# Patient Record
Sex: Male | Born: 1965
Health system: Southern US, Community
[De-identification: ages and names within clinical notes are randomized; demographics above are authoritative.]

## PROBLEM LIST (undated history)

## (undated) ENCOUNTER — Ambulatory Visit: Admission: EM | Payer: Self-pay | Source: Home / Self Care

## (undated) DIAGNOSIS — E7800 Pure hypercholesterolemia, unspecified: Secondary | ICD-10-CM

## (undated) DIAGNOSIS — I10 Essential (primary) hypertension: Secondary | ICD-10-CM

## (undated) DIAGNOSIS — D509 Iron deficiency anemia, unspecified: Secondary | ICD-10-CM

## (undated) DIAGNOSIS — R739 Hyperglycemia, unspecified: Secondary | ICD-10-CM

## (undated) DIAGNOSIS — K219 Gastro-esophageal reflux disease without esophagitis: Secondary | ICD-10-CM

## (undated) HISTORY — DX: Iron deficiency anemia, unspecified: D50.9

## (undated) HISTORY — PX: INGUINAL HERNIA REPAIR: SUR1180

## (undated) HISTORY — DX: Gastro-esophageal reflux disease without esophagitis: K21.9

## (undated) HISTORY — DX: Hyperglycemia, unspecified: R73.9

## (undated) HISTORY — DX: Essential (primary) hypertension: I10

## (undated) HISTORY — DX: Pure hypercholesterolemia, unspecified: E78.00

---

## 2004-06-30 ENCOUNTER — Emergency Department: Payer: Self-pay | Admitting: Emergency Medicine

## 2004-06-30 ENCOUNTER — Other Ambulatory Visit: Payer: Self-pay

## 2004-09-11 ENCOUNTER — Ambulatory Visit: Payer: Self-pay | Admitting: Internal Medicine

## 2004-09-19 ENCOUNTER — Ambulatory Visit: Payer: Self-pay | Admitting: Internal Medicine

## 2006-01-30 ENCOUNTER — Ambulatory Visit: Payer: Self-pay | Admitting: Rheumatology

## 2006-06-29 ENCOUNTER — Ambulatory Visit: Payer: Self-pay | Admitting: Gastroenterology

## 2011-01-24 ENCOUNTER — Ambulatory Visit: Payer: Self-pay | Admitting: Anesthesiology

## 2011-01-28 ENCOUNTER — Ambulatory Visit: Payer: Self-pay | Admitting: Surgery

## 2011-10-13 ENCOUNTER — Ambulatory Visit: Payer: Self-pay | Admitting: Anesthesiology

## 2011-10-13 LAB — POTASSIUM: Potassium: 3.7 mmol/L (ref 3.5–5.1)

## 2011-10-16 ENCOUNTER — Ambulatory Visit: Payer: Self-pay | Admitting: Orthopedic Surgery

## 2011-10-17 LAB — PATHOLOGY REPORT

## 2012-06-30 ENCOUNTER — Telehealth: Payer: Self-pay | Admitting: Internal Medicine

## 2012-06-30 DIAGNOSIS — R739 Hyperglycemia, unspecified: Secondary | ICD-10-CM

## 2012-06-30 DIAGNOSIS — D649 Anemia, unspecified: Secondary | ICD-10-CM

## 2012-06-30 DIAGNOSIS — I10 Essential (primary) hypertension: Secondary | ICD-10-CM

## 2012-06-30 DIAGNOSIS — E7800 Pure hypercholesterolemia, unspecified: Secondary | ICD-10-CM

## 2012-06-30 NOTE — Telephone Encounter (Signed)
Pt is scheduled for January to come see you but he is needing refills on Oneprazole 20 mg, Metoprolol 25 mg, Benacar 40/12.5, Atrobastatin 40 mg. Pt will need to come pick up Rx's because he uses mail order. Pt also says the he was supposed to come in November for labs. He was wondering should he come here to have those done.

## 2012-06-30 NOTE — Telephone Encounter (Signed)
Ok to refill all of these medications  (#90 with one refill).  Also i have put lab orders in and since his appt is in January - he can come in one week before his appt for labs.  Thanks.

## 2012-07-05 NOTE — Telephone Encounter (Signed)
Left message on machine at home for patient to return call, need clarification on his medications.

## 2012-07-06 MED ORDER — ATORVASTATIN CALCIUM 40 MG PO TABS: 40.0000 mg | ORAL_TABLET | Freq: Every day | ORAL | Status: DC

## 2012-07-06 MED ORDER — OLMESARTAN MEDOXOMIL-HCTZ 40-12.5 MG PO TABS: 1.0000 | ORAL_TABLET | Freq: Every day | ORAL | Status: DC

## 2012-07-06 MED ORDER — METOPROLOL TARTRATE 25 MG PO TABS: 25.0000 mg | ORAL_TABLET | Freq: Every day | ORAL | Status: DC

## 2012-07-06 NOTE — Telephone Encounter (Signed)
Spoke with patients spouse and Rx's were verified.  She will come by to pick up Rx's.

## 2012-07-06 NOTE — Telephone Encounter (Signed)
Pt's wife called back concerning medications I let her know that you called and left message about meds. She was wondering if she could get a call back.  Cell Phone [phone]

## 2012-08-19 ENCOUNTER — Other Ambulatory Visit: Payer: Self-pay | Admitting: General Practice

## 2012-08-19 NOTE — Telephone Encounter (Signed)
Pt called stating he needs all meds refilled with [facility].

## 2012-08-24 MED ORDER — METOPROLOL TARTRATE 25 MG PO TABS: 25.0000 mg | ORAL_TABLET | Freq: Every day | ORAL | Status: DC

## 2012-08-24 MED ORDER — ATORVASTATIN CALCIUM 40 MG PO TABS: 40.0000 mg | ORAL_TABLET | Freq: Every day | ORAL | Status: DC

## 2012-08-24 MED ORDER — OLMESARTAN MEDOXOMIL-HCTZ 40-12.5 MG PO TABS: 1.0000 | ORAL_TABLET | Freq: Every day | ORAL | Status: DC

## 2012-08-24 MED ORDER — OMEPRAZOLE 20 MG PO CPDR: 20.0000 mg | DELAYED_RELEASE_CAPSULE | Freq: Every day | ORAL | Status: DC

## 2012-08-24 NOTE — Addendum Note (Signed)
Addended by: Marlene Lard on: 08/24/2012 12:24 PM   Modules accepted: Orders

## 2012-08-24 NOTE — Telephone Encounter (Signed)
Sent in to pharmacy.  

## 2012-08-31 ENCOUNTER — Telehealth: Payer: Self-pay | Admitting: Internal Medicine

## 2012-08-31 NOTE — Telephone Encounter (Signed)
Patient received his metoprolol in the mail from [facility] and it is not the same medication. They called the pharmacy and the pharmacist told them that one was a time release and the other he use to take was not. The patient wife is wanting to know was something called in different. Call Quintella Baton the patients wife at [phone]

## 2012-09-01 NOTE — Telephone Encounter (Signed)
Spoke with patient regarding meds and reverified name and dosage. Different  manufacture

## 2012-10-01 ENCOUNTER — Other Ambulatory Visit (INDEPENDENT_AMBULATORY_CARE_PROVIDER_SITE_OTHER): Payer: BC Managed Care – PPO

## 2012-10-01 DIAGNOSIS — D649 Anemia, unspecified: Secondary | ICD-10-CM

## 2012-10-01 DIAGNOSIS — R7309 Other abnormal glucose: Secondary | ICD-10-CM

## 2012-10-01 DIAGNOSIS — I10 Essential (primary) hypertension: Secondary | ICD-10-CM

## 2012-10-01 DIAGNOSIS — E7800 Pure hypercholesterolemia, unspecified: Secondary | ICD-10-CM

## 2012-10-01 DIAGNOSIS — R739 Hyperglycemia, unspecified: Secondary | ICD-10-CM

## 2012-10-01 LAB — LIPID PANEL
HDL: 40.8 mg/dL (ref >39.00)
LDL Cholesterol: 103 mg/dL — ABNORMAL HIGH (ref 0–99)
Total CHOL/HDL Ratio: 4
Triglycerides: 87 mg/dL (ref 0.0–149.0)
VLDL: 17.4 mg/dL (ref 0.0–40.0)

## 2012-10-01 LAB — CBC WITH DIFFERENTIAL/PLATELET
Basophils Absolute: 0 10*3/uL (ref 0.0–0.1)
Eosinophils Relative: 2.8 % (ref 0.0–5.0)
Lymphs Abs: 2.4 10*3/uL (ref 0.7–4.0)
MCV: 89.3 fl (ref 78.0–100.0)
Monocytes Absolute: 0.6 10*3/uL (ref 0.1–1.0)
Neutrophils Relative %: 56.5 % (ref 43.0–77.0)
Platelets: 263 10*3/uL (ref 150.0–400.0)
RDW: 13.6 % (ref 11.5–14.6)
WBC: 7.5 10*3/uL (ref 4.5–10.5)

## 2012-10-01 LAB — HEPATIC FUNCTION PANEL
Alkaline Phosphatase: 70 U/L (ref 39–117)
Bilirubin, Direct: 0.1 mg/dL (ref 0.0–0.3)
Total Bilirubin: 1.1 mg/dL (ref 0.3–1.2)
Total Protein: 7.4 g/dL (ref 6.0–8.3)

## 2012-10-01 LAB — HEMOGLOBIN A1C: Hgb A1c MFr Bld: 6.8 % — ABNORMAL HIGH (ref 4.6–6.5)

## 2012-10-02 LAB — BASIC METABOLIC PANEL WITH GFR
Calcium: 9.2 mg/dL (ref 8.4–10.5)
GFR, Est African American: >89 mL/min
GFR, Est Non African American: >89 mL/min
Glucose, Bld: 104 mg/dL — ABNORMAL HIGH (ref 70–99)
Potassium: 3.8 mEq/L (ref 3.5–5.3)
Sodium: 136 mEq/L (ref 135–145)

## 2012-10-08 ENCOUNTER — Encounter: Payer: Self-pay | Admitting: Internal Medicine

## 2012-10-08 ENCOUNTER — Ambulatory Visit (INDEPENDENT_AMBULATORY_CARE_PROVIDER_SITE_OTHER): Payer: BC Managed Care – PPO | Admitting: Internal Medicine

## 2012-10-08 VITALS — BP 110/70 | HR 68 | Temp 98.7°F | Ht 69.5 in | Wt 240.8 lb

## 2012-10-08 DIAGNOSIS — E119 Type 2 diabetes mellitus without complications: Secondary | ICD-10-CM | POA: Insufficient documentation

## 2012-10-08 DIAGNOSIS — E7800 Pure hypercholesterolemia, unspecified: Secondary | ICD-10-CM

## 2012-10-08 DIAGNOSIS — R739 Hyperglycemia, unspecified: Secondary | ICD-10-CM

## 2012-10-08 DIAGNOSIS — D649 Anemia, unspecified: Secondary | ICD-10-CM

## 2012-10-08 DIAGNOSIS — R7309 Other abnormal glucose: Secondary | ICD-10-CM

## 2012-10-08 DIAGNOSIS — I10 Essential (primary) hypertension: Secondary | ICD-10-CM

## 2012-10-10 ENCOUNTER — Encounter: Payer: Self-pay | Admitting: Internal Medicine

## 2012-10-10 NOTE — Assessment & Plan Note (Signed)
Low cholesterol diet and exercise.  Continue lipitor.  Follow lipid profile and liver function.  Cholesterol just checked and revealed total cholesterol 161, triglycerides 87, HDL 41 and LDL 103.

## 2012-10-10 NOTE — Assessment & Plan Note (Signed)
Hgb just checked and wnl.  EGD 10/30/05 normal.  Colonoscopy 10/30/05 normal.  Follow.    

## 2012-10-10 NOTE — Assessment & Plan Note (Signed)
A1c just checked - 6.8.  Discussed at length with him today.  Discussed diet, exercise and weight loss.  Hold on medication.  Follow.  Check metabolic panel and a1c as well as urine microalbumin/cr ratio next labs.

## 2012-10-10 NOTE — Assessment & Plan Note (Signed)
Blood pressure has been under good control.  Same meds.  Follow.  Recent metabolic panel wnl.   

## 2012-10-10 NOTE — Progress Notes (Signed)
  Subjective:    Patient ID: Aaron Allison, male    DOB: [birth year], 47 y.o.   MRN: [id number]  HPI 47 year old male with past history of hypertension, diabetes, hypercholesterolemia and iron deficient anemia who comes in today to follow up on these issues as well as for a complete physical exam.  He states he has been doing well.  Had some issues with the ganglion cyst - right wrist.  No problem now.  Following.  Did see Dr Lonna Cobb.  Had prostate biopsy.  Negative.  He is following his prostate and PSA.  Has follow up planned in 4/14.  No chest pain or tightness.  Reflux controlled if he takes his meds and watches what he eats.  Overall he feels he is doing well.    Past Medical History  Diagnosis Date  . Hypertension   . Hypercholesterolemia   . Iron deficiency anemia   . Hyperglycemia   . GERD (gastroesophageal reflux disease)     Current Outpatient Prescriptions on File Prior to Visit  Medication Sig Dispense Refill  . atorvastatin (LIPITOR) 40 MG tablet Take 1 tablet (40 mg total) by mouth daily.  90 tablet  1  . ferrous sulfate 325 (65 FE) MG tablet Take 325 mg by mouth daily.      . metoprolol succinate (TOPROL-XL) 25 MG 24 hr tablet Take 25 mg by mouth daily.      Marland Kitchen olmesartan-hydrochlorothiazide (BENICAR HCT) 40-12.5 MG per tablet Take 1 tablet by mouth daily.  90 tablet  1  . omeprazole (PRILOSEC) 20 MG capsule Take 1 capsule (20 mg total) by mouth daily.  90 capsule  1    Review of Systems Patient denies any headache, lightheadedness or dizziness.  No sinus or allergy symptoms.   No chest pain, tightness or palpitations.  No increased shortness of breath, cough or congestion.  No nausea or vomiting. Acid reflux controlled as outlined.  No abdominal pain or cramping.  No bowel change, such as diarrhea, constipation, BRBPR or melana.  No urine change.        Objective:   Physical Exam Filed Vitals:   10/08/12 1327  BP: 110/70  Pulse: 68  Temp: 98.7 F (69.71 C)   47 year old  male in no acute distress.  HEENT:  Nares - clear.  Oropharynx - without lesions. NECK:  Supple.  Nontender.  No audible carotid bruit.  HEART:  Appears to be regular.   LUNGS:  No crackles or wheezing audible.  Respirations even and unlabored.   RADIAL PULSE:  Equal bilaterally.  ABDOMEN:  Soft.  Nontender.  Bowel sounds present and normal.  No audible abdominal bruit.  GU:  Performed through urology.    EXTREMITIES:  No increased edema present.  DP pulses palpable and equal bilaterally.         Assessment & Plan:  GANGLION CYST.  Stable currently.  Follow.    ELEVATED PSA.  Biopsy negative.  Seeing Dr Lonna Cobb.  Has follow up in 4/14.   CARDIOVASCULAR.  Asymptomatic.    HEALTH MAINTENANCE.  Physical today.  Prostate and PSAs through Dr Lonna Cobb.  Colonoscopy 10/30/05 normal.

## 2013-03-01 ENCOUNTER — Other Ambulatory Visit: Payer: Self-pay | Admitting: *Deleted

## 2013-03-01 MED ORDER — METOPROLOL SUCCINATE ER 25 MG PO TB24: 25.0000 mg | ORAL_TABLET | Freq: Every day | ORAL | Status: DC

## 2013-03-01 MED ORDER — ATORVASTATIN CALCIUM 40 MG PO TABS: 40.0000 mg | ORAL_TABLET | Freq: Every day | ORAL | Status: DC

## 2013-03-01 MED ORDER — OMEPRAZOLE 20 MG PO CPDR: 20.0000 mg | DELAYED_RELEASE_CAPSULE | Freq: Every day | ORAL | Status: DC

## 2013-03-02 ENCOUNTER — Other Ambulatory Visit: Payer: Self-pay | Admitting: *Deleted

## 2013-03-02 MED ORDER — OLMESARTAN MEDOXOMIL-HCTZ 40-12.5 MG PO TABS: 1.0000 | ORAL_TABLET | Freq: Every day | ORAL | Status: DC

## 2013-03-30 ENCOUNTER — Other Ambulatory Visit (INDEPENDENT_AMBULATORY_CARE_PROVIDER_SITE_OTHER): Payer: BC Managed Care – PPO

## 2013-03-30 DIAGNOSIS — E7800 Pure hypercholesterolemia, unspecified: Secondary | ICD-10-CM

## 2013-03-30 DIAGNOSIS — R739 Hyperglycemia, unspecified: Secondary | ICD-10-CM

## 2013-03-30 DIAGNOSIS — R7309 Other abnormal glucose: Secondary | ICD-10-CM

## 2013-03-30 DIAGNOSIS — I10 Essential (primary) hypertension: Secondary | ICD-10-CM

## 2013-03-30 LAB — HEPATIC FUNCTION PANEL
ALT: 26 U/L (ref 0–53)
AST: 23 U/L (ref 0–37)
Total Bilirubin: 0.9 mg/dL (ref 0.3–1.2)
Total Protein: 7.7 g/dL (ref 6.0–8.3)

## 2013-03-30 LAB — LIPID PANEL
Cholesterol: 146 mg/dL (ref 0–200)
HDL: 39 mg/dL — ABNORMAL LOW (ref >39.00)
Triglycerides: 136 mg/dL (ref 0.0–149.0)
VLDL: 27.2 mg/dL (ref 0.0–40.0)

## 2013-03-30 LAB — BASIC METABOLIC PANEL
Chloride: 104 mEq/L (ref 96–112)
GFR: 77.59 mL/min (ref >60.00)
Potassium: 4.2 mEq/L (ref 3.5–5.1)
Sodium: 141 mEq/L (ref 135–145)

## 2013-03-30 LAB — HEMOGLOBIN A1C: Hgb A1c MFr Bld: 6.8 % — ABNORMAL HIGH (ref 4.6–6.5)

## 2013-04-11 ENCOUNTER — Encounter: Payer: Self-pay | Admitting: Internal Medicine

## 2013-04-11 ENCOUNTER — Ambulatory Visit (INDEPENDENT_AMBULATORY_CARE_PROVIDER_SITE_OTHER): Payer: BC Managed Care – PPO | Admitting: Internal Medicine

## 2013-04-11 VITALS — BP 120/80 | HR 82 | Temp 98.7°F | Ht 69.5 in | Wt 244.0 lb

## 2013-04-11 DIAGNOSIS — I10 Essential (primary) hypertension: Secondary | ICD-10-CM

## 2013-04-11 DIAGNOSIS — D649 Anemia, unspecified: Secondary | ICD-10-CM

## 2013-04-11 DIAGNOSIS — E119 Type 2 diabetes mellitus without complications: Secondary | ICD-10-CM

## 2013-04-11 DIAGNOSIS — E7800 Pure hypercholesterolemia, unspecified: Secondary | ICD-10-CM

## 2013-04-11 MED ORDER — METOPROLOL SUCCINATE ER 25 MG PO TB24: 25.0000 mg | ORAL_TABLET | Freq: Every day | ORAL | Status: DC

## 2013-04-11 MED ORDER — OLMESARTAN MEDOXOMIL-HCTZ 40-12.5 MG PO TABS: 1.0000 | ORAL_TABLET | Freq: Every day | ORAL | Status: DC

## 2013-04-11 MED ORDER — OMEPRAZOLE 20 MG PO CPDR: 20.0000 mg | DELAYED_RELEASE_CAPSULE | Freq: Every day | ORAL | Status: DC

## 2013-04-11 MED ORDER — ATORVASTATIN CALCIUM 40 MG PO TABS: 40.0000 mg | ORAL_TABLET | Freq: Every day | ORAL | Status: DC

## 2013-04-13 ENCOUNTER — Encounter: Payer: Self-pay | Admitting: Internal Medicine

## 2013-04-13 NOTE — Assessment & Plan Note (Addendum)
A1c just checked - 6.8.  Discussed at length with him today.  Discussed diet, exercise and weight loss.  Hold on medication.  Follow.

## 2013-04-13 NOTE — Progress Notes (Signed)
Subjective:    Patient ID: Aaron Allison, male    DOB: 31-Dec-1965, 47 y.o.   MRN: [id number]  HPI 47 year old male with past history of hypertension, diabetes, hypercholesterolemia and iron deficient anemia who comes in today for a scheduled follow up.  He states he has been doing well.  Had some issues with the ganglion cyst - right wrist.  No problem now.  Following.  Did see Dr Lonna Cobb.  Had prostate biopsy.  Negative.  He is following his prostate and PSA.   No chest pain or tightness.  Reflux controlled if he takes his meds and watches what he eats.  Overall he feels he is doing well.     Past Medical History  Diagnosis Date  . Hypertension   . Hypercholesterolemia   . Iron deficiency anemia   . Hyperglycemia   . GERD (gastroesophageal reflux disease)     Outpatient Encounter Prescriptions as of 04/11/2013  Medication Sig Dispense Refill  . aspirin 81 MG tablet Take 81 mg by mouth daily.      Marland Kitchen atorvastatin (LIPITOR) 40 MG tablet Take 1 tablet (40 mg total) by mouth daily.  90 tablet  3  . ferrous sulfate 325 (65 FE) MG tablet Take 325 mg by mouth daily.      . metoprolol succinate (TOPROL-XL) 25 MG 24 hr tablet Take 1 tablet (25 mg total) by mouth daily.  90 tablet  3  . olmesartan-hydrochlorothiazide (BENICAR HCT) 40-12.5 MG per tablet Take 1 tablet by mouth daily.  90 tablet  3  . omeprazole (PRILOSEC) 20 MG capsule Take 1 capsule (20 mg total) by mouth daily.  90 capsule  3  . [DISCONTINUED] atorvastatin (LIPITOR) 40 MG tablet Take 1 tablet (40 mg total) by mouth daily.  90 tablet  0  . [DISCONTINUED] metoprolol succinate (TOPROL-XL) 25 MG 24 hr tablet Take 1 tablet (25 mg total) by mouth daily.  90 tablet  0  . [DISCONTINUED] olmesartan-hydrochlorothiazide (BENICAR HCT) 40-12.5 MG per tablet Take 1 tablet by mouth daily.  90 tablet  1  . [DISCONTINUED] omeprazole (PRILOSEC) 20 MG capsule Take 1 capsule (20 mg total) by mouth daily.  90 capsule  0  . [DISCONTINUED] fish oil-omega-3  fatty acids 1000 MG capsule Take 2 g by mouth daily.       No facility-administered encounter medications on file as of 04/11/2013.     Review of Systems Patient denies any headache, lightheadedness or dizziness.  No sinus or allergy symptoms.   No chest pain, tightness or palpitations.  No increased shortness of breath, cough or congestion.  No nausea or vomiting. Acid reflux controlled as outlined.  No abdominal pain or cramping.  No bowel change, such as diarrhea, constipation, BRBPR or melana.  No urine change.        Objective:   Physical Exam  Filed Vitals:   04/11/13 1618  BP: 120/80  Pulse: 82  Temp: 98.7 F (54.28 C)   47 year old male in no acute distress.  HEENT:  Nares - clear.  Oropharynx - without lesions. NECK:  Supple.  Nontender.  No audible carotid bruit.  HEART:  Appears to be regular.   LUNGS:  No crackles or wheezing audible.  Respirations even and unlabored.   RADIAL PULSE:  Equal bilaterally.  ABDOMEN:  Soft.  Nontender.  Bowel sounds present and normal.  No audible abdominal bruit.     EXTREMITIES:  No increased edema present.  DP pulses palpable and  equal bilaterally.         Assessment & Plan:  GANGLION CYST.  Stable currently.  Follow.    ELEVATED PSA.  Biopsy negative.  Seeing Dr Lonna Cobb.    CARDIOVASCULAR.  Asymptomatic.    HEALTH MAINTENANCE.  Physical 10/08/12.   Prostate and PSAs through Dr Lonna Cobb.  Colonoscopy 10/30/05 normal.

## 2013-04-13 NOTE — Assessment & Plan Note (Signed)
Blood pressure has been under good control.  Same meds.  Follow.  Recent metabolic panel wnl.   

## 2013-04-13 NOTE — Assessment & Plan Note (Signed)
Low cholesterol diet and exercise.  Continue lipitor.  Follow lipid profile and liver function.  Cholesterol just checked and revealed total cholesterol 146, triglycerides1367, HDL 39 and LDL 80.

## 2013-04-13 NOTE — Assessment & Plan Note (Signed)
Hgb just checked and wnl.  EGD 10/30/05 normal.  Colonoscopy 10/30/05 normal.  Follow.    

## 2013-04-21 ENCOUNTER — Encounter: Payer: Self-pay | Admitting: Internal Medicine

## 2013-05-06 ENCOUNTER — Encounter: Payer: Self-pay | Admitting: *Deleted

## 2013-09-28 ENCOUNTER — Other Ambulatory Visit (INDEPENDENT_AMBULATORY_CARE_PROVIDER_SITE_OTHER): Payer: BC Managed Care – PPO

## 2013-09-28 DIAGNOSIS — E7800 Pure hypercholesterolemia, unspecified: Secondary | ICD-10-CM

## 2013-09-28 DIAGNOSIS — D649 Anemia, unspecified: Secondary | ICD-10-CM

## 2013-09-28 DIAGNOSIS — E119 Type 2 diabetes mellitus without complications: Secondary | ICD-10-CM

## 2013-09-28 LAB — CBC WITH DIFFERENTIAL/PLATELET
Basophils Relative: 0.5 % (ref 0.0–3.0)
Eosinophils Absolute: 0.2 10*3/uL (ref 0.0–0.7)
Eosinophils Relative: 3.2 % (ref 0.0–5.0)
Hemoglobin: 14.4 g/dL (ref 13.0–17.0)
Lymphocytes Relative: 30.5 % (ref 12.0–46.0)
Lymphs Abs: 2.3 10*3/uL (ref 0.7–4.0)
MCHC: 33.8 g/dL (ref 30.0–36.0)
Monocytes Relative: 6.8 % (ref 3.0–12.0)
Neutro Abs: 4.5 10*3/uL (ref 1.4–7.7)
Neutrophils Relative %: 59 % (ref 43.0–77.0)
RBC: 4.79 Mil/uL (ref 4.22–5.81)
WBC: 7.7 10*3/uL (ref 4.5–10.5)

## 2013-09-28 LAB — LIPID PANEL
Cholesterol: 157 mg/dL (ref 0–200)
HDL: 37.1 mg/dL — ABNORMAL LOW (ref >39.00)
Triglycerides: 145 mg/dL (ref 0.0–149.0)

## 2013-09-28 LAB — HEMOGLOBIN A1C: Hgb A1c MFr Bld: 6.9 % — ABNORMAL HIGH (ref 4.6–6.5)

## 2013-09-28 LAB — BASIC METABOLIC PANEL
CO2: 29 mEq/L (ref 19–32)
Calcium: 9.1 mg/dL (ref 8.4–10.5)
Creatinine, Ser: 1 mg/dL (ref 0.4–1.5)
GFR: 83.65 mL/min (ref >60.00)
Sodium: 140 mEq/L (ref 135–145)

## 2013-09-28 LAB — HEPATIC FUNCTION PANEL
ALT: 32 U/L (ref 0–53)
AST: 25 U/L (ref 0–37)
Albumin: 3.9 g/dL (ref 3.5–5.2)
Alkaline Phosphatase: 60 U/L (ref 39–117)

## 2013-09-30 ENCOUNTER — Encounter: Payer: Self-pay | Admitting: *Deleted

## 2013-10-07 ENCOUNTER — Other Ambulatory Visit: Payer: BC Managed Care – PPO

## 2013-10-14 ENCOUNTER — Encounter (INDEPENDENT_AMBULATORY_CARE_PROVIDER_SITE_OTHER): Payer: Self-pay

## 2013-10-14 ENCOUNTER — Encounter: Payer: Self-pay | Admitting: Internal Medicine

## 2013-10-14 ENCOUNTER — Ambulatory Visit (INDEPENDENT_AMBULATORY_CARE_PROVIDER_SITE_OTHER): Payer: BC Managed Care – PPO | Admitting: Internal Medicine

## 2013-10-14 VITALS — BP 122/80 | HR 98 | Temp 98.5°F | Ht 69.5 in | Wt 242.8 lb

## 2013-10-14 DIAGNOSIS — Z125 Encounter for screening for malignant neoplasm of prostate: Secondary | ICD-10-CM

## 2013-10-14 DIAGNOSIS — D649 Anemia, unspecified: Secondary | ICD-10-CM

## 2013-10-14 DIAGNOSIS — E7800 Pure hypercholesterolemia, unspecified: Secondary | ICD-10-CM

## 2013-10-14 DIAGNOSIS — I10 Essential (primary) hypertension: Secondary | ICD-10-CM

## 2013-10-14 DIAGNOSIS — E119 Type 2 diabetes mellitus without complications: Secondary | ICD-10-CM

## 2013-10-14 MED ORDER — OMEPRAZOLE 20 MG PO CPDR: 20.0000 mg | DELAYED_RELEASE_CAPSULE | Freq: Every day | ORAL | Status: DC

## 2013-10-14 MED ORDER — OLMESARTAN MEDOXOMIL-HCTZ 40-12.5 MG PO TABS: 1.0000 | ORAL_TABLET | Freq: Every day | ORAL | Status: DC

## 2013-10-14 MED ORDER — METOPROLOL SUCCINATE ER 25 MG PO TB24: 25.0000 mg | ORAL_TABLET | Freq: Every day | ORAL | Status: DC

## 2013-10-14 MED ORDER — ATORVASTATIN CALCIUM 40 MG PO TABS: 40.0000 mg | ORAL_TABLET | Freq: Every day | ORAL | Status: DC

## 2013-10-14 NOTE — Progress Notes (Signed)
Pre-visit discussion using our clinic review tool. No additional management support is needed unless otherwise documented below in the visit note.  

## 2013-10-15 LAB — PSA: PSA: 1.56 ng/mL (ref <=4.00)

## 2013-10-16 ENCOUNTER — Encounter: Payer: Self-pay | Admitting: Internal Medicine

## 2013-10-16 NOTE — Assessment & Plan Note (Signed)
Hgb just checked and wnl.  EGD 10/30/05 normal.  Colonoscopy 10/30/05 normal.  Follow.

## 2013-10-16 NOTE — Assessment & Plan Note (Signed)
Low cholesterol diet and exercise.  Continue lipitor.  Follow lipid profile and liver function.    

## 2013-10-16 NOTE — Assessment & Plan Note (Signed)
Blood pressure has been under good control.  Same meds.  Follow.  Recent metabolic panel wnl.

## 2013-10-16 NOTE — Progress Notes (Signed)
Subjective:    Patient ID: Aaron Allison, male    DOB: [birth year], 48 y.o.   MRN: [id number]  HPI 48 year old male with past history of hypertension, diabetes, hypercholesterolemia and iron deficient anemia who comes in today to follow up on these issues as well as for a complete physical exam.   He states he has been doing well.  Had some issues with the ganglion cyst - right wrist.  No problem now.  Following.  Did see Dr Lonna Cobb.  Had prostate biopsy.  Negative.  He was following his prostate and PSA.   He has not returned for prostate check.  No chest pain or tightness.  Reflux controlled if he takes his meds and watches what he eats.  Overall he feels he is doing well.  Recent a1c increasing gradually.  Last check 6.9.  Since finding out about his labs, he has been doing better watching what he eats.  Started exercising again.      Past Medical History  Diagnosis Date  . Hypertension   . Hypercholesterolemia   . Iron deficiency anemia   . Hyperglycemia   . GERD (gastroesophageal reflux disease)     Outpatient Encounter Prescriptions as of 10/14/2013  Medication Sig  . aspirin 81 MG tablet Take 81 mg by mouth daily.  Marland Kitchen atorvastatin (LIPITOR) 40 MG tablet Take 1 tablet (40 mg total) by mouth daily.  . ferrous sulfate 325 (65 FE) MG tablet Take 325 mg by mouth daily.  . metoprolol succinate (TOPROL-XL) 25 MG 24 hr tablet Take 1 tablet (25 mg total) by mouth daily.  Marland Kitchen olmesartan-hydrochlorothiazide (BENICAR HCT) 40-12.5 MG per tablet Take 1 tablet by mouth daily.  Marland Kitchen omeprazole (PRILOSEC) 20 MG capsule Take 1 capsule (20 mg total) by mouth daily.  . [DISCONTINUED] atorvastatin (LIPITOR) 40 MG tablet Take 1 tablet (40 mg total) by mouth daily.  . [DISCONTINUED] atorvastatin (LIPITOR) 40 MG tablet Take 1 tablet (40 mg total) by mouth daily.  . [DISCONTINUED] metoprolol succinate (TOPROL-XL) 25 MG 24 hr tablet Take 1 tablet (25 mg total) by mouth daily.  . [DISCONTINUED] metoprolol succinate  (TOPROL-XL) 25 MG 24 hr tablet Take 1 tablet (25 mg total) by mouth daily.  . [DISCONTINUED] olmesartan-hydrochlorothiazide (BENICAR HCT) 40-12.5 MG per tablet Take 1 tablet by mouth daily.  . [DISCONTINUED] olmesartan-hydrochlorothiazide (BENICAR HCT) 40-12.5 MG per tablet Take 1 tablet by mouth daily.  . [DISCONTINUED] omeprazole (PRILOSEC) 20 MG capsule Take 1 capsule (20 mg total) by mouth daily.  . [DISCONTINUED] omeprazole (PRILOSEC) 20 MG capsule Take 1 capsule (20 mg total) by mouth daily.     Review of Systems Patient denies any headache, lightheadedness or dizziness.  No sinus or allergy symptoms.   No chest pain, tightness or palpitations.  No increased shortness of breath, cough or congestion.  No nausea or vomiting. Acid reflux controlled as outlined.  No abdominal pain or cramping.  No bowel change, such as diarrhea, constipation, BRBPR or melana.  No urine change.  Am sugars averaging 130-140 and pm sugars averaging 140-150s.        Objective:   Physical Exam  Filed Vitals:   10/14/13 1522  BP: 122/80  Pulse: 98  Temp: 98.5 F (36.9 C)   Blood pressure recheck:  47-57/22  48 year old male in no acute distress.  HEENT:  Nares - clear.  Oropharynx - without lesions. NECK:  Supple.  Nontender.  No audible carotid bruit.  HEART:  Appears to be regular.  LUNGS:  No crackles or wheezing audible.  Respirations even and unlabored.   RADIAL PULSE:  Equal bilaterally.  ABDOMEN:  Soft.  Nontender.  Bowel sounds present and normal.  No audible abdominal bruit.  GU:  Normal descended testicles.  No palpable testicular nodules.   RECTAL:  Could not appreciate any palpable prostate nodules.  Heme negative.   EXTREMITIES:  No increased edema present.  DP pulses palpable and equal bilaterally.         Assessment & Plan:  GANGLION CYST.  Stable currently.  Follow.    ELEVATED PSA.  Biopsy negative.  Was seeing Dr Lonna CobbStoioff.  Recheck psa today.  Exam as outlined.     CARDIOVASCULAR.  Asymptomatic.    HEALTH MAINTENANCE.  Physical today.   Prostate and PSAs through Dr Lonna CobbStoioff previously.  Recheck here today.  Colonoscopy 10/30/05 normal.

## 2013-10-16 NOTE — Assessment & Plan Note (Signed)
A1c just checked - 6.9.  Discussed at length with him today.  Discussed diet, exercise and weight loss.  Hold on medication.  Follow.  Had has already adjusted his diet and is back exercising.  Sugars attached.  Follow.

## 2013-10-17 ENCOUNTER — Encounter: Payer: Self-pay | Admitting: *Deleted

## 2013-10-20 ENCOUNTER — Telehealth: Payer: Self-pay | Admitting: Internal Medicine

## 2013-10-20 NOTE — Telephone Encounter (Signed)
Mailed information handout to pt address.

## 2013-10-20 NOTE — Telephone Encounter (Signed)
Pt was seen last week and discussed blood sugars.  States Dr. Lorin PicketScott was going to give him paperwork on what to avoid but forgot.  Please mail to pt.

## 2013-12-28 ENCOUNTER — Telehealth: Payer: Self-pay | Admitting: Internal Medicine

## 2013-12-28 ENCOUNTER — Other Ambulatory Visit: Payer: Self-pay | Admitting: *Deleted

## 2013-12-28 NOTE — Telephone Encounter (Signed)
Need more information on patient. This is Mr. Aaron Allison chart

## 2013-12-28 NOTE — Telephone Encounter (Signed)
Spouse stated ms Clipper needs rx for lancets [facility] [address]

## 2013-12-29 MED ORDER — ACCU-CHEK SOFT TOUCH LANCETS MISC: Status: DC

## 2013-12-29 NOTE — Telephone Encounter (Signed)
Ms Aaron Allison came in stating her husband Mr Aaron Allison Needed rx for lancets

## 2013-12-29 NOTE — Telephone Encounter (Signed)
Rx sent to pharmacy by [facility]  

## 2014-02-09 ENCOUNTER — Other Ambulatory Visit: Payer: BC Managed Care – PPO

## 2014-02-13 ENCOUNTER — Other Ambulatory Visit: Payer: BC Managed Care – PPO

## 2014-02-13 DIAGNOSIS — E119 Type 2 diabetes mellitus without complications: Secondary | ICD-10-CM

## 2014-02-13 LAB — HEPATIC FUNCTION PANEL
ALT: 21 U/L (ref 0–53)
AST: 21 U/L (ref 0–37)
Albumin: 3.7 g/dL (ref 3.5–5.2)
Alkaline Phosphatase: 61 U/L (ref 39–117)
Bilirubin, Direct: 0.2 mg/dL (ref 0.0–0.3)
TOTAL PROTEIN: 6.7 g/dL (ref 6.0–8.3)
Total Bilirubin: 0.9 mg/dL (ref 0.2–1.2)

## 2014-02-13 LAB — TSH: TSH: 2.96 u[IU]/mL (ref 0.35–4.50)

## 2014-02-13 LAB — LIPID PANEL
Cholesterol: 124 mg/dL (ref 0–200)
HDL: 39 mg/dL — AB (ref >39.00)
LDL Cholesterol: 69 mg/dL (ref 0–99)
TRIGLYCERIDES: 78 mg/dL (ref 0.0–149.0)
Total CHOL/HDL Ratio: 3
VLDL: 15.6 mg/dL (ref 0.0–40.0)

## 2014-02-13 LAB — BASIC METABOLIC PANEL
BUN: 19 mg/dL (ref 6–23)
CHLORIDE: 103 meq/L (ref 96–112)
CO2: 28 mEq/L (ref 19–32)
Calcium: 8.8 mg/dL (ref 8.4–10.5)
Creatinine, Ser: 1 mg/dL (ref 0.4–1.5)
GFR: 86.48 mL/min (ref >60.00)
Glucose, Bld: 111 mg/dL — ABNORMAL HIGH (ref 70–99)
POTASSIUM: 4.1 meq/L (ref 3.5–5.1)
SODIUM: 138 meq/L (ref 135–145)

## 2014-02-13 LAB — MICROALBUMIN / CREATININE URINE RATIO
CREATININE, U: 272.3 mg/dL
MICROALB/CREAT RATIO: 1.2 mg/g (ref 0.0–30.0)
Microalb, Ur: 3.2 mg/dL — ABNORMAL HIGH (ref 0.0–1.9)

## 2014-02-13 LAB — HEMOGLOBIN A1C: HEMOGLOBIN A1C: 6.5 % (ref 4.6–6.5)

## 2014-02-14 ENCOUNTER — Ambulatory Visit: Payer: BC Managed Care – PPO | Admitting: Internal Medicine

## 2014-02-17 ENCOUNTER — Other Ambulatory Visit: Payer: BC Managed Care – PPO

## 2014-02-21 ENCOUNTER — Encounter (INDEPENDENT_AMBULATORY_CARE_PROVIDER_SITE_OTHER): Payer: Self-pay

## 2014-02-21 ENCOUNTER — Ambulatory Visit (INDEPENDENT_AMBULATORY_CARE_PROVIDER_SITE_OTHER): Payer: BC Managed Care – PPO | Admitting: Internal Medicine

## 2014-02-21 ENCOUNTER — Encounter: Payer: Self-pay | Admitting: Internal Medicine

## 2014-02-21 VITALS — BP 110/70 | HR 81 | Temp 98.4°F | Ht 69.5 in | Wt 237.2 lb

## 2014-02-21 DIAGNOSIS — E119 Type 2 diabetes mellitus without complications: Secondary | ICD-10-CM

## 2014-02-21 DIAGNOSIS — I10 Essential (primary) hypertension: Secondary | ICD-10-CM

## 2014-02-21 DIAGNOSIS — D649 Anemia, unspecified: Secondary | ICD-10-CM

## 2014-02-21 DIAGNOSIS — N400 Enlarged prostate without lower urinary tract symptoms: Secondary | ICD-10-CM

## 2014-02-21 DIAGNOSIS — E7800 Pure hypercholesterolemia, unspecified: Secondary | ICD-10-CM

## 2014-02-21 NOTE — Progress Notes (Signed)
Pre visit review using our clinic review tool, if applicable. No additional management support is needed unless otherwise documented below in the visit note. 

## 2014-02-26 ENCOUNTER — Encounter: Payer: Self-pay | Admitting: Internal Medicine

## 2014-02-26 DIAGNOSIS — N400 Enlarged prostate without lower urinary tract symptoms: Secondary | ICD-10-CM | POA: Insufficient documentation

## 2014-02-26 MED ORDER — OLMESARTAN MEDOXOMIL-HCTZ 40-12.5 MG PO TABS: 1.0000 | ORAL_TABLET | Freq: Every day | ORAL | Status: DC

## 2014-02-26 MED ORDER — METOPROLOL SUCCINATE ER 25 MG PO TB24
25.0000 mg | ORAL_TABLET | Freq: Every day | ORAL | Status: DC
Start: 2014-02-26 — End: 2014-12-29

## 2014-02-26 MED ORDER — OMEPRAZOLE 20 MG PO CPDR: 20.0000 mg | DELAYED_RELEASE_CAPSULE | Freq: Every day | ORAL | Status: DC

## 2014-02-26 MED ORDER — ATORVASTATIN CALCIUM 40 MG PO TABS
40.0000 mg | ORAL_TABLET | Freq: Every day | ORAL | Status: DC
Start: 2014-02-26 — End: 2014-12-25

## 2014-02-26 NOTE — Assessment & Plan Note (Signed)
Low cholesterol diet and exercise.  Continue lipitor.  Follow lipid profile and liver function.

## 2014-02-26 NOTE — Assessment & Plan Note (Signed)
Hgb on last check wnl.  EGD 10/30/05 normal.  Colonoscopy 10/30/05 normal.  Follow.  Check cbc and ferritin with next labs.

## 2014-02-26 NOTE — Assessment & Plan Note (Signed)
Blood pressure has been under good control.  Same meds.  Follow.  Follow metabolic panel.

## 2014-02-26 NOTE — Assessment & Plan Note (Signed)
Was evaluated by Dr Lonna Cobb.  Is s/p biopsy.  States everything checked out fine.  Follow up PSA 10/14/13 - 1.56.  Follow.

## 2014-02-26 NOTE — Progress Notes (Signed)
  Subjective:    Patient ID: Aaron Allison, male    DOB: 1965-12-22, 48 y.o.   MRN: [id number]  HPI 48 year old male with past history of hypertension, diabetes, hypercholesterolemia and iron deficient anemia who comes in today for a scheduled follow up.   He states he has been doing well.  Had some issues with the ganglion cyst - right wrist.  No problem now.  Following.  Did see Dr Lonna Cobb.  Had prostate biopsy.  Negative.  He was following his prostate and PSA.   No longer being followed there.  No chest pain or tightness.  Reflux controlled.  Has adjusted his diet.  Watching what he eats.  Has lost weight.  Feels better.  Overall he feels he is doing well.       Past Medical History  Diagnosis Date  . Hypertension   . Hypercholesterolemia   . Iron deficiency anemia   . Hyperglycemia   . GERD (gastroesophageal reflux disease)     Outpatient Encounter Prescriptions as of 02/21/2014  Medication Sig  . aspirin 81 MG tablet Take 81 mg by mouth daily.  Marland Kitchen atorvastatin (LIPITOR) 40 MG tablet Take 1 tablet (40 mg total) by mouth daily.  . ferrous sulfate 325 (65 FE) MG tablet Take 325 mg by mouth daily.  . Lancets (ACCU-CHEK SOFT TOUCH) lancets Use as instructed  . metoprolol succinate (TOPROL-XL) 25 MG 24 hr tablet Take 1 tablet (25 mg total) by mouth daily.  Marland Kitchen olmesartan-hydrochlorothiazide (BENICAR HCT) 40-12.5 MG per tablet Take 1 tablet by mouth daily.  Marland Kitchen omeprazole (PRILOSEC) 20 MG capsule Take 1 capsule (20 mg total) by mouth daily.     Review of Systems Patient denies any headache, lightheadedness or dizziness.  No sinus or allergy symptoms.   No chest pain, tightness or palpitations.  No increased shortness of breath, cough or congestion.  No nausea or vomiting.  Acid reflux controlled as outlined.  No abdominal pain or cramping.  No bowel change, such as diarrhea, constipation, BRBPR or melana.  No urine change.  Am sugars averaging 117-130s and pm sugars averaging 120-150.  Has adjusted  his diet and lost weight.  Feels better.        Objective:   Physical Exam  Filed Vitals:   02/21/14 1606  BP: 110/70  Pulse: 81  Temp: 98.4 F (36.9 C)   Blood pressure recheck:  76/90  48 year old male in no acute distress.  HEENT:  Nares - clear.  Oropharynx - without lesions. NECK:  Supple.  Nontender.  No audible carotid bruit.  HEART:  Appears to be regular.   LUNGS:  No crackles or wheezing audible.  Respirations even and unlabored.   RADIAL PULSE:  Equal bilaterally.  ABDOMEN:  Soft.  Nontender.  Bowel sounds present and normal.  No audible abdominal bruit.   EXTREMITIES:  No increased edema present.  DP pulses palpable and equal bilaterally.         Assessment & Plan:  GANGLION CYST.  Stable currently.  Follow.    ELEVATED PSA.  Biopsy negative.  Was seeing Dr Lonna Cobb.  Follow PSA.    CARDIOVASCULAR.  Asymptomatic.    HEALTH MAINTENANCE.  Physical 10/14/13.   Prostate and PSAs through Dr Lonna Cobb previously.  Follow PSA.  Colonoscopy 10/30/05 normal.

## 2014-02-26 NOTE — Assessment & Plan Note (Signed)
Have discussed diet, exercise and weight loss.  Not on medication.  He has adjusted his diet.  Losing weight.  Feels better.  Sugars attached.  Follow.

## 2014-06-19 ENCOUNTER — Other Ambulatory Visit (INDEPENDENT_AMBULATORY_CARE_PROVIDER_SITE_OTHER): Payer: BC Managed Care – PPO

## 2014-06-19 DIAGNOSIS — N400 Enlarged prostate without lower urinary tract symptoms: Secondary | ICD-10-CM

## 2014-06-19 DIAGNOSIS — D649 Anemia, unspecified: Secondary | ICD-10-CM

## 2014-06-19 DIAGNOSIS — E119 Type 2 diabetes mellitus without complications: Secondary | ICD-10-CM

## 2014-06-19 DIAGNOSIS — E7800 Pure hypercholesterolemia, unspecified: Secondary | ICD-10-CM

## 2014-06-19 LAB — CBC WITH DIFFERENTIAL/PLATELET
BASOS PCT: 0.3 % (ref 0.0–3.0)
Basophils Absolute: 0 10*3/uL (ref 0.0–0.1)
EOS PCT: 5.2 % — AB (ref 0.0–5.0)
Eosinophils Absolute: 0.4 10*3/uL (ref 0.0–0.7)
HCT: 43 % (ref 39.0–52.0)
HEMOGLOBIN: 14.4 g/dL (ref 13.0–17.0)
LYMPHS PCT: 28.7 % (ref 12.0–46.0)
Lymphs Abs: 2.3 10*3/uL (ref 0.7–4.0)
MCHC: 33.4 g/dL (ref 30.0–36.0)
MCV: 90 fl (ref 78.0–100.0)
Monocytes Absolute: 0.5 10*3/uL (ref 0.1–1.0)
Monocytes Relative: 6 % (ref 3.0–12.0)
NEUTROS ABS: 4.7 10*3/uL (ref 1.4–7.7)
Neutrophils Relative %: 59.8 % (ref 43.0–77.0)
Platelets: 287 10*3/uL (ref 150.0–400.0)
RBC: 4.78 Mil/uL (ref 4.22–5.81)
RDW: 13.8 % (ref 11.5–15.5)
WBC: 7.9 10*3/uL (ref 4.0–10.5)

## 2014-06-19 LAB — LIPID PANEL
CHOLESTEROL: 149 mg/dL (ref 0–200)
HDL: 34.1 mg/dL — ABNORMAL LOW (ref >39.00)
LDL Cholesterol: 80 mg/dL (ref 0–99)
NonHDL: 114.9
Total CHOL/HDL Ratio: 4
Triglycerides: 177 mg/dL — ABNORMAL HIGH (ref 0.0–149.0)
VLDL: 35.4 mg/dL (ref 0.0–40.0)

## 2014-06-19 LAB — BASIC METABOLIC PANEL
BUN: 21 mg/dL (ref 6–23)
CO2: 29 mEq/L (ref 19–32)
Calcium: 9.6 mg/dL (ref 8.4–10.5)
Chloride: 105 mEq/L (ref 96–112)
Creatinine, Ser: 1 mg/dL (ref 0.4–1.5)
GFR: 80.63 mL/min (ref >60.00)
Glucose, Bld: 116 mg/dL — ABNORMAL HIGH (ref 70–99)
Potassium: 4.4 mEq/L (ref 3.5–5.1)
SODIUM: 139 meq/L (ref 135–145)

## 2014-06-19 LAB — HEPATIC FUNCTION PANEL
ALT: 25 U/L (ref 0–53)
AST: 24 U/L (ref 0–37)
Albumin: 3.9 g/dL (ref 3.5–5.2)
Alkaline Phosphatase: 81 U/L (ref 39–117)
BILIRUBIN TOTAL: 1 mg/dL (ref 0.2–1.2)
Bilirubin, Direct: 0.1 mg/dL (ref 0.0–0.3)
Total Protein: 7.4 g/dL (ref 6.0–8.3)

## 2014-06-19 LAB — HEMOGLOBIN A1C: Hgb A1c MFr Bld: 6.6 % — ABNORMAL HIGH (ref 4.6–6.5)

## 2014-06-20 LAB — PSA: PSA: 1.34 ng/mL (ref 0.10–4.00)

## 2014-06-20 LAB — FERRITIN: Ferritin: 153.4 ng/mL (ref 22.0–322.0)

## 2014-06-22 ENCOUNTER — Other Ambulatory Visit: Payer: BC Managed Care – PPO

## 2014-06-26 ENCOUNTER — Ambulatory Visit: Payer: BC Managed Care – PPO | Admitting: Internal Medicine

## 2014-06-29 ENCOUNTER — Encounter: Payer: Self-pay | Admitting: Internal Medicine

## 2014-06-29 ENCOUNTER — Ambulatory Visit (INDEPENDENT_AMBULATORY_CARE_PROVIDER_SITE_OTHER): Payer: BC Managed Care – PPO | Admitting: Internal Medicine

## 2014-06-29 VITALS — BP 120/70 | HR 84 | Temp 98.5°F | Ht 69.5 in | Wt 233.2 lb

## 2014-06-29 DIAGNOSIS — E7800 Pure hypercholesterolemia, unspecified: Secondary | ICD-10-CM

## 2014-06-29 DIAGNOSIS — I10 Essential (primary) hypertension: Secondary | ICD-10-CM

## 2014-06-29 DIAGNOSIS — E119 Type 2 diabetes mellitus without complications: Secondary | ICD-10-CM

## 2014-06-29 DIAGNOSIS — D649 Anemia, unspecified: Secondary | ICD-10-CM

## 2014-06-29 DIAGNOSIS — N400 Enlarged prostate without lower urinary tract symptoms: Secondary | ICD-10-CM

## 2014-06-29 NOTE — Progress Notes (Signed)
Pre visit review using our clinic review tool, if applicable. No additional management support is needed unless otherwise documented below in the visit note. 

## 2014-07-02 ENCOUNTER — Encounter: Payer: Self-pay | Admitting: Internal Medicine

## 2014-07-02 NOTE — Assessment & Plan Note (Signed)
Blood pressure has been under good control.  Same meds.  Follow.  Follow metabolic panel.   

## 2014-07-02 NOTE — Assessment & Plan Note (Signed)
Was evaluated by Dr Lonna CobbStoioff.  Is s/p biopsy.  States everything checked out fine.  Follow up PSA 10/14/13 - 1.56.  Recheck 06/19/14 - 1.36.  Follow

## 2014-07-02 NOTE — Assessment & Plan Note (Signed)
Low cholesterol diet and exercise.  Continue lipitor.  Follow lipid profile and liver function.  LDL (06/19/14) 80.

## 2014-07-02 NOTE — Progress Notes (Signed)
  Subjective:    Patient ID: Aaron Allison, male    DOB: 04/05/1966, 48 y.o.   MRN: [id number]  HPI 48 year old male with past history of hypertension, diabetes, hypercholesterolemia and iron deficient anemia who comes in today for a scheduled follow up.   He states he has been doing well.   Did see Dr Lonna CobbStoioff.  Had prostate biopsy.  Negative.  He was following his prostate and PSA.   No longer being followed there.  PSA wnl on recent check.  No chest pain or tightness.  Reflux controlled.  Has adjusted his diet.  Watching what he eats.  Has lost weight.  Overall he feels he is doing well.       Past Medical History  Diagnosis Date  . Hypertension   . Hypercholesterolemia   . Iron deficiency anemia   . Hyperglycemia   . GERD (gastroesophageal reflux disease)     Outpatient Encounter Prescriptions as of 06/29/2014  Medication Sig  . aspirin 81 MG tablet Take 81 mg by mouth daily.  Marland Kitchen. atorvastatin (LIPITOR) 40 MG tablet Take 1 tablet (40 mg total) by mouth daily.  . ferrous sulfate 325 (65 FE) MG tablet Take 325 mg by mouth daily.  . Lancets (ACCU-CHEK SOFT TOUCH) lancets Use as instructed  . metoprolol succinate (TOPROL-XL) 25 MG 24 hr tablet Take 1 tablet (25 mg total) by mouth daily.  Marland Kitchen. olmesartan-hydrochlorothiazide (BENICAR HCT) 40-12.5 MG per tablet Take 1 tablet by mouth daily.  Marland Kitchen. omeprazole (PRILOSEC) 20 MG capsule Take 1 capsule (20 mg total) by mouth daily.     Review of Systems Patient denies any headache, lightheadedness or dizziness.  No sinus or allergy symptoms.   No chest pain, tightness or palpitations.  No increased shortness of breath, cough or congestion.  No nausea or vomiting.  Acid reflux controlled.  No abdominal pain or cramping.  No bowel change, such as diarrhea, constipation, BRBPR or melana.  No urine change.  Am sugars averaging 110-120.  Has adjusted his diet.  Feels better.        Objective:   Physical Exam  Filed Vitals:   06/29/14 1443  BP: 120/70   Pulse: 84  Temp: 98.5 F (36.9 C)   Blood pressure recheck:  58120/6478  48 year old male in no acute distress.  HEENT:  Nares - clear.  Oropharynx - without lesions. NECK:  Supple.  Nontender.  No audible carotid bruit.  HEART:  Appears to be regular.   LUNGS:  No crackles or wheezing audible.  Respirations even and unlabored.   RADIAL PULSE:  Equal bilaterally.  ABDOMEN:  Soft.  Nontender.  Bowel sounds present and normal.  No audible abdominal bruit.   EXTREMITIES:  No increased edema present.  DP pulses palpable and equal bilaterally.         Assessment & Plan:  ELEVATED PSA.  Biopsy negative.  Was seeing Dr Lonna CobbStoioff.  Follow PSA.    CARDIOVASCULAR.  Asymptomatic.    HEALTH MAINTENANCE.  Physical 10/14/13.   Prostate and PSAs through Dr Lonna CobbStoioff previously.  Follow PSA.  Colonoscopy 10/30/05 normal.

## 2014-07-02 NOTE — Assessment & Plan Note (Signed)
Hgb on last check wnl.  EGD 10/30/05 normal.  Colonoscopy 10/30/05 normal.  Follow.  Hgb and ferritin just checked 06/19/14 - wnl.  Decrease iron to q mon, wed and Friday.  Follow.

## 2014-07-02 NOTE — Assessment & Plan Note (Signed)
Have discussed diet, exercise and weight loss.  Not on medication.  He has adjusted his diet.  Has lost weight.  Sugars as outlined.  A1c just checked 6.6.   Follow.

## 2014-12-25 ENCOUNTER — Other Ambulatory Visit: Payer: Self-pay | Admitting: *Deleted

## 2014-12-25 ENCOUNTER — Telehealth: Payer: Self-pay | Admitting: Internal Medicine

## 2014-12-25 MED ORDER — ATORVASTATIN CALCIUM 40 MG PO TABS: 40.0000 mg | ORAL_TABLET | Freq: Every day | ORAL | Status: DC

## 2014-12-25 NOTE — Telephone Encounter (Signed)
atorvastatin (LIPITOR) 40 MG tablet  ?

## 2014-12-25 NOTE — Telephone Encounter (Signed)
Rx sent to [facility]. Pt has appt on 12/29/14

## 2014-12-27 ENCOUNTER — Other Ambulatory Visit (INDEPENDENT_AMBULATORY_CARE_PROVIDER_SITE_OTHER): Payer: BLUE CROSS/BLUE SHIELD

## 2014-12-27 DIAGNOSIS — E119 Type 2 diabetes mellitus without complications: Secondary | ICD-10-CM

## 2014-12-27 DIAGNOSIS — D649 Anemia, unspecified: Secondary | ICD-10-CM | POA: Diagnosis not present

## 2014-12-27 DIAGNOSIS — E7800 Pure hypercholesterolemia, unspecified: Secondary | ICD-10-CM

## 2014-12-27 LAB — CBC WITH DIFFERENTIAL/PLATELET
BASOS PCT: 0.3 % (ref 0.0–3.0)
Basophils Absolute: 0 10*3/uL (ref 0.0–0.1)
EOS PCT: 2.2 % (ref 0.0–5.0)
Eosinophils Absolute: 0.2 10*3/uL (ref 0.0–0.7)
HEMATOCRIT: 42.7 % (ref 39.0–52.0)
Hemoglobin: 14.8 g/dL (ref 13.0–17.0)
LYMPHS PCT: 29.6 % (ref 12.0–46.0)
Lymphs Abs: 2.6 10*3/uL (ref 0.7–4.0)
MCHC: 34.5 g/dL (ref 30.0–36.0)
MCV: 87.2 fl (ref 78.0–100.0)
Monocytes Absolute: 0.7 10*3/uL (ref 0.1–1.0)
Monocytes Relative: 8.1 % (ref 3.0–12.0)
NEUTROS ABS: 5.2 10*3/uL (ref 1.4–7.7)
Neutrophils Relative %: 59.8 % (ref 43.0–77.0)
Platelets: 329 10*3/uL (ref 150.0–400.0)
RBC: 4.9 Mil/uL (ref 4.22–5.81)
RDW: 13.4 % (ref 11.5–15.5)
WBC: 8.8 10*3/uL (ref 4.0–10.5)

## 2014-12-27 LAB — LIPID PANEL
Cholesterol: 151 mg/dL (ref 0–200)
HDL: 38.7 mg/dL — AB (ref >39.00)
LDL Cholesterol: 88 mg/dL (ref 0–99)
NONHDL: 112.3
Total CHOL/HDL Ratio: 4
Triglycerides: 120 mg/dL (ref 0.0–149.0)
VLDL: 24 mg/dL (ref 0.0–40.0)

## 2014-12-27 LAB — HEMOGLOBIN A1C: HEMOGLOBIN A1C: 6.9 % — AB (ref 4.6–6.5)

## 2014-12-27 LAB — HEPATIC FUNCTION PANEL
ALBUMIN: 3.9 g/dL (ref 3.5–5.2)
ALK PHOS: 73 U/L (ref 39–117)
ALT: 28 U/L (ref 0–53)
AST: 23 U/L (ref 0–37)
BILIRUBIN DIRECT: 0.1 mg/dL (ref 0.0–0.3)
Total Bilirubin: 0.6 mg/dL (ref 0.2–1.2)
Total Protein: 7.3 g/dL (ref 6.0–8.3)

## 2014-12-27 LAB — BASIC METABOLIC PANEL
BUN: 20 mg/dL (ref 6–23)
CHLORIDE: 101 meq/L (ref 96–112)
CO2: 31 mEq/L (ref 19–32)
Calcium: 9.2 mg/dL (ref 8.4–10.5)
Creatinine, Ser: 1.06 mg/dL (ref 0.40–1.50)
GFR: 78.71 mL/min (ref >60.00)
GLUCOSE: 128 mg/dL — AB (ref 70–99)
POTASSIUM: 4.1 meq/L (ref 3.5–5.1)
Sodium: 136 mEq/L (ref 135–145)

## 2014-12-27 LAB — FERRITIN: Ferritin: 153.5 ng/mL (ref 22.0–322.0)

## 2014-12-29 ENCOUNTER — Encounter: Payer: Self-pay | Admitting: Internal Medicine

## 2014-12-29 ENCOUNTER — Ambulatory Visit (INDEPENDENT_AMBULATORY_CARE_PROVIDER_SITE_OTHER): Payer: BLUE CROSS/BLUE SHIELD | Admitting: Internal Medicine

## 2014-12-29 VITALS — BP 126/77 | HR 91 | Temp 98.3°F | Ht 69.5 in | Wt 245.1 lb

## 2014-12-29 DIAGNOSIS — D649 Anemia, unspecified: Secondary | ICD-10-CM | POA: Diagnosis not present

## 2014-12-29 DIAGNOSIS — Z0000 Encounter for general adult medical examination without abnormal findings: Secondary | ICD-10-CM

## 2014-12-29 DIAGNOSIS — E119 Type 2 diabetes mellitus without complications: Secondary | ICD-10-CM

## 2014-12-29 DIAGNOSIS — E7800 Pure hypercholesterolemia, unspecified: Secondary | ICD-10-CM

## 2014-12-29 DIAGNOSIS — I10 Essential (primary) hypertension: Secondary | ICD-10-CM

## 2014-12-29 DIAGNOSIS — K219 Gastro-esophageal reflux disease without esophagitis: Secondary | ICD-10-CM

## 2014-12-29 MED ORDER — OLMESARTAN MEDOXOMIL-HCTZ 40-12.5 MG PO TABS: 1.0000 | ORAL_TABLET | Freq: Every day | ORAL | Status: DC

## 2014-12-29 MED ORDER — OMEPRAZOLE 20 MG PO CPDR: 20.0000 mg | DELAYED_RELEASE_CAPSULE | Freq: Every day | ORAL | Status: DC

## 2014-12-29 MED ORDER — METOPROLOL SUCCINATE ER 25 MG PO TB24: 25.0000 mg | ORAL_TABLET | Freq: Every day | ORAL | Status: DC

## 2014-12-29 NOTE — Patient Instructions (Signed)
Increase prilosec to twice a day.  Take one 30 minutes before breakfast and one 30 minutes before supper.

## 2014-12-29 NOTE — Progress Notes (Signed)
Patient ID: Aaron Deer., male   DOB: 1966/02/11, 49 y.o.   MRN: [id number]   Subjective:    Patient ID: Aaron Deer., male    DOB: 1965/10/09, 49 y.o.   MRN: [id number]  HPI  Patient here for a physical exam.  States he is doing well.  Working a lot of hours.  Does report increased indigestion/acid reflux.  Taking prilosec.  Had been well controlled.  Just recently flared.  Has gained weight.  Eating more snacks.  Plans to adjust his diet.  Stays active.   Denies any cardiac symptoms with increased activity or exertion.  Breathing stable.  Bowels stable.     Past Medical History  Diagnosis Date  . Hypertension   . Hypercholesterolemia   . Iron deficiency anemia   . Hyperglycemia   . GERD (gastroesophageal reflux disease)     Current Outpatient Prescriptions on File Prior to Visit  Medication Sig Dispense Refill  . aspirin 81 MG tablet Take 81 mg by mouth daily.    Marland Kitchen atorvastatin (LIPITOR) 40 MG tablet Take 1 tablet (40 mg total) by mouth daily. 90 tablet 1  . ferrous sulfate 325 (65 FE) MG tablet Take 325 mg by mouth daily.    . Lancets (ACCU-CHEK SOFT TOUCH) lancets Use as instructed 100 each 5   No current facility-administered medications on file prior to visit.    Review of Systems  Constitutional: Negative for appetite change and unexpected weight change (has been eating snacks, etc.  ).  HENT: Negative for congestion and sinus pressure.   Eyes: Negative for pain and visual disturbance.  Respiratory: Negative for cough, chest tightness and shortness of breath.   Cardiovascular: Negative for chest pain, palpitations and leg swelling.  Gastrointestinal: Negative for nausea, vomiting, abdominal pain and diarrhea.       Does report increased indigestion.   Genitourinary: Negative for dysuria and difficulty urinating.  Musculoskeletal: Negative for back pain and joint swelling.  Skin: Negative for color change and rash.  Neurological: Negative for dizziness,  light-headedness and headaches.  Hematological: Negative for adenopathy. Does not bruise/bleed easily.  Psychiatric/Behavioral: Negative for dysphoric mood and agitation.       Objective:     Blood pressure recheck:  128/82  Physical Exam  Constitutional: He is oriented to person, place, and time. He appears well-developed and well-nourished. No distress.  HENT:  Head: Normocephalic and atraumatic.  Nose: Nose normal.  Mouth/Throat: Oropharynx is clear and moist. No oropharyngeal exudate.  Eyes: Conjunctivae are normal. Right eye exhibits no discharge. Left eye exhibits no discharge.  Neck: Neck supple. No thyromegaly present.  Cardiovascular: Normal rate and regular rhythm.   Pulmonary/Chest: Breath sounds normal. No respiratory distress. He has no wheezes.  Abdominal: Soft. Bowel sounds are normal. There is no tenderness.  Genitourinary: Rectum normal.  Normal descending testicles.  No nodule appreciated.  Rectal exam - heme negative.    Musculoskeletal: He exhibits no edema or tenderness.  Lymphadenopathy:    He has no cervical adenopathy.  Neurological: He is alert and oriented to person, place, and time.  Skin: Skin is warm and dry. No rash noted.  Psychiatric: He has a normal mood and affect. His behavior is normal.    BP 126/77 mmHg  Pulse 91  Temp(Src) 98.3 F (36.8 C) (Oral)  Ht 5' 9.5" (1.765 m)  Wt 245 lb 2 oz (111.188 kg)  BMI 35.69 kg/m2  SpO2 97% Wt Readings from Last 3 Encounters:  12/29/14 245 lb 2 oz (111.188 kg)  06/29/14 233 lb 4 oz (105.802 kg)  02/21/14 237 lb 4 oz (107.616 kg)     Lab Results  Component Value Date   WBC 8.8 12/27/2014   HGB 14.8 12/27/2014   HCT 42.7 12/27/2014   PLT 329.0 12/27/2014   GLUCOSE 128* 12/27/2014   CHOL 151 12/27/2014   TRIG 120.0 12/27/2014   HDL 38.70* 12/27/2014   LDLCALC 88 12/27/2014   ALT 28 12/27/2014   AST 23 12/27/2014   NA 136 12/27/2014   K 4.1 12/27/2014   CL 101 12/27/2014   CREATININE 1.06  12/27/2014   BUN 20 12/27/2014   CO2 31 12/27/2014   TSH 2.96 02/13/2014   PSA 1.34 06/19/2014   HGBA1C 6.9* 12/27/2014   MICROALBUR 3.2* 02/13/2014       Assessment & Plan:   Problem List Items Addressed This Visit    Anemia    hgb just checked - 14.8.        Diabetes mellitus    Low carb diet and exercise.  Discussed diet adjustment.  A1c just checked 6.9.  Follow metabolic panel and a1c.       Relevant Medications   olmesartan-hydrochlorothiazide (BENICAR HCT) 40-12.5 MG per tablet   GERD (gastroesophageal reflux disease)    Aced reflux as outlined.  On pirlosec.  Increase to bid.  Get him back in soon to reassess.  Follow.  May need GI evaluation if persistent.        Relevant Medications   omeprazole (PRILOSEC) capsule   Health care maintenance    Physical today.  Colonoscopy 2007.  06/19/14 PSA 1.34.        Hypercholesterolemia    Low cholesterol diet and exercise.  On atorvastatin.  Follow lipid panel and liver function tests.        Relevant Medications   metoprolol succinate (TOPROL-XL) 24 hr tablet   olmesartan-hydrochlorothiazide (BENICAR HCT) 40-12.5 MG per tablet   Hypertension - Primary    Blood pressure under good control.  Same medication regimen.  Follow pressures.  Follow metabolic panel.       Relevant Medications   metoprolol succinate (TOPROL-XL) 24 hr tablet   olmesartan-hydrochlorothiazide (BENICAR HCT) 40-12.5 MG per tablet     I spent 25 minutes with the patient and more than 50% of the time was spent in consultation regarding the above.     Dale DurhamSCOTT, Maisen Schmit, MD

## 2014-12-29 NOTE — Progress Notes (Signed)
Pre visit review using our clinic review tool, if applicable. No additional management support is needed unless otherwise documented below in the visit note. 

## 2015-01-01 ENCOUNTER — Encounter: Payer: Self-pay | Admitting: Internal Medicine

## 2015-01-01 DIAGNOSIS — K219 Gastro-esophageal reflux disease without esophagitis: Secondary | ICD-10-CM | POA: Insufficient documentation

## 2015-01-01 DIAGNOSIS — Z0000 Encounter for general adult medical examination without abnormal findings: Secondary | ICD-10-CM | POA: Insufficient documentation

## 2015-01-01 MED ORDER — OMEPRAZOLE 20 MG PO CPDR: 20.0000 mg | DELAYED_RELEASE_CAPSULE | Freq: Two times a day (BID) | ORAL | Status: DC

## 2015-01-01 NOTE — Assessment & Plan Note (Signed)
Aced reflux as outlined.  On pirlosec.  Increase to bid.  Get him back in soon to reassess.  Follow.  May need GI evaluation if persistent.

## 2015-01-01 NOTE — Assessment & Plan Note (Signed)
Physical today.  Colonoscopy 2007.  06/19/14 PSA 1.34.

## 2015-01-01 NOTE — Assessment & Plan Note (Signed)
Low carb diet and exercise.  Discussed diet adjustment.  A1c just checked 6.9.  Follow metabolic panel and a1c.

## 2015-01-01 NOTE — Assessment & Plan Note (Signed)
Blood pressure under good control.  Same medication regimen.  Follow pressures.  Follow metabolic panel.  

## 2015-01-01 NOTE — Assessment & Plan Note (Signed)
hgb just checked - 14.8.

## 2015-01-01 NOTE — Assessment & Plan Note (Signed)
Low cholesterol diet and exercise.  On atorvastatin.  Follow lipid panel and liver function tests.   

## 2015-01-21 NOTE — Op Note (Signed)
PATIENT NAME:  Aaron Allison, Aaron Allison MR#:  [id number] DATE OF BIRTH:  [birth year]  DATE OF PROCEDURE:  10/16/2011  PREOPERATIVE DIAGNOSES: Right wrist volar ganglion cyst.   POSTOPERATIVE DIAGNOSIS: Right wrist volar ganglion cyst.   PROCEDURE: Ganglion cyst excision.   SURGEON: Leitha SchullerMichael J. Jovi Zavadil, MD    ANESTHESIA: General.   DESCRIPTION OF PROCEDURE: The patient was brought to the operating room and after adequate anesthesia was obtained the right arm was prepped and draped in the usual sterile fashion with a tourniquet applied to the upper arm. After patient identification and time-out procedures were completed, the tourniquet was raised to 250 mmHg. Curved incision was made over the mass. It measured approximately 2.5 cm in diameter. After incising the skin and hemostasis being achieved with electrocautery, the cyst was exposed circumferentially and fluid was evacuated from the cyst with the root of the cyst being identified. Most of the cyst was excised at this point and sent as a specimen. At the root of the cyst, it appeared to be arising from the radial scaphoid joint. A freer elevator was used to abrade the capsule and cautery was used to amputate the stump to try to prevent recurrence. After this was completed, the wound was irrigated and the tourniquet let down to make sure the radial artery had not been injured. There was minimal bleeding. Radial artery was intact. The wound was then closed with simple interrupted 4-0 nylon skin suture. Xeroform, 4 x 4's, Webril, and volar splint were applied along with an Ace wrap.   TOURNIQUET TIME: 8 minutes.   COMPLICATIONS: None.   ESTIMATED BLOOD LOSS: Minimal.      SPECIMEN: Excised cyst, right wrist.   ____________________________ Leitha SchullerMichael J. Brandalyn Harting, MD mjm:drc D: 10/16/2011 16:18:19 ET T: 10/16/2011 16:33:42 ET JOB#: [id number]  cc: Leitha SchullerMichael J. Moxie Kalil, MD, <Dictator> Leitha SchullerMICHAEL J Ambar Raphael MD ELECTRONICALLY SIGNED 10/17/2011 10:36

## 2015-03-12 ENCOUNTER — Ambulatory Visit
Admission: RE | Admit: 2015-03-12 | Discharge: 2015-03-12 | Disposition: A | Discharge status: Home / Self Care | Payer: BLUE CROSS/BLUE SHIELD | Source: Ambulatory Visit | Attending: Internal Medicine | Admitting: Internal Medicine

## 2015-03-12 ENCOUNTER — Ambulatory Visit (INDEPENDENT_AMBULATORY_CARE_PROVIDER_SITE_OTHER): Payer: BLUE CROSS/BLUE SHIELD | Admitting: Internal Medicine

## 2015-03-12 ENCOUNTER — Encounter: Payer: Self-pay | Admitting: Internal Medicine

## 2015-03-12 VITALS — BP 128/72 | HR 82 | Temp 98.7°F | Resp 18 | Ht 69.5 in | Wt 244.4 lb

## 2015-03-12 DIAGNOSIS — K219 Gastro-esophageal reflux disease without esophagitis: Secondary | ICD-10-CM

## 2015-03-12 DIAGNOSIS — M25572 Pain in left ankle and joints of left foot: Secondary | ICD-10-CM

## 2015-03-12 DIAGNOSIS — I10 Essential (primary) hypertension: Secondary | ICD-10-CM

## 2015-03-12 DIAGNOSIS — N400 Enlarged prostate without lower urinary tract symptoms: Secondary | ICD-10-CM

## 2015-03-12 DIAGNOSIS — E7800 Pure hypercholesterolemia, unspecified: Secondary | ICD-10-CM

## 2015-03-12 DIAGNOSIS — E119 Type 2 diabetes mellitus without complications: Secondary | ICD-10-CM | POA: Diagnosis not present

## 2015-03-12 DIAGNOSIS — D649 Anemia, unspecified: Secondary | ICD-10-CM

## 2015-03-12 DIAGNOSIS — Z0000 Encounter for general adult medical examination without abnormal findings: Secondary | ICD-10-CM

## 2015-03-12 MED ORDER — OMEPRAZOLE 40 MG PO CPDR: 40.0000 mg | DELAYED_RELEASE_CAPSULE | Freq: Every day | ORAL | Status: DC

## 2015-03-12 NOTE — Progress Notes (Signed)
Pre visit review using our clinic review tool, if applicable. No additional management support is needed unless otherwise documented below in the visit note. 

## 2015-03-12 NOTE — Progress Notes (Signed)
Patient ID: [name]., male   DOB: 1966/01/23, 49 y.o.   MRN: [id number]   Subjective:    Patient ID: [name]., male    DOB: May 23, 1966, 49 y.o.   MRN: [id number]  HPI  Patient here for a scheduled follow up.  States he was playing golf last week.  Stepped in a hoe.  Did not hurt initially.  States noticed over the last few days, increased pain and swelling.  He has been icing his foot and elevating.  Took tylenol.  Helped.  Still with discomfort and swelling. Trying to stay active.  We discussed diet and exercise.  No cardiac symptoms with increased activity or exertion.  Some occasional acid reflux.  Discussed diet modification related to this.  Bowels stable.    Past Medical History  Diagnosis Date  . Hypertension   . Hypercholesterolemia   . Iron deficiency anemia   . Hyperglycemia   . GERD (gastroesophageal reflux disease)     Current Outpatient Prescriptions on File Prior to Visit  Medication Sig Dispense Refill  . aspirin 81 MG tablet Take 81 mg by mouth daily.    Marland Kitchen atorvastatin (LIPITOR) 40 MG tablet Take 1 tablet (40 mg total) by mouth daily. 90 tablet 1  . ferrous sulfate 325 (65 FE) MG tablet Take 325 mg by mouth daily.    . Lancets (ACCU-CHEK SOFT TOUCH) lancets Use as instructed 100 each 5  . metoprolol succinate (TOPROL-XL) 25 MG 24 hr tablet Take 1 tablet (25 mg total) by mouth daily. 90 tablet 3  . olmesartan-hydrochlorothiazide (BENICAR HCT) 40-12.5 MG per tablet Take 1 tablet by mouth daily. 90 tablet 3   No current facility-administered medications on file prior to visit.    Review of Systems  Constitutional: Negative for appetite change and unexpected weight change.  HENT: Negative for congestion and sinus pressure.   Respiratory: Negative for cough, chest tightness and shortness of breath.   Cardiovascular: Negative for chest pain, palpitations and leg swelling.  Gastrointestinal: Negative for nausea, vomiting, abdominal pain and diarrhea.    Musculoskeletal:       Left ankle pain and swelling.   Skin: Negative for color change and rash.  Neurological: Negative for dizziness, light-headedness and headaches.  Psychiatric/Behavioral: Negative for dysphoric mood and agitation.       Objective:    Physical Exam  Constitutional: He appears well-developed and well-nourished. No distress.  HENT:  Nose: Nose normal.  Mouth/Throat: Oropharynx is clear and moist.  Neck: Neck supple. No thyromegaly present.  Cardiovascular: Normal rate and regular rhythm.   Pulmonary/Chest: Effort normal and breath sounds normal. No respiratory distress.  Abdominal: Soft. Bowel sounds are normal. There is no tenderness.  Musculoskeletal: He exhibits no edema.  Increased soft tissue swelling - left foot and ankle.  Increased tenderness to palpation over the ankle and with flexion of his foot.    Lymphadenopathy:    He has no cervical adenopathy.  Skin: No rash noted. No erythema.  Psychiatric: He has a normal mood and affect. His behavior is normal.    BP 128/72 mmHg  Pulse 82  Temp(Src) 98.7 F (37.1 C) (Oral)  Resp 18  Ht 5' 9.5" (1.765 m)  Wt 244 lb 6 oz (110.848 kg)  BMI 35.58 kg/m2  SpO2 97% Wt Readings from Last 3 Encounters:  03/12/15 244 lb 6 oz (110.848 kg)  12/29/14 245 lb 2 oz (111.188 kg)  06/29/14 233 lb 4 oz (105.802 kg)  Lab Results  Component Value Date   WBC 8.8 12/27/2014   HGB 14.8 12/27/2014   HCT 42.7 12/27/2014   PLT 329.0 12/27/2014   GLUCOSE 128* 12/27/2014   CHOL 151 12/27/2014   TRIG 120.0 12/27/2014   HDL 38.70* 12/27/2014   LDLCALC 88 12/27/2014   ALT 28 12/27/2014   AST 23 12/27/2014   NA 136 12/27/2014   K 4.1 12/27/2014   CL 101 12/27/2014   CREATININE 1.06 12/27/2014   BUN 20 12/27/2014   CO2 31 12/27/2014   TSH 2.96 02/13/2014   PSA 1.34 06/19/2014   HGBA1C 6.9* 12/27/2014   MICROALBUR 3.2* 02/13/2014       Assessment & Plan:   Problem List Items Addressed This Visit     Anemia    Follow cbc.       Diabetes mellitus    Low carb diet and exercise.  Follow met b and a1c.       Relevant Orders   Hemoglobin A1c   Enlarged prostate    Evaluated by Dr Bernardo Heater.  Is s/p biopsy.  [place] 06/19/14 - 1.36.  Follow.        GERD (gastroesophageal reflux disease)    Symptoms as outlined.  Increase omeprazole to 92m q day.  Follow.        Relevant Medications   omeprazole (PRILOSEC) 40 MG capsule   Health care maintenance    Physical 12/29/14.  Colonoscopy 2007.  PSA 1.34 - 06/19/14.        Hypercholesterolemia    On lipitor.  Follow lipid panel and liver function tests.        Relevant Orders   Lipid panel   Hepatic function panel   Hypertension    Blood pressure doing well.  Same medication regimen.  Follow pressures.  Follow metabolic panel.       Relevant Orders   Basic metabolic panel   Left ankle pain - Primary    Left ankle pain and swelling.  Exam as outlined.  Check xray.  Ice.  Elevate.  Follow.        Relevant Orders   DG Ankle Complete Left (Completed)     I spent 25 minutes with the patient and more than 50% of the time was spent in consultation regarding the above.     SEinar Pheasant MD

## 2015-03-15 ENCOUNTER — Telehealth: Payer: Self-pay

## 2015-03-15 NOTE — Telephone Encounter (Signed)
Noted, Thanks

## 2015-03-15 NOTE — Telephone Encounter (Signed)
The wife called back and the result note was read to her word by word.  She stated she understood and she would speak with the patient about the podiatry referral.  She stated she would call the office back if they wanted to move forward with the referral.  Thanks!

## 2015-03-19 ENCOUNTER — Encounter: Payer: Self-pay | Admitting: Internal Medicine

## 2015-03-19 NOTE — Assessment & Plan Note (Signed)
On lipitor.  Follow lipid panel and liver function tests.   

## 2015-03-19 NOTE — Assessment & Plan Note (Signed)
Low carb diet and exercise.  Follow met b and a1c.  

## 2015-03-19 NOTE — Assessment & Plan Note (Signed)
Symptoms as outlined.  Increase omeprazole to 40mg  q day.  Follow.

## 2015-03-19 NOTE — Assessment & Plan Note (Signed)
Left ankle pain and swelling.  Exam as outlined.  Check xray.  Ice.  Elevate.  Follow.

## 2015-03-19 NOTE — Assessment & Plan Note (Signed)
Blood pressure doing well.  Same medication regimen.  Follow pressures.  Follow metabolic panel.   

## 2015-03-19 NOTE — Assessment & Plan Note (Signed)
Follow cbc.  

## 2015-03-19 NOTE — Assessment & Plan Note (Signed)
Physical 12/29/14.  Colonoscopy 2007.  PSA 1.34 - 06/19/14.

## 2015-03-19 NOTE — Assessment & Plan Note (Signed)
Evaluated by Dr Lonna Cobb.  Is s/p biopsy.  Ok.  Recheck 06/19/14 - 1.36.  Follow.

## 2015-05-14 ENCOUNTER — Other Ambulatory Visit: Payer: BLUE CROSS/BLUE SHIELD

## 2015-05-15 ENCOUNTER — Ambulatory Visit: Payer: BLUE CROSS/BLUE SHIELD | Admitting: Internal Medicine

## 2015-06-18 ENCOUNTER — Other Ambulatory Visit (INDEPENDENT_AMBULATORY_CARE_PROVIDER_SITE_OTHER): Payer: BLUE CROSS/BLUE SHIELD

## 2015-06-18 DIAGNOSIS — E7800 Pure hypercholesterolemia, unspecified: Secondary | ICD-10-CM

## 2015-06-18 DIAGNOSIS — E119 Type 2 diabetes mellitus without complications: Secondary | ICD-10-CM

## 2015-06-18 DIAGNOSIS — I10 Essential (primary) hypertension: Secondary | ICD-10-CM | POA: Diagnosis not present

## 2015-06-18 LAB — HEPATIC FUNCTION PANEL
ALT: 21 U/L (ref 0–53)
AST: 16 U/L (ref 0–37)
Albumin: 4 g/dL (ref 3.5–5.2)
Alkaline Phosphatase: 72 U/L (ref 39–117)
BILIRUBIN DIRECT: 0.1 mg/dL (ref 0.0–0.3)
BILIRUBIN TOTAL: 0.6 mg/dL (ref 0.2–1.2)
Total Protein: 7.2 g/dL (ref 6.0–8.3)

## 2015-06-18 LAB — LIPID PANEL
CHOL/HDL RATIO: 4
CHOLESTEROL: 139 mg/dL (ref 0–200)
HDL: 36.5 mg/dL — ABNORMAL LOW (ref >39.00)
LDL CALC: 73 mg/dL (ref 0–99)
NONHDL: 102.61
Triglycerides: 148 mg/dL (ref 0.0–149.0)
VLDL: 29.6 mg/dL (ref 0.0–40.0)

## 2015-06-18 LAB — BASIC METABOLIC PANEL
BUN: 15 mg/dL (ref 6–23)
CHLORIDE: 102 meq/L (ref 96–112)
CO2: 32 meq/L (ref 19–32)
Calcium: 9.3 mg/dL (ref 8.4–10.5)
Creatinine, Ser: 0.95 mg/dL (ref 0.40–1.50)
GFR: 89.15 mL/min (ref >60.00)
GLUCOSE: 121 mg/dL — AB (ref 70–99)
POTASSIUM: 4.1 meq/L (ref 3.5–5.1)
SODIUM: 139 meq/L (ref 135–145)

## 2015-06-19 ENCOUNTER — Ambulatory Visit (INDEPENDENT_AMBULATORY_CARE_PROVIDER_SITE_OTHER): Payer: BLUE CROSS/BLUE SHIELD | Admitting: Internal Medicine

## 2015-06-19 ENCOUNTER — Encounter: Payer: Self-pay | Admitting: Internal Medicine

## 2015-06-19 VITALS — BP 122/80 | HR 86 | Temp 98.7°F | Ht 69.5 in | Wt 241.1 lb

## 2015-06-19 DIAGNOSIS — K219 Gastro-esophageal reflux disease without esophagitis: Secondary | ICD-10-CM | POA: Diagnosis not present

## 2015-06-19 DIAGNOSIS — N400 Enlarged prostate without lower urinary tract symptoms: Secondary | ICD-10-CM

## 2015-06-19 DIAGNOSIS — M25572 Pain in left ankle and joints of left foot: Secondary | ICD-10-CM

## 2015-06-19 DIAGNOSIS — E119 Type 2 diabetes mellitus without complications: Secondary | ICD-10-CM

## 2015-06-19 DIAGNOSIS — E7800 Pure hypercholesterolemia, unspecified: Secondary | ICD-10-CM

## 2015-06-19 DIAGNOSIS — I10 Essential (primary) hypertension: Secondary | ICD-10-CM

## 2015-06-19 NOTE — Progress Notes (Signed)
Pre-visit discussion using our clinic review tool. No additional management support is needed unless otherwise documented below in the visit note.  

## 2015-06-19 NOTE — Progress Notes (Signed)
Patient ID: Aaron Allison., male   DOB: 1965-11-19, 49 y.o.   MRN: [id number]   Subjective:    Patient ID: Aaron Allison., male    DOB: [birth year], 49 y.o.   MRN: [id number]  HPI  Patient with history of hypertension, diabetes, hypercholesterolemia and reflux.  He comes in today to follow up on these issues.  Last visit, we increased his omeprazole to .  Acid reflux has resolved.  He has done well on this dose.  No symptoms.  Discussed decreasing the dose back down to .  He is trying to stay active.  No cardiac symptoms with increased activity or exertion.  No sob.  Discussed diet and exercise.  Recent LDL wnl.  No abdominal pain or cramping.  Bowels stable.     Past Medical History  Diagnosis Date  . Hypertension   . Hypercholesterolemia   . Iron deficiency anemia   . Hyperglycemia   . GERD (gastroesophageal reflux disease)    Past Surgical History  Procedure Laterality Date  . Inguinal hernia repair     Family History  Problem Relation Age of Onset  . Hypertension Mother   . Hypertension Father   . CVA Paternal Grandfather   . Colon cancer Neg Hx   . Prostate cancer Neg Hx    Social History   Social History  . Marital Status: Married    Spouse Name: N/A  . Number of Children: 2  . Years of Education: N/A   Social History Main Topics  . Smoking status: Never Smoker   . Smokeless tobacco: Never Used  . Alcohol Use: No  . Drug Use: No  . Sexual Activity: Not Asked   Other Topics Concern  . None   Social History Narrative    Outpatient Encounter Prescriptions as of 06/19/2015  Medication Sig  . aspirin 81 MG tablet Take 81 mg by mouth daily.  Marland Kitchen atorvastatin (LIPITOR) 40 MG tablet Take 1 tablet (40 mg total) by mouth daily.  . ferrous sulfate 325 (65 FE) MG tablet Take 325 mg by mouth daily.  . Lancets (ACCU-CHEK SOFT TOUCH) lancets Use as instructed  . metoprolol succinate (TOPROL-XL) 25 MG 24 hr tablet Take 1 tablet (25 mg total) by mouth daily.  Marland Kitchen  olmesartan-hydrochlorothiazide (BENICAR HCT) 40-12.5 MG per tablet Take 1 tablet by mouth daily.  Marland Kitchen omeprazole (PRILOSEC) 20 MG capsule Take 20 mg by mouth daily.  . [DISCONTINUED] omeprazole (PRILOSEC) 40 MG capsule Take 1 capsule (40 mg total) by mouth daily. (Patient not taking: Reported on 06/19/2015)   No facility-administered encounter medications on file as of 06/19/2015.    Review of Systems  Constitutional: Negative for appetite change and unexpected weight change.  HENT: Negative for congestion and sinus pressure.   Eyes: Negative for pain and visual disturbance.  Respiratory: Negative for cough, chest tightness and shortness of breath.   Cardiovascular: Negative for chest pain, palpitations and leg swelling.  Gastrointestinal: Negative for nausea, vomiting, abdominal pain and diarrhea.       No acid reflux now.    Genitourinary: Negative for dysuria and difficulty urinating.  Musculoskeletal: Negative for back pain and joint swelling.  Skin: Negative for color change and rash.  Neurological: Negative for dizziness, light-headedness and headaches.  Psychiatric/Behavioral: Negative for dysphoric mood and agitation.       Objective:    Physical Exam  Constitutional: He appears well-developed and well-nourished. No distress.  HENT:  Nose: Nose normal.  Mouth/Throat: Oropharynx  is clear and moist.  Eyes: Conjunctivae are normal. Right eye exhibits no discharge. Left eye exhibits no discharge.  Neck: Neck supple. No thyromegaly present.  Cardiovascular: Normal rate and regular rhythm.   Pulmonary/Chest: Effort normal and breath sounds normal. No respiratory distress.  Abdominal: Soft. Bowel sounds are normal. There is no tenderness.  Musculoskeletal: He exhibits no edema or tenderness.  Lymphadenopathy:    He has no cervical adenopathy.  Skin: No rash noted. No erythema.  Psychiatric: He has a normal mood and affect. His behavior is normal.    BP 122/80 mmHg  Pulse 86   Temp(Src) 98.7 F (37.1 C) (Oral)  Ht 5' 9.5" (1.765 m)  Wt 241 lb 2 oz (109.374 kg)  BMI 35.11 kg/m2  SpO2 95% Wt Readings from Last 3 Encounters:  06/19/15 241 lb 2 oz (109.374 kg)  03/12/15 244 lb 6 oz (110.848 kg)  12/29/14 245 lb 2 oz (111.188 kg)     Lab Results  Component Value Date   WBC 8.8 12/27/2014   HGB 14.8 12/27/2014   HCT 42.7 12/27/2014   PLT 329.0 12/27/2014   GLUCOSE 121* 06/18/2015   CHOL 139 06/18/2015   TRIG 148.0 06/18/2015   HDL 36.50* 06/18/2015   LDLCALC 73 06/18/2015   ALT 21 06/18/2015   AST 16 06/18/2015   NA 139 06/18/2015   K 4.1 06/18/2015   CL 102 06/18/2015   CREATININE 0.95 06/18/2015   BUN 15 06/18/2015   CO2 32 06/18/2015   TSH 2.96 02/13/2014   PSA 1.34 06/19/2014   HGBA1C 6.7* 06/18/2015   MICROALBUR 3.2* 02/13/2014    Dg Ankle Complete Left  03/13/2015   CLINICAL DATA:  49 year old with left ankle pain and swelling status post twisting injury while golfing. Lateral side symptoms. Initial encounter.  EXAM: LEFT ANKLE COMPLETE - 3+ VIEW  COMPARISON:  None.  FINDINGS: There is a chronic appearing posterior tibia exostosis which might be sequelae of remote trauma. Mortise joint alignment is preserved. Talar dome intact. No definite joint effusion. Calcaneus intact with degenerative spurring. Left fibula appears intact. No acute fracture or dislocation identified.  IMPRESSION: 1. No acute fracture or dislocation identified about the left ankle. 2. 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   Electronically Signed   By: Odessa Fleming M.D.   On: 03/13/2015 08:48       Assessment & Plan:   Problem List Items Addressed This Visit    Diabetes mellitus    Sugars have been doing better.  Low carb diet and exercise.  Follow.       Relevant Orders   TSH   Hemoglobin A1c   Basic metabolic panel   Microalbumin / creatinine urine ratio   Enlarged prostate    Followed by Dr Lonna Cobb.  S/p biopsy - ok.          Relevant Orders   PSA   GERD (gastroesophageal reflux disease)    No acid reflux on  of prilosec.  Will decrease to  of prilosec.  Follow.  Notify me if problems.        Relevant Medications   omeprazole (PRILOSEC) 20 MG capsule   Hypercholesterolemia    On lipitor.  Low cholesterol diet and exercise.  Follow lipid panel and liver function tests.   Lab Results  Component Value Date   CHOL 139 06/18/2015   HDL 36.50* 06/18/2015   LDLCALC 73 06/18/2015   TRIG 148.0 06/18/2015  CHOLHDL 4 06/18/2015        Relevant Orders   Lipid panel   Hepatic function panel   Hypertension - Primary    Blood pressure under good control.  Continue same medication regimen.  Follow pressures.  Follow metabolic panel.        Relevant Orders   CBC with Differential/Platelet   Ferritin   Left ankle pain    Xray as outlined.  He had declined further evaluation.  Pain resolved.            Dale Pleasanton, MD

## 2015-06-22 LAB — HEMOGLOBIN A1C: HEMOGLOBIN A1C: 6.7 % — AB (ref 4.6–6.5)

## 2015-06-26 ENCOUNTER — Encounter: Payer: Self-pay | Admitting: Internal Medicine

## 2015-06-26 NOTE — Assessment & Plan Note (Signed)
On lipitor.  Low cholesterol diet and exercise.  Follow lipid panel and liver function tests.   Lab Results  Component Value Date   CHOL 139 06/18/2015   HDL 36.50* 06/18/2015   LDLCALC 73 06/18/2015   TRIG 148.0 06/18/2015   CHOLHDL 4 06/18/2015

## 2015-06-26 NOTE — Assessment & Plan Note (Signed)
No acid reflux on  of prilosec.  Will decrease to  of prilosec.  Follow.  Notify me if problems.

## 2015-06-26 NOTE — Assessment & Plan Note (Signed)
Xray as outlined.  He had declined further evaluation.  Pain resolved.

## 2015-06-26 NOTE — Assessment & Plan Note (Signed)
Blood pressure under good control.  Continue same medication regimen.  Follow pressures.  Follow metabolic panel.   

## 2015-06-26 NOTE — Assessment & Plan Note (Signed)
Sugars have been doing better.  Low carb diet and exercise.  Follow.

## 2015-06-26 NOTE — Assessment & Plan Note (Signed)
Followed by Dr Lonna Cobb.  S/p biopsy - ok.

## 2015-07-03 ENCOUNTER — Other Ambulatory Visit: Payer: Self-pay

## 2015-07-03 ENCOUNTER — Telehealth: Payer: Self-pay | Admitting: Internal Medicine

## 2015-07-03 MED ORDER — ATORVASTATIN CALCIUM 40 MG PO TABS: 40.0000 mg | ORAL_TABLET | Freq: Every day | ORAL | Status: DC

## 2015-07-03 NOTE — Telephone Encounter (Signed)
atorvastatin (LIPITOR) 40 MG tablet 90 tablet

## 2015-07-03 NOTE — Telephone Encounter (Signed)
Refill completed.

## 2015-08-20 ENCOUNTER — Other Ambulatory Visit: Payer: Self-pay | Admitting: Internal Medicine

## 2015-10-01 ENCOUNTER — Telehealth: Payer: Self-pay | Admitting: Internal Medicine

## 2015-10-01 MED ORDER — LOSARTAN POTASSIUM-HCTZ 100-12.5 MG PO TABS: 1.0000 | ORAL_TABLET | Freq: Every day | ORAL | Status: DC

## 2015-10-01 NOTE — Telephone Encounter (Signed)
rx sent in for losartan/hctz 100/12.5 #90 with one refill.  Insurance requested change from benicar/hctz.

## 2015-10-09 ENCOUNTER — Telehealth: Payer: Self-pay | Admitting: Internal Medicine

## 2015-10-09 NOTE — Telephone Encounter (Signed)
Patient needed clarification

## 2015-10-09 NOTE — Telephone Encounter (Signed)
Pt called about wanting to know what medication was on paper? I think she is talking about medication losartan-hydrochlorothiazide (HYZAAR) 100-12.5 MG tablet.  Call pt @ [phone]. Thank You!

## 2015-11-17 ENCOUNTER — Other Ambulatory Visit: Payer: Self-pay | Admitting: Internal Medicine

## 2015-12-31 ENCOUNTER — Other Ambulatory Visit (INDEPENDENT_AMBULATORY_CARE_PROVIDER_SITE_OTHER): Payer: BLUE CROSS/BLUE SHIELD

## 2015-12-31 DIAGNOSIS — E7800 Pure hypercholesterolemia, unspecified: Secondary | ICD-10-CM | POA: Diagnosis not present

## 2015-12-31 DIAGNOSIS — I10 Essential (primary) hypertension: Secondary | ICD-10-CM

## 2015-12-31 DIAGNOSIS — E119 Type 2 diabetes mellitus without complications: Secondary | ICD-10-CM | POA: Diagnosis not present

## 2015-12-31 DIAGNOSIS — N400 Enlarged prostate without lower urinary tract symptoms: Secondary | ICD-10-CM

## 2015-12-31 LAB — CBC WITH DIFFERENTIAL/PLATELET
BASOS ABS: 0 10*3/uL (ref 0.0–0.1)
Basophils Relative: 0.4 % (ref 0.0–3.0)
Eosinophils Absolute: 0.2 10*3/uL (ref 0.0–0.7)
Eosinophils Relative: 1.3 % (ref 0.0–5.0)
HEMATOCRIT: 42.7 % (ref 39.0–52.0)
Hemoglobin: 14.5 g/dL (ref 13.0–17.0)
LYMPHS PCT: 18.6 % (ref 12.0–46.0)
Lymphs Abs: 2.2 10*3/uL (ref 0.7–4.0)
MCHC: 34 g/dL (ref 30.0–36.0)
MCV: 87.6 fl (ref 78.0–100.0)
MONOS PCT: 5.2 % (ref 3.0–12.0)
Monocytes Absolute: 0.6 10*3/uL (ref 0.1–1.0)
Neutro Abs: 8.8 10*3/uL — ABNORMAL HIGH (ref 1.4–7.7)
Neutrophils Relative %: 74.5 % (ref 43.0–77.0)
Platelets: 329 10*3/uL (ref 150.0–400.0)
RBC: 4.88 Mil/uL (ref 4.22–5.81)
RDW: 13.9 % (ref 11.5–15.5)
WBC: 11.8 10*3/uL — ABNORMAL HIGH (ref 4.0–10.5)

## 2015-12-31 LAB — BASIC METABOLIC PANEL
BUN: 19 mg/dL (ref 6–23)
CO2: 30 mEq/L (ref 19–32)
Calcium: 9.5 mg/dL (ref 8.4–10.5)
Chloride: 105 mEq/L (ref 96–112)
Creatinine, Ser: 1.07 mg/dL (ref 0.40–1.50)
GFR: 77.54 mL/min (ref >60.00)
GLUCOSE: 136 mg/dL — AB (ref 70–99)
POTASSIUM: 4.3 meq/L (ref 3.5–5.1)
SODIUM: 143 meq/L (ref 135–145)

## 2015-12-31 LAB — LIPID PANEL
Cholesterol: 158 mg/dL (ref 0–200)
HDL: 38.5 mg/dL — AB (ref >39.00)
LDL Cholesterol: 94 mg/dL (ref 0–99)
NONHDL: 119.53
Total CHOL/HDL Ratio: 4
Triglycerides: 129 mg/dL (ref 0.0–149.0)
VLDL: 25.8 mg/dL (ref 0.0–40.0)

## 2015-12-31 LAB — PSA: PSA: 1.38 ng/mL (ref 0.10–4.00)

## 2015-12-31 LAB — FERRITIN: FERRITIN: 134 ng/mL (ref 22.0–322.0)

## 2015-12-31 LAB — MICROALBUMIN / CREATININE URINE RATIO
Creatinine,U: 286.5 mg/dL
MICROALB UR: 1.5 mg/dL (ref 0.0–1.9)
MICROALB/CREAT RATIO: 0.5 mg/g (ref 0.0–30.0)

## 2015-12-31 LAB — HEPATIC FUNCTION PANEL
ALT: 20 U/L (ref 0–53)
AST: 14 U/L (ref 0–37)
Albumin: 4 g/dL (ref 3.5–5.2)
Alkaline Phosphatase: 80 U/L (ref 39–117)
BILIRUBIN TOTAL: 0.5 mg/dL (ref 0.2–1.2)
Bilirubin, Direct: 0.1 mg/dL (ref 0.0–0.3)
Total Protein: 7.1 g/dL (ref 6.0–8.3)

## 2015-12-31 LAB — HEMOGLOBIN A1C: Hgb A1c MFr Bld: 7 % — ABNORMAL HIGH (ref 4.6–6.5)

## 2015-12-31 LAB — TSH: TSH: 2.08 u[IU]/mL (ref 0.35–4.50)

## 2016-01-04 ENCOUNTER — Ambulatory Visit (INDEPENDENT_AMBULATORY_CARE_PROVIDER_SITE_OTHER): Payer: BLUE CROSS/BLUE SHIELD | Admitting: Internal Medicine

## 2016-01-04 ENCOUNTER — Encounter: Payer: Self-pay | Admitting: Internal Medicine

## 2016-01-04 VITALS — BP 132/78 | HR 85 | Temp 98.2°F | Resp 18 | Ht 69.5 in | Wt 249.0 lb

## 2016-01-04 DIAGNOSIS — K219 Gastro-esophageal reflux disease without esophagitis: Secondary | ICD-10-CM

## 2016-01-04 DIAGNOSIS — E119 Type 2 diabetes mellitus without complications: Secondary | ICD-10-CM | POA: Diagnosis not present

## 2016-01-04 DIAGNOSIS — Z0000 Encounter for general adult medical examination without abnormal findings: Secondary | ICD-10-CM

## 2016-01-04 DIAGNOSIS — N400 Enlarged prostate without lower urinary tract symptoms: Secondary | ICD-10-CM

## 2016-01-04 DIAGNOSIS — I10 Essential (primary) hypertension: Secondary | ICD-10-CM

## 2016-01-04 DIAGNOSIS — D72829 Elevated white blood cell count, unspecified: Secondary | ICD-10-CM

## 2016-01-04 DIAGNOSIS — E7800 Pure hypercholesterolemia, unspecified: Secondary | ICD-10-CM

## 2016-01-04 MED ORDER — METFORMIN HCL 500 MG PO TABS: 500.0000 mg | ORAL_TABLET | Freq: Every day | ORAL | Status: DC

## 2016-01-04 NOTE — Progress Notes (Signed)
Pre-visit discussion using our clinic review tool. No additional management support is needed unless otherwise documented below in the visit note.  

## 2016-01-04 NOTE — Progress Notes (Signed)
Patient ID: [name]., male   DOB: 1966-05-04, 50 y.o.   MRN: [id number]   Subjective:    Patient ID: [name]., male    DOB: Feb 21, 1966, 50 y.o.   MRN: [id number]  HPI  Patient here for his physical exam.  He has been doing an increased amount of work.  Has worked 81 days straight.  He tries to stay active.  Does not exercise regularly.  Discussed diet and exercise.  No chest pain or tightness.  No sob.  No acid reflux.  No abdominal pain or cramping.  Bowels stable.  Has gained weight.     Past Medical History  Diagnosis Date  . Hypertension   . Hypercholesterolemia   . Iron deficiency anemia   . Hyperglycemia   . GERD (gastroesophageal reflux disease)    Past Surgical History  Procedure Laterality Date  . Inguinal hernia repair     Family History  Problem Relation Age of Onset  . Hypertension Mother   . Hypertension Father   . CVA Paternal Grandfather   . Colon cancer Neg Hx   . Prostate cancer Neg Hx    Social History   Social History  . Marital Status: Married    Spouse Name: N/A  . Number of Children: 2  . Years of Education: N/A   Social History Main Topics  . Smoking status: Never Smoker   . Smokeless tobacco: Never Used  . Alcohol Use: No  . Drug Use: No  . Sexual Activity: Not Asked   Other Topics Concern  . None   Social History Narrative    Outpatient Encounter Prescriptions as of 01/04/2016  Medication Sig  . aspirin 81 MG tablet Take 81 mg by mouth daily.  Marland Kitchen atorvastatin (LIPITOR) 40 MG tablet Take 1 tablet (40 mg total) by mouth daily.  . ferrous sulfate 325 (65 FE) MG tablet Take 325 mg by mouth daily.  . Lancets (ACCU-CHEK SOFT TOUCH) lancets Use as instructed  . losartan-hydrochlorothiazide (HYZAAR) 100-12.5 MG tablet Take 1 tablet by mouth daily.  . metoprolol succinate (TOPROL-XL) 25 MG 24 hr tablet Take 1 tablet (25 mg total) by mouth daily.  Marland Kitchen omeprazole (PRILOSEC) 20 MG capsule Take 20 mg by mouth daily.  Marland Kitchen omeprazole  (PRILOSEC) 40 MG capsule TAKE 1 CAPSULE DAILY  . metFORMIN (GLUCOPHAGE) 500 MG tablet Take 1 tablet (500 mg total) by mouth daily.   No facility-administered encounter medications on file as of 01/04/2016.    Review of Systems  Constitutional: Negative for appetite change and unexpected weight change.  HENT: Negative for congestion, sinus pressure and sore throat.   Eyes: Negative for pain and visual disturbance.  Respiratory: Negative for cough, chest tightness and shortness of breath.   Cardiovascular: Negative for chest pain, palpitations and leg swelling.  Gastrointestinal: Negative for nausea, vomiting, abdominal pain and diarrhea.  Genitourinary: Negative for dysuria and difficulty urinating.  Musculoskeletal: Negative for back pain and joint swelling.  Skin: Negative for color change and rash.  Neurological: Negative for dizziness, light-headedness and headaches.  Hematological: Negative for adenopathy. Does not bruise/bleed easily.  Psychiatric/Behavioral: Negative for dysphoric mood and agitation.       Objective:    Physical Exam  Constitutional: He is oriented to person, place, and time. He appears well-developed and well-nourished. No distress.  HENT:  Head: Normocephalic and atraumatic.  Nose: Nose normal.  Mouth/Throat: Oropharynx is clear and moist. No oropharyngeal exudate.  Eyes: Conjunctivae are normal. Right  eye exhibits no discharge. Left eye exhibits no discharge.  Neck: Neck supple. No thyromegaly present.  Cardiovascular: Normal rate and regular rhythm.   Pulmonary/Chest: Breath sounds normal. No respiratory distress. He has no wheezes.  Abdominal: Soft. Bowel sounds are normal. There is no tenderness.  Genitourinary:  Rectal exam - no palpable prostate nodules.  Heme negative.   Musculoskeletal: He exhibits no edema or tenderness.  Lymphadenopathy:    He has no cervical adenopathy.  Neurological: He is alert and oriented to person, place, and time.    Skin: Skin is warm and dry. No rash noted. No erythema.  Psychiatric: He has a normal mood and affect. His behavior is normal.    BP 132/78 mmHg  Pulse 85  Temp(Src) 98.2 F (36.8 C) (Oral)  Resp 18  Ht 5' 9.5" (1.765 m)  Wt 249 lb (112.946 kg)  BMI 36.26 kg/m2  SpO2 96% Wt Readings from Last 3 Encounters:  01/04/16 249 lb (112.946 kg)  06/19/15 241 lb 2 oz (109.374 kg)  03/12/15 244 lb 6 oz (110.848 kg)     Lab Results  Component Value Date   WBC 11.8* 12/31/2015   HGB 14.5 12/31/2015   HCT 42.7 12/31/2015   PLT 329.0 12/31/2015   GLUCOSE 136* 12/31/2015   CHOL 158 12/31/2015   TRIG 129.0 12/31/2015   HDL 38.50* 12/31/2015   LDLCALC 94 12/31/2015   ALT 20 12/31/2015   AST 14 12/31/2015   NA 143 12/31/2015   K 4.3 12/31/2015   CL 105 12/31/2015   CREATININE 1.07 12/31/2015   BUN 19 12/31/2015   CO2 30 12/31/2015   TSH 2.08 12/31/2015   PSA 1.38 12/31/2015   HGBA1C 7.0* 12/31/2015   MICROALBUR 1.5 12/31/2015        Assessment & Plan:   Problem List Items Addressed This Visit    Diabetes mellitus ([place])    Discussed low carb diet and exercise.  Discussed increased a1c.  Discussed medication.  Start metformin 585m q day.  Follow sugars.  Follow met b and a1c.  Keep up to date with eye exams.       Relevant Medications   metFORMIN (GLUCOPHAGE) 500 MG tablet   Enlarged prostate    Was followed by Dr SBernardo Heater  S/p biopsy - ok.  PSA as outlined - stable.       GERD (gastroesophageal reflux disease)    No acid reflux on current medication.  Follow.       Health care maintenance    Physical today 01/04/16.  Colonoscopy 2007.  psa 12/31/15 - 1.38.        Hypercholesterolemia    On lipitor.  Discussed diet and exercise.  Follow lipid panel and liver function tests.   Lab Results  Component Value Date   CHOL 158 12/31/2015   HDL 38.50* 12/31/2015   LDLCALC 94 12/31/2015   TRIG 129.0 12/31/2015   CHOLHDL 4 12/31/2015        Hypertension - Primary     Blood pressure under good control.  Continue same medication regimen.  Follow pressures.  Follow metabolic panel.            SEinar Pheasant MD

## 2016-01-05 ENCOUNTER — Encounter: Payer: Self-pay | Admitting: Internal Medicine

## 2016-01-05 NOTE — Assessment & Plan Note (Signed)
On lipitor.  Discussed diet and exercise.  Follow lipid panel and liver function tests.   Lab Results  Component Value Date   CHOL 158 12/31/2015   HDL 38.50* 12/31/2015   LDLCALC 94 12/31/2015   TRIG 129.0 12/31/2015   CHOLHDL 4 12/31/2015

## 2016-01-05 NOTE — Assessment & Plan Note (Signed)
No acid reflux on current medication.  Follow.

## 2016-01-05 NOTE — Assessment & Plan Note (Signed)
Physical today 01/04/16.  Colonoscopy 2007.  psa 12/31/15 - 1.38.

## 2016-01-05 NOTE — Assessment & Plan Note (Signed)
Discussed low carb diet and exercise.  Discussed increased a1c.  Discussed medication.  Start metformin 567m q day.  Follow sugars.  Follow met b and a1c.  Keep up to date with eye exams.

## 2016-01-05 NOTE — Assessment & Plan Note (Signed)
Was followed by Dr Lonna CobbStoioff.  S/p biopsy - ok.  PSA as outlined - stable.

## 2016-01-05 NOTE — Assessment & Plan Note (Signed)
Blood pressure under good control.  Continue same medication regimen.  Follow pressures.  Follow metabolic panel.   

## 2016-01-15 ENCOUNTER — Ambulatory Visit (INDEPENDENT_AMBULATORY_CARE_PROVIDER_SITE_OTHER): Payer: BLUE CROSS/BLUE SHIELD | Admitting: Internal Medicine

## 2016-01-15 ENCOUNTER — Encounter: Payer: Self-pay | Admitting: Internal Medicine

## 2016-01-15 VITALS — BP 120/80 | HR 69 | Temp 98.3°F | Resp 18 | Ht 69.5 in | Wt 235.2 lb

## 2016-01-15 DIAGNOSIS — E119 Type 2 diabetes mellitus without complications: Secondary | ICD-10-CM

## 2016-01-15 DIAGNOSIS — R197 Diarrhea, unspecified: Secondary | ICD-10-CM | POA: Diagnosis not present

## 2016-01-15 NOTE — Progress Notes (Signed)
Pre-visit discussion using our clinic review tool. No additional management support is needed unless otherwise documented below in the visit note.  

## 2016-01-15 NOTE — Patient Instructions (Signed)
Probiotics - align, florastor and phillips colon health

## 2016-01-15 NOTE — Progress Notes (Signed)
Patient ID: Aaron DeerLee R Stidham Jr., male   DOB: 09/16/1966, 50 y.o.   MRN: [id number]   Subjective:    Patient ID: Aaron DeerLee R Heap Jr., male    DOB: 11/17/1965, 50 y.o.   MRN: [id number]  HPI  Patient here as a work in with concerns regarding side effects to metformin.  He states that he started having fever and cough one week ago.  Then developed diarrhea.  The cough is better.  Fever resolved.  Eating and drinking better. Diarrhea resolved.  Still taking metformin.  Was questioning if symptoms related to metformin.     Past Medical History  Diagnosis Date  . Hypertension   . Hypercholesterolemia   . Iron deficiency anemia   . Hyperglycemia   . GERD (gastroesophageal reflux disease)    Past Surgical History  Procedure Laterality Date  . Inguinal hernia repair     Family History  Problem Relation Age of Onset  . Hypertension Mother   . Hypertension Father   . CVA Paternal Grandfather   . Colon cancer Neg Hx   . Prostate cancer Neg Hx    Social History   Social History  . Marital Status: Married    Spouse Name: N/A  . Number of Children: 2  . Years of Education: N/A   Social History Main Topics  . Smoking status: Never Smoker   . Smokeless tobacco: Never Used  . Alcohol Use: No  . Drug Use: No  . Sexual Activity: Not Asked   Other Topics Concern  . None   Social History Narrative    Outpatient Encounter Prescriptions as of 01/15/2016  Medication Sig  . aspirin 81 MG tablet Take 81 mg by mouth daily.  Marland Kitchen. atorvastatin (LIPITOR) 40 MG tablet Take 1 tablet (40 mg total) by mouth daily.  . ferrous sulfate 325 (65 FE) MG tablet Take 325 mg by mouth daily.  . Lancets (ACCU-CHEK SOFT TOUCH) lancets Use as instructed  . losartan-hydrochlorothiazide (HYZAAR) 100-12.5 MG tablet Take 1 tablet by mouth daily.  . metFORMIN (GLUCOPHAGE) 500 MG tablet Take 1 tablet (500 mg total) by mouth daily.  . metoprolol succinate (TOPROL-XL) 25 MG 24 hr tablet Take 1 tablet (25 mg total) by mouth  daily.  Marland Kitchen. omeprazole (PRILOSEC) 20 MG capsule Take 20 mg by mouth daily.  Marland Kitchen. omeprazole (PRILOSEC) 40 MG capsule TAKE 1 CAPSULE DAILY   No facility-administered encounter medications on file as of 01/15/2016.    Review of Systems  Constitutional:       Previous fever has resolved.  Decreased po intake.  Weight loss.    HENT: Negative for sinus pressure and sore throat.   Respiratory: Positive for cough. Negative for chest tightness and shortness of breath.   Cardiovascular: Negative for chest pain, palpitations and leg swelling.  Gastrointestinal: Positive for diarrhea. Negative for nausea, vomiting and abdominal pain.  Genitourinary: Negative for dysuria and difficulty urinating.  Musculoskeletal: Negative for back pain and joint swelling.  Skin: Negative for color change and rash.  Neurological: Negative for dizziness, light-headedness and headaches.  Psychiatric/Behavioral: Negative for dysphoric mood and agitation.       Objective:    Physical Exam  Constitutional: He appears well-developed and well-nourished. No distress.  HENT:  Nose: Nose normal.  Mouth/Throat: Oropharynx is clear and moist.  Neck: Neck supple.  Cardiovascular: Normal rate and regular rhythm.   Pulmonary/Chest: Effort normal and breath sounds normal. No respiratory distress.  Abdominal: Soft. Bowel sounds are normal. There is  no tenderness.  Musculoskeletal: He exhibits no edema.  Lymphadenopathy:    He has no cervical adenopathy.  Skin: No rash noted. No erythema.  Psychiatric: He has a normal mood and affect. His behavior is normal.    BP 120/80 mmHg  Pulse 69  Temp(Src) 98.3 F (36.8 C) (Oral)  Resp 18  Ht 5' 9.5" (1.765 m)  Wt 235 lb 4 oz (106.709 kg)  BMI 34.25 kg/m2  SpO2 97% Wt Readings from Last 3 Encounters:  01/15/16 235 lb 4 oz (106.709 kg)  01/04/16 249 lb (112.946 kg)  06/19/15 241 lb 2 oz (109.374 kg)     Lab Results  Component Value Date   WBC 11.8* 12/31/2015   HGB 14.5  12/31/2015   HCT 42.7 12/31/2015   PLT 329.0 12/31/2015   GLUCOSE 136* 12/31/2015   CHOL 158 12/31/2015   TRIG 129.0 12/31/2015   HDL 38.50* 12/31/2015   LDLCALC 94 12/31/2015   ALT 20 12/31/2015   AST 14 12/31/2015   NA 143 12/31/2015   K 4.3 12/31/2015   CL 105 12/31/2015   CREATININE 1.07 12/31/2015   BUN 19 12/31/2015   CO2 30 12/31/2015   TSH 2.08 12/31/2015   PSA 1.38 12/31/2015   HGBA1C 7.0* 12/31/2015   MICROALBUR 1.5 12/31/2015        Assessment & Plan:   Problem List Items Addressed This Visit    Diabetes mellitus ([facility]) - Primary    On metformin.  Do not feel his recent symptoms are related to the metformin.  Symptoms have improved.  Still taking metformin.  Follow sugars.        Diarrhea    Symptoms as outlined with associated cough, etc.  Better now.  Feel probably related to viral infection (possible infuenza).  Rest.  Fluids.  Bland foods.  Advance as tolerated.  Probiotic as directed.  Follow.  Notify me if persistent symptoms.            Dale Velma, MD

## 2016-01-21 ENCOUNTER — Encounter: Payer: Self-pay | Admitting: Internal Medicine

## 2016-01-21 DIAGNOSIS — R197 Diarrhea, unspecified: Secondary | ICD-10-CM | POA: Insufficient documentation

## 2016-01-21 NOTE — Assessment & Plan Note (Signed)
Symptoms as outlined with associated cough, etc.  Better now.  Feel probably related to viral infection (possible infuenza).  Rest.  Fluids.  Bland foods.  Advance as tolerated.  Probiotic as directed.  Follow.  Notify me if persistent symptoms.

## 2016-01-21 NOTE — Assessment & Plan Note (Signed)
On metformin.  Do not feel his recent symptoms are related to the metformin.  Symptoms have improved.  Still taking metformin.  Follow sugars.

## 2016-02-06 ENCOUNTER — Other Ambulatory Visit (INDEPENDENT_AMBULATORY_CARE_PROVIDER_SITE_OTHER): Payer: BLUE CROSS/BLUE SHIELD

## 2016-02-06 DIAGNOSIS — D72829 Elevated white blood cell count, unspecified: Secondary | ICD-10-CM | POA: Diagnosis not present

## 2016-02-08 LAB — CBC WITH DIFFERENTIAL/PLATELET
BASOS ABS: 0.1 10*3/uL (ref 0.0–0.1)
BASOS PCT: 1 % (ref 0.0–3.0)
EOS ABS: 0.4 10*3/uL (ref 0.0–0.7)
Eosinophils Relative: 4.9 % (ref 0.0–5.0)
HCT: 36.9 % — ABNORMAL LOW (ref 39.0–52.0)
Hemoglobin: 12.6 g/dL — ABNORMAL LOW (ref 13.0–17.0)
Lymphocytes Relative: 32.1 % (ref 12.0–46.0)
Lymphs Abs: 2.6 10*3/uL (ref 0.7–4.0)
MCHC: 34.2 g/dL (ref 30.0–36.0)
MCV: 87.2 fl (ref 78.0–100.0)
MONO ABS: 0.5 10*3/uL (ref 0.1–1.0)
Monocytes Relative: 6.6 % (ref 3.0–12.0)
NEUTROS ABS: 4.5 10*3/uL (ref 1.4–7.7)
Neutrophils Relative %: 55.4 % (ref 43.0–77.0)
Platelets: 290 10*3/uL (ref 150.0–400.0)
RBC: 4.23 Mil/uL (ref 4.22–5.81)
RDW: 13.6 % (ref 11.5–15.5)
WBC: 8.1 10*3/uL (ref 4.0–10.5)

## 2016-02-11 ENCOUNTER — Other Ambulatory Visit: Payer: Self-pay | Admitting: Internal Medicine

## 2016-02-11 ENCOUNTER — Telehealth: Payer: Self-pay | Admitting: *Deleted

## 2016-02-11 DIAGNOSIS — D649 Anemia, unspecified: Secondary | ICD-10-CM

## 2016-02-11 NOTE — Telephone Encounter (Addendum)
Patient wife returned the call in reference to lab results on 05/10, She will be at [phone]

## 2016-02-11 NOTE — Progress Notes (Signed)
Order placed for f/u labs.  

## 2016-02-11 NOTE — Telephone Encounter (Signed)
Spoke with patient, see result note

## 2016-02-12 ENCOUNTER — Telehealth: Payer: Self-pay | Admitting: Internal Medicine

## 2016-02-12 NOTE — Telephone Encounter (Signed)
Strips re-ordered

## 2016-02-12 NOTE — Telephone Encounter (Signed)
Pt's wife dropped off blood sugar readings from pt. Paper is in Dr. Roby LoftsScott's box.

## 2016-02-12 NOTE — Telephone Encounter (Signed)
Pt would like a box of his test stripes called into Pharmacy. Pharmacy is [facility] on [address].

## 2016-02-13 NOTE — Telephone Encounter (Signed)
Placed in yellow folder.  

## 2016-02-14 NOTE — Telephone Encounter (Signed)
Spoke with daughter & notified her

## 2016-02-14 NOTE — Telephone Encounter (Signed)
Reviewed blood sugars.  Improved.  Continue low carb diet and exercise.  Continue to monitor.

## 2016-02-15 ENCOUNTER — Other Ambulatory Visit: Payer: Self-pay | Admitting: Internal Medicine

## 2016-02-27 ENCOUNTER — Other Ambulatory Visit (INDEPENDENT_AMBULATORY_CARE_PROVIDER_SITE_OTHER): Payer: BLUE CROSS/BLUE SHIELD

## 2016-02-27 DIAGNOSIS — D649 Anemia, unspecified: Secondary | ICD-10-CM

## 2016-02-28 LAB — IBC PANEL
IRON: 66 ug/dL (ref 42–165)
Saturation Ratios: 20.7 % (ref 20.0–50.0)
TRANSFERRIN: 228 mg/dL (ref 212.0–360.0)

## 2016-02-28 LAB — CBC WITH DIFFERENTIAL/PLATELET
BASOS PCT: 1 % (ref 0.0–3.0)
Basophils Absolute: 0.1 10*3/uL (ref 0.0–0.1)
EOS ABS: 0.3 10*3/uL (ref 0.0–0.7)
Eosinophils Relative: 3.6 % (ref 0.0–5.0)
HCT: 38.4 % — ABNORMAL LOW (ref 39.0–52.0)
Hemoglobin: 13.1 g/dL (ref 13.0–17.0)
LYMPHS ABS: 2.7 10*3/uL (ref 0.7–4.0)
Lymphocytes Relative: 31.3 % (ref 12.0–46.0)
MCHC: 34 g/dL (ref 30.0–36.0)
MCV: 87.4 fl (ref 78.0–100.0)
MONO ABS: 0.7 10*3/uL (ref 0.1–1.0)
Monocytes Relative: 7.7 % (ref 3.0–12.0)
NEUTROS ABS: 4.9 10*3/uL (ref 1.4–7.7)
Neutrophils Relative %: 56.4 % (ref 43.0–77.0)
PLATELETS: 330 10*3/uL (ref 150.0–400.0)
RBC: 4.39 Mil/uL (ref 4.22–5.81)
RDW: 13.9 % (ref 11.5–15.5)
WBC: 8.6 10*3/uL (ref 4.0–10.5)

## 2016-02-28 LAB — VITAMIN B12: Vitamin B-12: 409 pg/mL (ref 211–911)

## 2016-03-03 ENCOUNTER — Telehealth: Payer: Self-pay | Admitting: *Deleted

## 2016-03-03 NOTE — Telephone Encounter (Signed)
Pt wife called back returning your call. Thank you!

## 2016-03-03 NOTE — Telephone Encounter (Signed)
Spoke with the patients wife, verbalized labs and understanding. thanks

## 2016-03-03 NOTE — Telephone Encounter (Signed)
Wife requested lab results from 05/30 [phone]

## 2016-03-03 NOTE — Telephone Encounter (Signed)
Left a VM to return my call. 

## 2016-03-06 ENCOUNTER — Other Ambulatory Visit: Payer: Self-pay | Admitting: *Deleted

## 2016-03-06 MED ORDER — METOPROLOL SUCCINATE ER 25 MG PO TB24: 25.0000 mg | ORAL_TABLET | Freq: Every day | ORAL | Status: DC

## 2016-03-12 ENCOUNTER — Other Ambulatory Visit: Payer: Self-pay | Admitting: Internal Medicine

## 2016-03-14 DIAGNOSIS — D3132 Benign neoplasm of left choroid: Secondary | ICD-10-CM | POA: Diagnosis not present

## 2016-05-21 ENCOUNTER — Encounter (INDEPENDENT_AMBULATORY_CARE_PROVIDER_SITE_OTHER): Payer: Self-pay

## 2016-05-21 ENCOUNTER — Encounter: Payer: Self-pay | Admitting: Internal Medicine

## 2016-05-21 ENCOUNTER — Ambulatory Visit (INDEPENDENT_AMBULATORY_CARE_PROVIDER_SITE_OTHER): Payer: BLUE CROSS/BLUE SHIELD | Admitting: Internal Medicine

## 2016-05-21 DIAGNOSIS — D649 Anemia, unspecified: Secondary | ICD-10-CM

## 2016-05-21 DIAGNOSIS — K219 Gastro-esophageal reflux disease without esophagitis: Secondary | ICD-10-CM | POA: Diagnosis not present

## 2016-05-21 DIAGNOSIS — E7800 Pure hypercholesterolemia, unspecified: Secondary | ICD-10-CM

## 2016-05-21 DIAGNOSIS — E119 Type 2 diabetes mellitus without complications: Secondary | ICD-10-CM | POA: Diagnosis not present

## 2016-05-21 DIAGNOSIS — I10 Essential (primary) hypertension: Secondary | ICD-10-CM | POA: Diagnosis not present

## 2016-05-21 NOTE — Assessment & Plan Note (Signed)
On omeprazole.  No upper symptoms.   

## 2016-05-21 NOTE — Progress Notes (Signed)
Patient ID: Aaron Headland., male   DOB: 22-Jul-1966, 50 y.o.   MRN: [id number]   Subjective:    Patient ID: Aaron Headland., male    DOB: Apr 21, 1966, 50 y.o.   MRN: [id number]  HPI  Patient here for a scheduled follow up.  He is doing well.  Has not been checking his sugars regularly.  Does state when he checks, am sugars averaging 110-115 and pm sugars 120s.  No chest pain.  No sob.  No abdominal pain or cramping.  Bowels stable.  No diarrhea.  This resolved.  Eating and drinking well.  Discussed diet and exercise.     Past Medical History:  Diagnosis Date  . GERD (gastroesophageal reflux disease)   . Hypercholesterolemia   . Hyperglycemia   . Hypertension   . Iron deficiency anemia    Past Surgical History:  Procedure Laterality Date  . INGUINAL HERNIA REPAIR     Family History  Problem Relation Age of Onset  . Hypertension Mother   . Hypertension Father   . CVA Paternal Grandfather   . Colon cancer Neg Hx   . Prostate cancer Neg Hx    Social History   Social History  . Marital status: Married    Spouse name: N/A  . Number of children: 2  . Years of education: N/A   Social History Main Topics  . Smoking status: Never Smoker  . Smokeless tobacco: Never Used  . Alcohol use No  . Drug use: No  . Sexual activity: Not Asked   Other Topics Concern  . None   Social History Narrative  . None    Outpatient Encounter Prescriptions as of 05/21/2016  Medication Sig  . aspirin 81 MG tablet Take 81 mg by mouth daily.  Marland Kitchen atorvastatin (LIPITOR) 40 MG tablet Take 1 tablet (40 mg total) by mouth daily.  . ferrous sulfate 325 (65 FE) MG tablet Take 325 mg by mouth daily.  . Lancets (ACCU-CHEK SOFT TOUCH) lancets Use as instructed  . losartan-hydrochlorothiazide (HYZAAR) 100-12.5 MG tablet TAKE 1 TABLET DAILY  . metFORMIN (GLUCOPHAGE) 500 MG tablet Take 1 tablet (500 mg total) by mouth daily.  . metoprolol succinate (TOPROL-XL) 25 MG 24 hr tablet Take 1 tablet (25 mg total)  by mouth daily.  Marland Kitchen omeprazole (PRILOSEC) 40 MG capsule TAKE 1 CAPSULE DAILY  . [DISCONTINUED] omeprazole (PRILOSEC) 20 MG capsule Take 20 mg by mouth daily.   No facility-administered encounter medications on file as of 05/21/2016.     Review of Systems  Constitutional: Negative for appetite change and unexpected weight change.  HENT: Negative for congestion and sinus pressure.   Respiratory: Negative for cough, chest tightness and shortness of breath.   Cardiovascular: Negative for chest pain, palpitations and leg swelling.  Gastrointestinal: Negative for abdominal pain, diarrhea, nausea and vomiting.  Genitourinary: Negative for difficulty urinating and dysuria.  Musculoskeletal: Negative for back pain and joint swelling.  Skin:       Few healing lesions - lower legs.  Were deep.  Healing from inside out.  No infection.    Neurological: Negative for dizziness, light-headedness and headaches.  Psychiatric/Behavioral: Negative for agitation and dysphoric mood.       Objective:    Physical Exam  Constitutional: He appears well-developed and well-nourished. No distress.  HENT:  Nose: Nose normal.  Mouth/Throat: Oropharynx is clear and moist.  Neck: Neck supple. No thyromegaly present.  Cardiovascular: Normal rate and regular rhythm.   Pulmonary/Chest: Effort  normal and breath sounds normal. No respiratory distress.  Abdominal: Soft. Bowel sounds are normal. There is no tenderness.  Musculoskeletal: He exhibits no edema.  Lymphadenopathy:    He has no cervical adenopathy.  Skin: No rash noted. No erythema.  Three open healing lesions lower legs.  No increased erythema or warmth.    Psychiatric: He has a normal mood and affect. His behavior is normal.    BP 120/70   Pulse 71   Temp 98.1 F (36.7 C) (Oral)   Resp 18   Ht 5' 9.5" (1.765 m)   Wt 232 lb (105.2 kg)   SpO2 98%   BMI 33.77 kg/m  Wt Readings from Last 3 Encounters:  05/21/16 232 lb (105.2 kg)  01/15/16 235 lb  4 oz (106.7 kg)  01/04/16 249 lb (112.9 kg)     Lab Results  Component Value Date   WBC 8.6 02/27/2016   HGB 13.1 02/27/2016   HCT 38.4 (L) 02/27/2016   PLT 330.0 02/27/2016   GLUCOSE 136 (H) 12/31/2015   CHOL 158 12/31/2015   TRIG 129.0 12/31/2015   HDL 38.50 (L) 12/31/2015   LDLCALC 94 12/31/2015   ALT 20 12/31/2015   AST 14 12/31/2015   NA 143 12/31/2015   K 4.3 12/31/2015   CL 105 12/31/2015   CREATININE 1.07 12/31/2015   BUN 19 12/31/2015   CO2 30 12/31/2015   TSH 2.08 12/31/2015   PSA 1.38 12/31/2015   HGBA1C 7.0 (H) 12/31/2015   MICROALBUR 1.5 12/31/2015    Dg Ankle Complete Left  Result Date: 03/13/2015 CLINICAL DATA:  50 year old with left ankle pain and swelling status post twisting injury while golfing. Lateral side symptoms. Initial encounter. EXAM: LEFT ANKLE COMPLETE - 3+ VIEW COMPARISON:  None. FINDINGS: There is a chronic appearing posterior tibia exostosis which might be sequelae of remote trauma. Mortise joint alignment is preserved. Talar dome intact. No definite joint effusion. Calcaneus intact with degenerative spurring. Left fibula appears intact. No acute fracture or dislocation identified. IMPRESSION: 1. No acute fracture or dislocation identified about the left ankle. 2. 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 Electronically Signed   By: Genevie Ann M.D.   On: 03/13/2015 08:48       Assessment & Plan:   Problem List Items Addressed This Visit    Anemia    Most recent cbc - hgb wnl.       Diabetes mellitus ([place])    Discussed diet and exercise.  Low carb diet.  Follow met b and a1c.  Last a1c 7.0.  Sugars as outlined.        Relevant Orders   Hemoglobin O5D   Basic metabolic panel   GERD (gastroesophageal reflux disease)    On omeprazole.  No upper symptoms.       Hypercholesterolemia    Low cholesterol diet and exercise.  Follow lipid panel and liver function tests.  On lipitor.  Will check labs  in a couple of months.        Relevant Orders   Lipid panel   Hepatic function panel   Hypertension    Blood pressure under good control.  Continue same medication regimen.  Follow pressures.  Follow metabolic panel.         Other Visit Diagnoses   None.      Einar Pheasant, MD

## 2016-05-21 NOTE — Assessment & Plan Note (Signed)
Most recent cbc - hgb wnl.

## 2016-05-21 NOTE — Progress Notes (Signed)
Pre-visit discussion using our clinic review tool. No additional management support is needed unless otherwise documented below in the visit note.  

## 2016-05-21 NOTE — Assessment & Plan Note (Signed)
Low cholesterol diet and exercise.  Follow lipid panel and liver function tests.  On lipitor.  Will check labs in a couple of months.

## 2016-05-21 NOTE — Assessment & Plan Note (Signed)
Discussed diet and exercise.  Low carb diet.  Follow met b and a1c.  Last a1c 7.0.  Sugars as outlined.

## 2016-05-21 NOTE — Assessment & Plan Note (Signed)
Blood pressure under good control.  Continue same medication regimen.  Follow pressures.  Follow metabolic panel.   

## 2016-06-04 ENCOUNTER — Other Ambulatory Visit: Payer: Self-pay | Admitting: Internal Medicine

## 2016-06-24 DIAGNOSIS — X32XXXA Exposure to sunlight, initial encounter: Secondary | ICD-10-CM | POA: Diagnosis not present

## 2016-06-24 DIAGNOSIS — L570 Actinic keratosis: Secondary | ICD-10-CM | POA: Diagnosis not present

## 2016-06-24 DIAGNOSIS — L821 Other seborrheic keratosis: Secondary | ICD-10-CM | POA: Diagnosis not present

## 2016-06-24 DIAGNOSIS — D2261 Melanocytic nevi of right upper limb, including shoulder: Secondary | ICD-10-CM | POA: Diagnosis not present

## 2016-06-24 DIAGNOSIS — D224 Melanocytic nevi of scalp and neck: Secondary | ICD-10-CM | POA: Diagnosis not present

## 2016-06-24 DIAGNOSIS — L309 Dermatitis, unspecified: Secondary | ICD-10-CM | POA: Diagnosis not present

## 2016-07-21 ENCOUNTER — Other Ambulatory Visit (INDEPENDENT_AMBULATORY_CARE_PROVIDER_SITE_OTHER): Payer: BLUE CROSS/BLUE SHIELD

## 2016-07-21 DIAGNOSIS — E119 Type 2 diabetes mellitus without complications: Secondary | ICD-10-CM | POA: Diagnosis not present

## 2016-07-21 DIAGNOSIS — E7800 Pure hypercholesterolemia, unspecified: Secondary | ICD-10-CM

## 2016-07-21 LAB — LIPID PANEL
CHOL/HDL RATIO: 4
Cholesterol: 135 mg/dL (ref 0–200)
HDL: 35.5 mg/dL — ABNORMAL LOW (ref >39.00)
LDL CALC: 80 mg/dL (ref 0–99)
NONHDL: 99.9
TRIGLYCERIDES: 101 mg/dL (ref 0.0–149.0)
VLDL: 20.2 mg/dL (ref 0.0–40.0)

## 2016-07-21 LAB — BASIC METABOLIC PANEL
BUN: 15 mg/dL (ref 6–23)
CHLORIDE: 104 meq/L (ref 96–112)
CO2: 31 meq/L (ref 19–32)
Calcium: 8.6 mg/dL (ref 8.4–10.5)
Creatinine, Ser: 1.06 mg/dL (ref 0.40–1.50)
GFR: 78.22 mL/min (ref >60.00)
GLUCOSE: 110 mg/dL — AB (ref 70–99)
POTASSIUM: 3.5 meq/L (ref 3.5–5.1)
SODIUM: 140 meq/L (ref 135–145)

## 2016-07-21 LAB — HEPATIC FUNCTION PANEL
ALK PHOS: 72 U/L (ref 39–117)
ALT: 16 U/L (ref 0–53)
AST: 14 U/L (ref 0–37)
Albumin: 3.8 g/dL (ref 3.5–5.2)
BILIRUBIN DIRECT: 0.1 mg/dL (ref 0.0–0.3)
BILIRUBIN TOTAL: 0.5 mg/dL (ref 0.2–1.2)
TOTAL PROTEIN: 6.8 g/dL (ref 6.0–8.3)

## 2016-07-21 LAB — HEMOGLOBIN A1C: Hgb A1c MFr Bld: 6.5 % (ref 4.6–6.5)

## 2016-07-22 ENCOUNTER — Telehealth: Payer: Self-pay | Admitting: Internal Medicine

## 2016-07-22 NOTE — Telephone Encounter (Signed)
Pt wife called requesting lab results. Thank you!  Call pt @ [phone]

## 2016-08-13 ENCOUNTER — Other Ambulatory Visit: Payer: Self-pay | Admitting: Internal Medicine

## 2016-09-08 ENCOUNTER — Other Ambulatory Visit: Payer: Self-pay | Admitting: Internal Medicine

## 2016-09-25 ENCOUNTER — Ambulatory Visit (INDEPENDENT_AMBULATORY_CARE_PROVIDER_SITE_OTHER): Payer: BLUE CROSS/BLUE SHIELD | Admitting: Internal Medicine

## 2016-09-25 ENCOUNTER — Encounter: Payer: Self-pay | Admitting: Internal Medicine

## 2016-09-25 DIAGNOSIS — N400 Enlarged prostate without lower urinary tract symptoms: Secondary | ICD-10-CM

## 2016-09-25 DIAGNOSIS — E119 Type 2 diabetes mellitus without complications: Secondary | ICD-10-CM

## 2016-09-25 DIAGNOSIS — I10 Essential (primary) hypertension: Secondary | ICD-10-CM

## 2016-09-25 DIAGNOSIS — K219 Gastro-esophageal reflux disease without esophagitis: Secondary | ICD-10-CM | POA: Diagnosis not present

## 2016-09-25 DIAGNOSIS — E7800 Pure hypercholesterolemia, unspecified: Secondary | ICD-10-CM | POA: Diagnosis not present

## 2016-09-25 DIAGNOSIS — D649 Anemia, unspecified: Secondary | ICD-10-CM

## 2016-09-25 NOTE — Progress Notes (Signed)
Pre visit review using our clinic review tool, if applicable. No additional management support is needed unless otherwise documented below in the visit note. 

## 2016-09-25 NOTE — Assessment & Plan Note (Signed)
Was followed by Dr Lonna CobbStoioff.  S/p biopsy - ok.  PSA as outlined.  Stable.

## 2016-09-25 NOTE — Progress Notes (Signed)
Patient ID: Aaron R Antonio Jr., male   DOB: 05/05/1966, 50 y.o.   MRN: [id number]   Subjective:    Patient ID: Aaron R Outen Jr., male    DOB: 06/11/1966, 50 y.o.   MRN: [id number]  HPI  Patient here for a scheduled follow up.   He reports he is doing well.  Feels good.  Stays physically active.  No chest pain.  No sob.  No acid reflux.  No abdominal pain or cramping.  Bowels stable.  Not following sugars.  Discussed diet and exercise.     Past Medical History:  Diagnosis Date  . GERD (gastroesophageal reflux disease)   . Hypercholesterolemia   . Hyperglycemia   . Hypertension   . Iron deficiency anemia    Past Surgical History:  Procedure Laterality Date  . INGUINAL HERNIA REPAIR     Family History  Problem Relation Age of Onset  . Hypertension Mother   . Hypertension Father   . CVA Paternal Grandfather   . Colon cancer Neg Hx   . Prostate cancer Neg Hx    Social History   Social History  . Marital status: Married    Spouse name: N/A  . Number of children: 2  . Years of education: N/A   Social History Main Topics  . Smoking status: Never Smoker  . Smokeless tobacco: Never Used  . Alcohol use No  . Drug use: No  . Sexual activity: Not Asked   Other Topics Concern  . None   Social History Narrative  . None    Outpatient Encounter Prescriptions as of 09/25/2016  Medication Sig  . aspirin 81 MG tablet Take 81 mg by mouth daily.  . atorvastatin (LIPITOR) 40 MG tablet TAKE 1 TABLET DAILY  . ferrous sulfate 325 (65 FE) MG tablet Take 325 mg by mouth daily.  . Lancets (ACCU-CHEK SOFT TOUCH) lancets Use as instructed  . losartan-hydrochlorothiazide (HYZAAR) 100-12.5 MG tablet TAKE 1 TABLET DAILY  . metoprolol succinate (TOPROL-XL) 25 MG 24 hr tablet Take 1 tablet (25 mg total) by mouth daily.  . omeprazole (PRILOSEC) 40 MG capsule TAKE 1 CAPSULE DAILY  . [DISCONTINUED] metFORMIN (GLUCOPHAGE) 500 MG tablet Take 1 tablet (500 mg total) by mouth daily.   No  facility-administered encounter medications on file as of 09/25/2016.     Review of Systems  Constitutional: Negative for appetite change and unexpected weight change.  HENT: Negative for congestion and sinus pressure.   Respiratory: Negative for cough, chest tightness and shortness of breath.   Cardiovascular: Negative for chest pain, palpitations and leg swelling.  Gastrointestinal: Negative for abdominal pain, diarrhea, nausea and vomiting.  Genitourinary: Negative for difficulty urinating and dysuria.  Musculoskeletal: Negative for back pain and joint swelling.  Skin: Negative for color change and rash.  Neurological: Negative for dizziness, light-headedness and headaches.  Psychiatric/Behavioral: Negative for agitation and dysphoric mood.       Objective:     Blood pressure rechecked by me:  118/80  Physical Exam  Constitutional: He appears well-developed and well-nourished. No distress.  HENT:  Nose: Nose normal.  Mouth/Throat: Oropharynx is clear and moist.  Neck: Neck supple. No thyromegaly present.  Cardiovascular: Normal rate and regular rhythm.   Pulmonary/Chest: Effort normal and breath sounds normal. No respiratory distress.  Abdominal: Soft. Bowel sounds are normal. There is no tenderness.  Musculoskeletal: He exhibits no edema or tenderness.  Lymphadenopathy:    He has no cervical adenopathy.  Skin: No rash noted. No   erythema.  Psychiatric: He has a normal mood and affect. His behavior is normal.    BP 108/66   Pulse 70   Temp 98.6 F (37 C) (Oral)   Ht 5' 10" (1.778 m)   Wt 239 lb 3.2 oz (108.5 kg)   SpO2 97%   BMI 34.32 kg/m  Wt Readings from Last 3 Encounters:  09/25/16 239 lb 3.2 oz (108.5 kg)  05/21/16 232 lb (105.2 kg)  01/15/16 235 lb 4 oz (106.7 kg)     Lab Results  Component Value Date   WBC 8.6 02/27/2016   HGB 13.1 02/27/2016   HCT 38.4 (L) 02/27/2016   PLT 330.0 02/27/2016   GLUCOSE 110 (H) 07/21/2016   CHOL 135 07/21/2016   TRIG  101.0 07/21/2016   HDL 35.50 (L) 07/21/2016   LDLCALC 80 07/21/2016   ALT 16 07/21/2016   AST 14 07/21/2016   NA 140 07/21/2016   K 3.5 07/21/2016   CL 104 07/21/2016   CREATININE 1.06 07/21/2016   BUN 15 07/21/2016   CO2 31 07/21/2016   TSH 2.08 12/31/2015   PSA 1.38 12/31/2015   HGBA1C 6.5 07/21/2016   MICROALBUR 1.5 12/31/2015    Dg Ankle Complete Left  Result Date: 03/13/2015 CLINICAL DATA:  48-year-old with left ankle pain and swelling status post twisting injury while golfing. Lateral side symptoms. Initial encounter. EXAM: LEFT ANKLE COMPLETE - 3+ VIEW COMPARISON:  None. FINDINGS: There is a chronic appearing posterior tibia exostosis which might be sequelae of remote trauma. Mortise joint alignment is preserved. Talar dome intact. No definite joint effusion. Calcaneus intact with degenerative spurring. Left fibula appears intact. No acute fracture or dislocation identified. IMPRESSION: 1. No acute fracture or dislocation identified about the left ankle. 2. 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 Electronically Signed   By: H  Hall M.D.   On: 03/13/2015 08:48       Assessment & Plan:   Problem List Items Addressed This Visit    Anemia    hgb 13.1 on last check.  Follow.       Relevant Orders   CBC with Differential/Platelet   Ferritin   Diabetes mellitus (HCC)    Last a1c improved 6.5.  Off metformin.  Low carb diet and exercise.  Follow met b and a1c.        Relevant Orders   Hemoglobin A1c   Enlarged prostate    Was followed by Dr Stoioff.  S/p biopsy - ok.  PSA as outlined.  Stable.        Relevant Orders   PSA   GERD (gastroesophageal reflux disease)    On omeprazole.  No upper symptoms.        Hypercholesterolemia    On lipitor.  Low cholesterol diet and exercise.  Follow lipid panel and liver function tests.        Relevant Orders   Hepatic function panel   Lipid panel   Hypertension    Blood pressure  under good control.  Continue same medication regimen.  Follow pressures.  Follow metabolic panel.        Relevant Orders   Basic metabolic panel       SCOTT, CHARLENE, MD  

## 2016-09-25 NOTE — Assessment & Plan Note (Signed)
Blood pressure under good control.  Continue same medication regimen.  Follow pressures.  Follow metabolic panel.   

## 2016-09-25 NOTE — Assessment & Plan Note (Signed)
On lipitor.  Low cholesterol diet and exercise.  Follow lipid panel and liver function tests.   

## 2016-09-25 NOTE — Assessment & Plan Note (Signed)
hgb 13.1 on last check.  Follow.

## 2016-09-25 NOTE — Assessment & Plan Note (Signed)
On omeprazole.  No upper symptoms.   

## 2016-09-25 NOTE — Assessment & Plan Note (Signed)
Last a1c improved 6.5.  Off metformin.  Low carb diet and exercise.  Follow met b and a1c.

## 2016-11-17 ENCOUNTER — Other Ambulatory Visit (INDEPENDENT_AMBULATORY_CARE_PROVIDER_SITE_OTHER): Payer: BLUE CROSS/BLUE SHIELD

## 2016-11-17 DIAGNOSIS — D649 Anemia, unspecified: Secondary | ICD-10-CM

## 2016-11-17 DIAGNOSIS — E119 Type 2 diabetes mellitus without complications: Secondary | ICD-10-CM

## 2016-11-17 DIAGNOSIS — E7800 Pure hypercholesterolemia, unspecified: Secondary | ICD-10-CM

## 2016-11-17 DIAGNOSIS — I10 Essential (primary) hypertension: Secondary | ICD-10-CM | POA: Diagnosis not present

## 2016-11-17 DIAGNOSIS — N400 Enlarged prostate without lower urinary tract symptoms: Secondary | ICD-10-CM

## 2016-11-17 LAB — BASIC METABOLIC PANEL
BUN: 16 mg/dL (ref 6–23)
CO2: 30 meq/L (ref 19–32)
CREATININE: 1 mg/dL (ref 0.40–1.50)
Calcium: 9.4 mg/dL (ref 8.4–10.5)
Chloride: 103 mEq/L (ref 96–112)
GFR: 83.55 mL/min (ref >60.00)
GLUCOSE: 133 mg/dL — AB (ref 70–99)
Potassium: 4.2 mEq/L (ref 3.5–5.1)
SODIUM: 140 meq/L (ref 135–145)

## 2016-11-17 LAB — CBC WITH DIFFERENTIAL/PLATELET
BASOS ABS: 0 10*3/uL (ref 0.0–0.1)
Basophils Relative: 0.3 % (ref 0.0–3.0)
EOS ABS: 0.2 10*3/uL (ref 0.0–0.7)
Eosinophils Relative: 2 % (ref 0.0–5.0)
HEMATOCRIT: 42.3 % (ref 39.0–52.0)
HEMOGLOBIN: 14.6 g/dL (ref 13.0–17.0)
LYMPHS PCT: 30.7 % (ref 12.0–46.0)
Lymphs Abs: 2.3 10*3/uL (ref 0.7–4.0)
MCHC: 34.5 g/dL (ref 30.0–36.0)
MCV: 87.4 fl (ref 78.0–100.0)
MONO ABS: 0.5 10*3/uL (ref 0.1–1.0)
Monocytes Relative: 6.9 % (ref 3.0–12.0)
NEUTROS ABS: 4.6 10*3/uL (ref 1.4–7.7)
Neutrophils Relative %: 60.1 % (ref 43.0–77.0)
PLATELETS: 309 10*3/uL (ref 150.0–400.0)
RBC: 4.83 Mil/uL (ref 4.22–5.81)
RDW: 13.8 % (ref 11.5–15.5)
WBC: 7.6 10*3/uL (ref 4.0–10.5)

## 2016-11-17 LAB — HEPATIC FUNCTION PANEL
ALBUMIN: 4 g/dL (ref 3.5–5.2)
ALT: 23 U/L (ref 0–53)
AST: 16 U/L (ref 0–37)
Alkaline Phosphatase: 65 U/L (ref 39–117)
BILIRUBIN TOTAL: 0.5 mg/dL (ref 0.2–1.2)
Bilirubin, Direct: 0.1 mg/dL (ref 0.0–0.3)
Total Protein: 6.8 g/dL (ref 6.0–8.3)

## 2016-11-17 LAB — FERRITIN: FERRITIN: 136.6 ng/mL (ref 22.0–322.0)

## 2016-11-17 LAB — LIPID PANEL
CHOLESTEROL: 163 mg/dL (ref 0–200)
HDL: 45.9 mg/dL (ref >39.00)
LDL CALC: 97 mg/dL (ref 0–99)
NonHDL: 117.06
TRIGLYCERIDES: 99 mg/dL (ref 0.0–149.0)
Total CHOL/HDL Ratio: 4
VLDL: 19.8 mg/dL (ref 0.0–40.0)

## 2016-11-17 LAB — PSA: PSA: 1.71 ng/mL (ref 0.10–4.00)

## 2016-11-17 LAB — HEMOGLOBIN A1C: HEMOGLOBIN A1C: 7.1 % — AB (ref 4.6–6.5)

## 2016-12-18 ENCOUNTER — Ambulatory Visit (INDEPENDENT_AMBULATORY_CARE_PROVIDER_SITE_OTHER): Payer: BLUE CROSS/BLUE SHIELD | Admitting: Internal Medicine

## 2016-12-18 ENCOUNTER — Encounter: Payer: Self-pay | Admitting: Internal Medicine

## 2016-12-18 DIAGNOSIS — E7800 Pure hypercholesterolemia, unspecified: Secondary | ICD-10-CM

## 2016-12-18 DIAGNOSIS — I10 Essential (primary) hypertension: Secondary | ICD-10-CM | POA: Diagnosis not present

## 2016-12-18 DIAGNOSIS — E119 Type 2 diabetes mellitus without complications: Secondary | ICD-10-CM | POA: Diagnosis not present

## 2016-12-18 MED ORDER — METFORMIN HCL 500 MG PO TABS: 500.0000 mg | ORAL_TABLET | Freq: Every day | ORAL | 1 refills | Status: DC

## 2016-12-18 NOTE — Progress Notes (Signed)
Patient ID: Aaron Deer., male   DOB: 1965-12-18, 51 y.o.   MRN: [id number]   Subjective:    Patient ID: Aaron Deer., male    DOB: 03/08/1966, 51 y.o.   MRN: [id number]  HPI  Patient here to discuss recent elevated blood sugar.  Also noted to have elevated blood pressure on a work screening.  States he is doing well.  Since finding out lab results, he has cut out soft drinks and is adjusting his diet.  AM sugars averaging 120-150 and PM sugars 140-170 (occasional 200).  Recent a1c 7.1.  Discussed lab results.  Discussed diet and exercise.  Discussed treatment options.  He is in agreement to start metformin.  Was previously on for a short period.  No allergic reaction.  Overall feels good.  No chest pain.  No sob.     Past Medical History:  Diagnosis Date  . GERD (gastroesophageal reflux disease)   . Hypercholesterolemia   . Hyperglycemia   . Hypertension   . Iron deficiency anemia    Past Surgical History:  Procedure Laterality Date  . INGUINAL HERNIA REPAIR     Family History  Problem Relation Age of Onset  . Hypertension Mother   . Hypertension Father   . CVA Paternal Grandfather   . Colon cancer Neg Hx   . Prostate cancer Neg Hx    Social History   Social History  . Marital status: Married    Spouse name: N/A  . Number of children: 2  . Years of education: N/A   Social History Main Topics  . Smoking status: Never Smoker  . Smokeless tobacco: Never Used  . Alcohol use No  . Drug use: No  . Sexual activity: Not Asked   Other Topics Concern  . None   Social History Narrative  . None    Outpatient Encounter Prescriptions as of 12/18/2016  Medication Sig  . aspirin 81 MG tablet Take 81 mg by mouth daily.  Marland Kitchen atorvastatin (LIPITOR) 40 MG tablet TAKE 1 TABLET DAILY  . ferrous sulfate 325 (65 FE) MG tablet Take 325 mg by mouth daily.  . Lancets (ACCU-CHEK SOFT TOUCH) lancets Use as instructed  . losartan-hydrochlorothiazide (HYZAAR) 100-12.5 MG tablet TAKE 1  TABLET DAILY  . metoprolol succinate (TOPROL-XL) 25 MG 24 hr tablet Take 1 tablet (25 mg total) by mouth daily.  Marland Kitchen omeprazole (PRILOSEC) 40 MG capsule TAKE 1 CAPSULE DAILY  . metFORMIN (GLUCOPHAGE) 500 MG tablet Take 1 tablet (500 mg total) by mouth daily.   No facility-administered encounter medications on file as of 12/18/2016.     Review of Systems  Constitutional: Negative for appetite change and unexpected weight change.  HENT: Negative for congestion and sinus pressure.   Respiratory: Negative for cough, chest tightness and shortness of breath.   Cardiovascular: Negative for chest pain, palpitations and leg swelling.  Gastrointestinal: Negative for abdominal pain, diarrhea, nausea and vomiting.  Genitourinary: Negative for difficulty urinating and dysuria.  Musculoskeletal: Negative for back pain and joint swelling.  Skin: Negative for color change and rash.  Neurological: Negative for dizziness, light-headedness and headaches.  Psychiatric/Behavioral: Negative for agitation and dysphoric mood.       Objective:    Physical Exam  Constitutional: He appears well-developed and well-nourished. No distress.  HENT:  Nose: Nose normal.  Mouth/Throat: Oropharynx is clear and moist.  Neck: Neck supple. No thyromegaly present.  Cardiovascular: Normal rate and regular rhythm.   Pulmonary/Chest: Effort normal and  breath sounds normal. No respiratory distress.  Abdominal: Soft. Bowel sounds are normal. There is no tenderness.  Musculoskeletal: He exhibits no edema or tenderness.  Lymphadenopathy:    He has no cervical adenopathy.  Skin: No rash noted. No erythema.  Psychiatric: He has a normal mood and affect. His behavior is normal.    BP 128/76 (BP Location: Left Arm, Patient Position: Sitting, Cuff Size: Large)   Pulse 81   Temp 99.1 F (37.3 C) (Oral)   Resp 16   Wt 241 lb 2 oz (109.4 kg)   SpO2 95%   BMI 34.60 kg/m  Wt Readings from Last 3 Encounters:  12/18/16 241 lb 2  oz (109.4 kg)  09/25/16 239 lb 3.2 oz (108.5 kg)  05/21/16 232 lb (105.2 kg)     Lab Results  Component Value Date   WBC 7.6 11/17/2016   HGB 14.6 11/17/2016   HCT 42.3 11/17/2016   PLT 309.0 11/17/2016   GLUCOSE 133 (H) 11/17/2016   CHOL 163 11/17/2016   TRIG 99.0 11/17/2016   HDL 45.90 11/17/2016   LDLCALC 97 11/17/2016   ALT 23 11/17/2016   AST 16 11/17/2016   NA 140 11/17/2016   K 4.2 11/17/2016   CL 103 11/17/2016   CREATININE 1.00 11/17/2016   BUN 16 11/17/2016   CO2 30 11/17/2016   TSH 2.08 12/31/2015   PSA 1.71 11/17/2016   HGBA1C 7.1 (H) 11/17/2016   MICROALBUR 1.5 12/31/2015       Assessment & Plan:   Problem List Items Addressed This Visit    Diabetes mellitus (HCC)    Low carb diet and exercise.  Discussed recent elevation.  a1c 7.1.  Start metformin.  He has already started adjusting his diet.  Exercise.  Follow.        Relevant Medications   metFORMIN (GLUCOPHAGE) 500 MG tablet   Hypercholesterolemia    On lipitor.  Low cholesterol diet and exercise.  Follow lipid panel and liver function tests.        Hypertension    Blood pressure under good control.  Continue same medication regimen.  Follow pressures.  Follow metabolic panel.            Aaron Allison, Aaron Loiseau, MD

## 2016-12-18 NOTE — Progress Notes (Signed)
Pre-visit discussion using our clinic review tool. No additional management support is needed unless otherwise documented below in the visit note.  

## 2016-12-20 ENCOUNTER — Encounter: Payer: Self-pay | Admitting: Internal Medicine

## 2016-12-20 NOTE — Assessment & Plan Note (Signed)
Low carb diet and exercise.  Discussed recent elevation.  a1c 7.1.  Start metformin.  He has already started adjusting his diet.  Exercise.  Follow.

## 2016-12-20 NOTE — Assessment & Plan Note (Signed)
On lipitor.  Low cholesterol diet and exercise.  Follow lipid panel and liver function tests.   

## 2016-12-20 NOTE — Assessment & Plan Note (Signed)
Blood pressure under good control.  Continue same medication regimen.  Follow pressures.  Follow metabolic panel.   

## 2017-01-22 ENCOUNTER — Encounter: Payer: BLUE CROSS/BLUE SHIELD | Admitting: Internal Medicine

## 2017-02-06 ENCOUNTER — Encounter: Payer: Self-pay | Admitting: Internal Medicine

## 2017-02-06 ENCOUNTER — Ambulatory Visit (INDEPENDENT_AMBULATORY_CARE_PROVIDER_SITE_OTHER): Payer: BLUE CROSS/BLUE SHIELD | Admitting: Internal Medicine

## 2017-02-06 VITALS — BP 124/74 | HR 85 | Temp 98.6°F | Resp 12 | Ht 70.08 in | Wt 239.8 lb

## 2017-02-06 DIAGNOSIS — N400 Enlarged prostate without lower urinary tract symptoms: Secondary | ICD-10-CM | POA: Diagnosis not present

## 2017-02-06 DIAGNOSIS — I10 Essential (primary) hypertension: Secondary | ICD-10-CM | POA: Diagnosis not present

## 2017-02-06 DIAGNOSIS — Z1211 Encounter for screening for malignant neoplasm of colon: Secondary | ICD-10-CM | POA: Diagnosis not present

## 2017-02-06 DIAGNOSIS — K219 Gastro-esophageal reflux disease without esophagitis: Secondary | ICD-10-CM

## 2017-02-06 DIAGNOSIS — E119 Type 2 diabetes mellitus without complications: Secondary | ICD-10-CM

## 2017-02-06 DIAGNOSIS — E7800 Pure hypercholesterolemia, unspecified: Secondary | ICD-10-CM

## 2017-02-06 DIAGNOSIS — Z0000 Encounter for general adult medical examination without abnormal findings: Secondary | ICD-10-CM

## 2017-02-06 MED ORDER — MELOXICAM 7.5 MG PO TABS: 7.5000 mg | ORAL_TABLET | Freq: Every day | ORAL | 0 refills | Status: DC

## 2017-02-06 NOTE — Progress Notes (Signed)
Pre-visit discussion using our clinic review tool. No additional management support is needed unless otherwise documented below in the visit note.  

## 2017-02-06 NOTE — Progress Notes (Signed)
Patient ID: [name]., male   DOB: 10-07-1965, 51 y.o.   MRN: [id number]   Subjective:    Patient ID: [name]., male    DOB: 03/08/1966, 51 y.o.   MRN: [id number]  HPI  Patient here for his physical exam.  States he is doing well.  Will have rare intermittent flares with his back.  Had checked out previously.  Takes prn meloxicam.  Rarely takes.  Has not had rx since 2012.  No pain radiating down leg when flares.  No back pain now.  May only flare one time per year.  No chest pain.  No sob.  No acid reflux.  No abdominal pain.  Bowels moving.  Has been watching his diet better.  Discussed low carb diet.  Was exercising more.  Plans to restart.  Sugars reviewed and attached.  AM sugars averaging 130-140s and pm sugars averaging 160-190.  Taking metformin and tolerating.     Past Medical History:  Diagnosis Date  . GERD (gastroesophageal reflux disease)   . Hypercholesterolemia   . Hyperglycemia   . Hypertension   . Iron deficiency anemia    Past Surgical History:  Procedure Laterality Date  . INGUINAL HERNIA REPAIR     Family History  Problem Relation Age of Onset  . Hypertension Mother   . Hypertension Father   . CVA Paternal Grandfather   . Colon cancer Neg Hx   . Prostate cancer Neg Hx    Social History   Social History  . Marital status: Married    Spouse name: N/A  . Number of children: 2  . Years of education: N/A   Social History Main Topics  . Smoking status: Never Smoker  . Smokeless tobacco: Never Used  . Alcohol use No  . Drug use: No  . Sexual activity: Not Asked   Other Topics Concern  . None   Social History Narrative  . None    Outpatient Encounter Prescriptions as of 02/06/2017  Medication Sig  . aspirin 81 MG tablet Take 81 mg by mouth daily.  Marland Kitchen atorvastatin (LIPITOR) 40 MG tablet TAKE 1 TABLET DAILY  . ferrous sulfate 325 (65 FE) MG tablet Take 325 mg by mouth daily.  . Lancets (ACCU-CHEK SOFT TOUCH) lancets Use as instructed  .  losartan-hydrochlorothiazide (HYZAAR) 100-12.5 MG tablet TAKE 1 TABLET DAILY  . metFORMIN (GLUCOPHAGE) 500 MG tablet Take 1 tablet (500 mg total) by mouth daily.  . metoprolol succinate (TOPROL-XL) 25 MG 24 hr tablet Take 1 tablet (25 mg total) by mouth daily.  Marland Kitchen omeprazole (PRILOSEC) 40 MG capsule TAKE 1 CAPSULE DAILY  . meloxicam (MOBIC) 7.5 MG tablet Take 1 tablet (7.5 mg total) by mouth daily.   No facility-administered encounter medications on file as of 02/06/2017.     Review of Systems  Constitutional: Negative for appetite change and unexpected weight change.  HENT: Negative for congestion and sinus pressure.   Eyes: Negative for pain and visual disturbance.  Respiratory: Negative for cough, chest tightness and shortness of breath.   Cardiovascular: Negative for chest pain, palpitations and leg swelling.  Gastrointestinal: Negative for abdominal pain, diarrhea, nausea and vomiting.  Genitourinary: Negative for difficulty urinating and dysuria.  Musculoskeletal: Negative for back pain and joint swelling.  Skin: Negative for color change and rash.  Neurological: Negative for dizziness, light-headedness and headaches.  Hematological: Negative for adenopathy. Does not bruise/bleed easily.  Psychiatric/Behavioral: Negative for agitation and dysphoric mood.  Objective:    Physical Exam  Constitutional: He is oriented to person, place, and time. He appears well-developed and well-nourished. No distress.  HENT:  Head: Normocephalic and atraumatic.  Nose: Nose normal.  Mouth/Throat: Oropharynx is clear and moist. No oropharyngeal exudate.  Eyes: Conjunctivae are normal. Right eye exhibits no discharge. Left eye exhibits no discharge.  Neck: Neck supple. No thyromegaly present.  Cardiovascular: Normal rate and regular rhythm.   Pulmonary/Chest: Breath sounds normal. No respiratory distress. He has no wheezes.  Abdominal: Soft. Bowel sounds are normal. There is no tenderness.    Genitourinary:  Genitourinary Comments: Rectal exam - no palpable prostate nodules.  Heme negative.    Musculoskeletal: He exhibits no edema or tenderness.  Lymphadenopathy:    He has no cervical adenopathy.  Neurological: He is alert and oriented to person, place, and time.  Skin: Skin is warm and dry. No rash noted. No erythema.  Psychiatric: He has a normal mood and affect. His behavior is normal.    BP 124/74 (BP Location: Left Arm, Patient Position: Sitting, Cuff Size: Large)   Pulse 85   Temp 98.6 F (37 C) (Oral)   Resp 12   Ht 5' 10.08" (1.78 m)   Wt 239 lb 12.8 oz (108.8 kg)   SpO2 97%   BMI 34.33 kg/m  Wt Readings from Last 3 Encounters:  02/06/17 239 lb 12.8 oz (108.8 kg)  12/18/16 241 lb 2 oz (109.4 kg)  09/25/16 239 lb 3.2 oz (108.5 kg)     Lab Results  Component Value Date   WBC 7.6 11/17/2016   HGB 14.6 11/17/2016   HCT 42.3 11/17/2016   PLT 309.0 11/17/2016   GLUCOSE 133 (H) 11/17/2016   CHOL 163 11/17/2016   TRIG 99.0 11/17/2016   HDL 45.90 11/17/2016   LDLCALC 97 11/17/2016   ALT 23 11/17/2016   AST 16 11/17/2016   NA 140 11/17/2016   K 4.2 11/17/2016   CL 103 11/17/2016   CREATININE 1.00 11/17/2016   BUN 16 11/17/2016   CO2 30 11/17/2016   TSH 2.08 12/31/2015   PSA 1.71 11/17/2016   HGBA1C 7.1 (H) 11/17/2016   MICROALBUR 1.5 12/31/2015       Assessment & Plan:   Problem List Items Addressed This Visit    Diabetes mellitus ([place])    Low carb diet and exercise.  Watching his diet better.  Plans to start exercising more.  On metformin.  Sugars as outlined.  Follow met b and a1c.  Hold on additional medication.  Plans to get eyes checked soon.        Relevant Orders   Hemoglobin A1c   Microalbumin / creatinine urine ratio   Enlarged prostate    S/p biopsy - ok.  Was followed by Dr Bernardo Heater.  Check psa.        GERD (gastroesophageal reflux disease)    Controlled on omeprazole.        Health care maintenance    Physical today  02/06/17.  Colonoscopy 2007.  Refer back to GI - for screening colonoscopy.  Check psa.       Hypercholesterolemia    On lipitor.  Low cholesterol diet and exercise.  Follow lipid panel and liver function tests.        Relevant Orders   Hepatic function panel   Lipid panel   Hypertension    Blood pressure under good control.  Continue same medication regimen.  Follow pressures.  Follow metabolic panel.  Relevant Orders   TSH   Basic metabolic panel    Other Visit Diagnoses    Routine general medical examination at a health care facility    -  Primary   Colon cancer screening       Relevant Orders   Ambulatory referral to Gastroenterology       Einar Pheasant, MD

## 2017-02-06 NOTE — Assessment & Plan Note (Addendum)
Physical today 02/06/17.  Colonoscopy 2007.  Refer back to GI - for screening colonoscopy.  Check psa.

## 2017-02-07 ENCOUNTER — Encounter: Payer: Self-pay | Admitting: Internal Medicine

## 2017-02-07 NOTE — Assessment & Plan Note (Signed)
Blood pressure under good control.  Continue same medication regimen.  Follow pressures.  Follow metabolic panel.   

## 2017-02-07 NOTE — Assessment & Plan Note (Signed)
On lipitor.  Low cholesterol diet and exercise.  Follow lipid panel and liver function tests.   

## 2017-02-07 NOTE — Assessment & Plan Note (Signed)
Low carb diet and exercise.  Watching his diet better.  Plans to start exercising more.  On metformin.  Sugars as outlined.  Follow met b and a1c.  Hold on additional medication.  Plans to get eyes checked soon.   

## 2017-02-07 NOTE — Assessment & Plan Note (Signed)
Controlled on omeprazole.   

## 2017-02-07 NOTE — Assessment & Plan Note (Signed)
S/p biopsy - ok.  Was followed by Dr Lonna CobbStoioff.  Check psa.

## 2017-02-09 ENCOUNTER — Other Ambulatory Visit: Payer: Self-pay | Admitting: Internal Medicine

## 2017-02-16 ENCOUNTER — Other Ambulatory Visit: Payer: Self-pay | Admitting: Internal Medicine

## 2017-03-02 ENCOUNTER — Other Ambulatory Visit: Payer: Self-pay

## 2017-03-02 MED ORDER — METOPROLOL SUCCINATE ER 25 MG PO TB24: 25.0000 mg | ORAL_TABLET | Freq: Every day | ORAL | 3 refills | Status: DC

## 2017-03-06 ENCOUNTER — Other Ambulatory Visit: Payer: Self-pay

## 2017-03-06 MED ORDER — METOPROLOL SUCCINATE ER 25 MG PO TB24: 25.0000 mg | ORAL_TABLET | Freq: Every day | ORAL | 1 refills | Status: DC

## 2017-03-07 ENCOUNTER — Other Ambulatory Visit: Payer: Self-pay | Admitting: Internal Medicine

## 2017-04-16 ENCOUNTER — Other Ambulatory Visit: Payer: Self-pay

## 2017-04-16 MED ORDER — METFORMIN HCL 500 MG PO TABS: 500.0000 mg | ORAL_TABLET | Freq: Every day | ORAL | 1 refills | Status: DC

## 2017-05-13 DIAGNOSIS — Z1211 Encounter for screening for malignant neoplasm of colon: Secondary | ICD-10-CM | POA: Diagnosis not present

## 2017-05-18 ENCOUNTER — Other Ambulatory Visit (INDEPENDENT_AMBULATORY_CARE_PROVIDER_SITE_OTHER): Payer: BLUE CROSS/BLUE SHIELD

## 2017-05-18 DIAGNOSIS — E119 Type 2 diabetes mellitus without complications: Secondary | ICD-10-CM

## 2017-05-18 DIAGNOSIS — E7800 Pure hypercholesterolemia, unspecified: Secondary | ICD-10-CM | POA: Diagnosis not present

## 2017-05-18 DIAGNOSIS — I10 Essential (primary) hypertension: Secondary | ICD-10-CM | POA: Diagnosis not present

## 2017-05-18 LAB — BASIC METABOLIC PANEL
BUN: 15 mg/dL (ref 6–23)
CHLORIDE: 102 meq/L (ref 96–112)
CO2: 28 meq/L (ref 19–32)
CREATININE: 1.02 mg/dL (ref 0.40–1.50)
Calcium: 9 mg/dL (ref 8.4–10.5)
GFR: 81.5 mL/min (ref >60.00)
GLUCOSE: 127 mg/dL — AB (ref 70–99)
Potassium: 3.6 mEq/L (ref 3.5–5.1)
Sodium: 138 mEq/L (ref 135–145)

## 2017-05-18 LAB — LIPID PANEL
CHOLESTEROL: 123 mg/dL (ref 0–200)
HDL: 37.4 mg/dL — AB (ref >39.00)
LDL CALC: 58 mg/dL (ref 0–99)
NonHDL: 85.17
Total CHOL/HDL Ratio: 3
Triglycerides: 136 mg/dL (ref 0.0–149.0)
VLDL: 27.2 mg/dL (ref 0.0–40.0)

## 2017-05-18 LAB — HEPATIC FUNCTION PANEL
ALBUMIN: 3.7 g/dL (ref 3.5–5.2)
ALT: 19 U/L (ref 0–53)
AST: 18 U/L (ref 0–37)
Alkaline Phosphatase: 69 U/L (ref 39–117)
BILIRUBIN TOTAL: 0.8 mg/dL (ref 0.2–1.2)
Bilirubin, Direct: 0.2 mg/dL (ref 0.0–0.3)
Total Protein: 7.2 g/dL (ref 6.0–8.3)

## 2017-05-18 LAB — HEMOGLOBIN A1C: HEMOGLOBIN A1C: 6.8 % — AB (ref 4.6–6.5)

## 2017-05-18 LAB — MICROALBUMIN / CREATININE URINE RATIO
Creatinine,U: 309.8 mg/dL
Microalb Creat Ratio: 0.5 mg/g (ref 0.0–30.0)
Microalb, Ur: 1.6 mg/dL (ref 0.0–1.9)

## 2017-05-18 LAB — TSH: TSH: 1.94 u[IU]/mL (ref 0.35–4.50)

## 2017-05-20 ENCOUNTER — Encounter: Payer: Self-pay | Admitting: Internal Medicine

## 2017-05-20 ENCOUNTER — Ambulatory Visit (INDEPENDENT_AMBULATORY_CARE_PROVIDER_SITE_OTHER): Payer: BLUE CROSS/BLUE SHIELD | Admitting: Internal Medicine

## 2017-05-20 DIAGNOSIS — N400 Enlarged prostate without lower urinary tract symptoms: Secondary | ICD-10-CM

## 2017-05-20 DIAGNOSIS — K219 Gastro-esophageal reflux disease without esophagitis: Secondary | ICD-10-CM

## 2017-05-20 DIAGNOSIS — D649 Anemia, unspecified: Secondary | ICD-10-CM

## 2017-05-20 DIAGNOSIS — E119 Type 2 diabetes mellitus without complications: Secondary | ICD-10-CM | POA: Diagnosis not present

## 2017-05-20 DIAGNOSIS — E7800 Pure hypercholesterolemia, unspecified: Secondary | ICD-10-CM | POA: Diagnosis not present

## 2017-05-20 DIAGNOSIS — I10 Essential (primary) hypertension: Secondary | ICD-10-CM | POA: Diagnosis not present

## 2017-05-20 LAB — HM DIABETES FOOT EXAM

## 2017-05-20 MED ORDER — METFORMIN HCL 500 MG PO TABS: 500.0000 mg | ORAL_TABLET | Freq: Two times a day (BID) | ORAL | 1 refills | Status: DC

## 2017-05-20 NOTE — Progress Notes (Signed)
Patient ID: Aaron Deer., male   DOB: 1966-05-22, 51 y.o.   MRN: [id number]   Subjective:    Patient ID: Aaron Deer., male    DOB: 08-28-1966, 51 y.o.   MRN: [id number]  HPI  Patient here for a scheduled follow up.  States he is doing well.  Feels good.  Stays active.  Has not been exercising as much.  Plans to start walking more.  No chest pain.  No sob.  No acid reflux.  No abdominal pain.  Bowels moving.  Sugars averaging 140-160.  Discussed labs.  a1c improved 6.8.  Still elevated in the am.  Discussed increasing his medication to bid.  He is agreeable. States his blood pressures are averaging 120s/70s.     Past Medical History:  Diagnosis Date  . GERD (gastroesophageal reflux disease)   . Hypercholesterolemia   . Hyperglycemia   . Hypertension   . Iron deficiency anemia    Past Surgical History:  Procedure Laterality Date  . INGUINAL HERNIA REPAIR     Family History  Problem Relation Age of Onset  . Hypertension Mother   . Hypertension Father   . CVA Paternal Grandfather   . Colon cancer Neg Hx   . Prostate cancer Neg Hx    Social History   Social History  . Marital status: Married    Spouse name: N/A  . Number of children: 2  . Years of education: N/A   Social History Main Topics  . Smoking status: Never Smoker  . Smokeless tobacco: Never Used  . Alcohol use No  . Drug use: No  . Sexual activity: Not Asked   Other Topics Concern  . None   Social History Narrative  . None    Outpatient Encounter Prescriptions as of 05/20/2017  Medication Sig  . aspirin 81 MG tablet Take 81 mg by mouth daily.  Marland Kitchen atorvastatin (LIPITOR) 40 MG tablet TAKE 1 TABLET DAILY  . ferrous sulfate 325 (65 FE) MG tablet Take 325 mg by mouth daily.   . Lancets (ACCU-CHEK SOFT TOUCH) lancets Use as instructed  . losartan-hydrochlorothiazide (HYZAAR) 100-12.5 MG tablet TAKE 1 TABLET DAILY  . meloxicam (MOBIC) 7.5 MG tablet Take 1 tablet (7.5 mg total) by mouth daily.  . metFORMIN  (GLUCOPHAGE) 500 MG tablet Take 1 tablet (500 mg total) by mouth 2 (two) times daily with a meal.  . metoprolol succinate (TOPROL-XL) 25 MG 24 hr tablet Take 1 tablet (25 mg total) by mouth daily.  Marland Kitchen omeprazole (PRILOSEC) 40 MG capsule TAKE 1 CAPSULE DAILY  . [DISCONTINUED] metFORMIN (GLUCOPHAGE) 500 MG tablet Take 1 tablet (500 mg total) by mouth daily.   No facility-administered encounter medications on file as of 05/20/2017.     Review of Systems  Constitutional: Negative for appetite change and unexpected weight change.  HENT: Negative for congestion and sinus pressure.   Respiratory: Negative for cough, chest tightness and shortness of breath.   Cardiovascular: Negative for chest pain, palpitations and leg swelling.  Gastrointestinal: Negative for abdominal pain, diarrhea, nausea and vomiting.  Genitourinary: Negative for difficulty urinating and dysuria.  Musculoskeletal: Negative for back pain and joint swelling.  Skin: Negative for color change and rash.  Neurological: Negative for dizziness, light-headedness and headaches.  Psychiatric/Behavioral: Negative for agitation and dysphoric mood.       Objective:    Physical Exam  Constitutional: He appears well-developed and well-nourished. No distress.  HENT:  Nose: Nose normal.  Mouth/Throat: Oropharynx is  clear and moist.  Neck: Neck supple. No thyromegaly present.  Cardiovascular: Normal rate and regular rhythm.   Pulmonary/Chest: Effort normal and breath sounds normal. No respiratory distress.  Abdominal: Soft. Bowel sounds are normal. There is no tenderness.  Musculoskeletal: He exhibits no edema or tenderness.  Feet:  No lesions.  DP pulses palpable and equal bilaterally.  Sensation intact to light touch and pin prick.   Lymphadenopathy:    He has no cervical adenopathy.  Skin: No rash noted. No erythema.  Psychiatric: He has a normal mood and affect. His behavior is normal.    BP 126/74 (BP Location: Left Arm,  Patient Position: Sitting, Cuff Size: Normal)   Pulse 74   Temp 98.6 F (37 C) (Oral)   Resp 12   Ht 5\' 10"  (1.778 m)   Wt 241 lb 9.6 oz (109.6 kg)   SpO2 96%   BMI 34.67 kg/m  Wt Readings from Last 3 Encounters:  05/20/17 241 lb 9.6 oz (109.6 kg)  02/06/17 239 lb 12.8 oz (108.8 kg)  12/18/16 241 lb 2 oz (109.4 kg)     Lab Results  Component Value Date   WBC 7.6 11/17/2016   HGB 14.6 11/17/2016   HCT 42.3 11/17/2016   PLT 309.0 11/17/2016   GLUCOSE 127 (H) 05/18/2017   CHOL 123 05/18/2017   TRIG 136.0 05/18/2017   HDL 37.40 (L) 05/18/2017   LDLCALC 58 05/18/2017   ALT 19 05/18/2017   AST 18 05/18/2017   NA 138 05/18/2017   K 3.6 05/18/2017   CL 102 05/18/2017   CREATININE 1.02 05/18/2017   BUN 15 05/18/2017   CO2 28 05/18/2017   TSH 1.94 05/18/2017   PSA 1.71 11/17/2016   HGBA1C 6.8 (H) 05/18/2017   MICROALBUR 1.6 05/18/2017       Assessment & Plan:   Problem List Items Addressed This Visit    Anemia    Last hgb wnl.  Recheck cbc and ferritin with next labs.        Relevant Orders   CBC with Differential/Platelet   Ferritin   Diabetes mellitus ([facility])    Discussed low carb diet and exercise.  Discussed weight loss.  a1c improved.  Will increase metformin to bid.  Follow sugars.  Discussed the need for eye exam.  He is planning to schedule.        Relevant Medications   metFORMIN (GLUCOPHAGE) 500 MG tablet   Other Relevant Orders   Hemoglobin A1c   Basic metabolic panel   Enlarged prostate    S/p biopsy - ok.  Was followed by Dr Lonna Cobb.  Recheck psa with next labs.        Relevant Orders   PSA   GERD (gastroesophageal reflux disease)    Controlled on omeprazole.        Hypercholesterolemia    On lipitor.  Low cholesterol diet and exercise.  Follow lipid panel and liver function tests.   Lab Results  Component Value Date   CHOL 123 05/18/2017   HDL 37.40 (L) 05/18/2017   LDLCALC 58 05/18/2017   TRIG 136.0 05/18/2017   CHOLHDL 3 05/18/2017          Relevant Orders   Hepatic function panel   Lipid panel   Hypertension    Blood pressure under good control.  Continue same medication regimen.  Follow pressures.  Follow metabolic panel.            [place], MD

## 2017-05-21 ENCOUNTER — Encounter: Payer: Self-pay | Admitting: Internal Medicine

## 2017-05-21 NOTE — Assessment & Plan Note (Signed)
S/p biopsy - ok.  Was followed by Dr Lonna Cobb.  Recheck psa with next labs.

## 2017-05-21 NOTE — Assessment & Plan Note (Signed)
Blood pressure under good control.  Continue same medication regimen.  Follow pressures.  Follow metabolic panel.   

## 2017-05-21 NOTE — Assessment & Plan Note (Signed)
Controlled on omeprazole.   

## 2017-05-21 NOTE — Assessment & Plan Note (Signed)
On lipitor.  Low cholesterol diet and exercise.  Follow lipid panel and liver function tests.   Lab Results  Component Value Date   CHOL 123 05/18/2017   HDL 37.40 (L) 05/18/2017   LDLCALC 58 05/18/2017   TRIG 136.0 05/18/2017   CHOLHDL 3 05/18/2017

## 2017-05-21 NOTE — Assessment & Plan Note (Signed)
Discussed low carb diet and exercise.  Discussed weight loss.  a1c improved.  Will increase metformin to bid.  Follow sugars.  Discussed the need for eye exam.  He is planning to schedule.

## 2017-05-21 NOTE — Assessment & Plan Note (Signed)
Last hgb wnl.  Recheck cbc and ferritin with next labs.

## 2017-05-28 IMAGING — DX DG ANKLE COMPLETE 3+V*L*
3 series · 3 of 3 positions shown · non-contrast
Comparison: None.

CLINICAL DATA: 48-year-old with left ankle pain and swelling status
post twisting injury while golfing. Lateral side symptoms. Initial
encounter.

EXAM:
LEFT ANKLE COMPLETE - 3+ VIEW

[ankle ap]
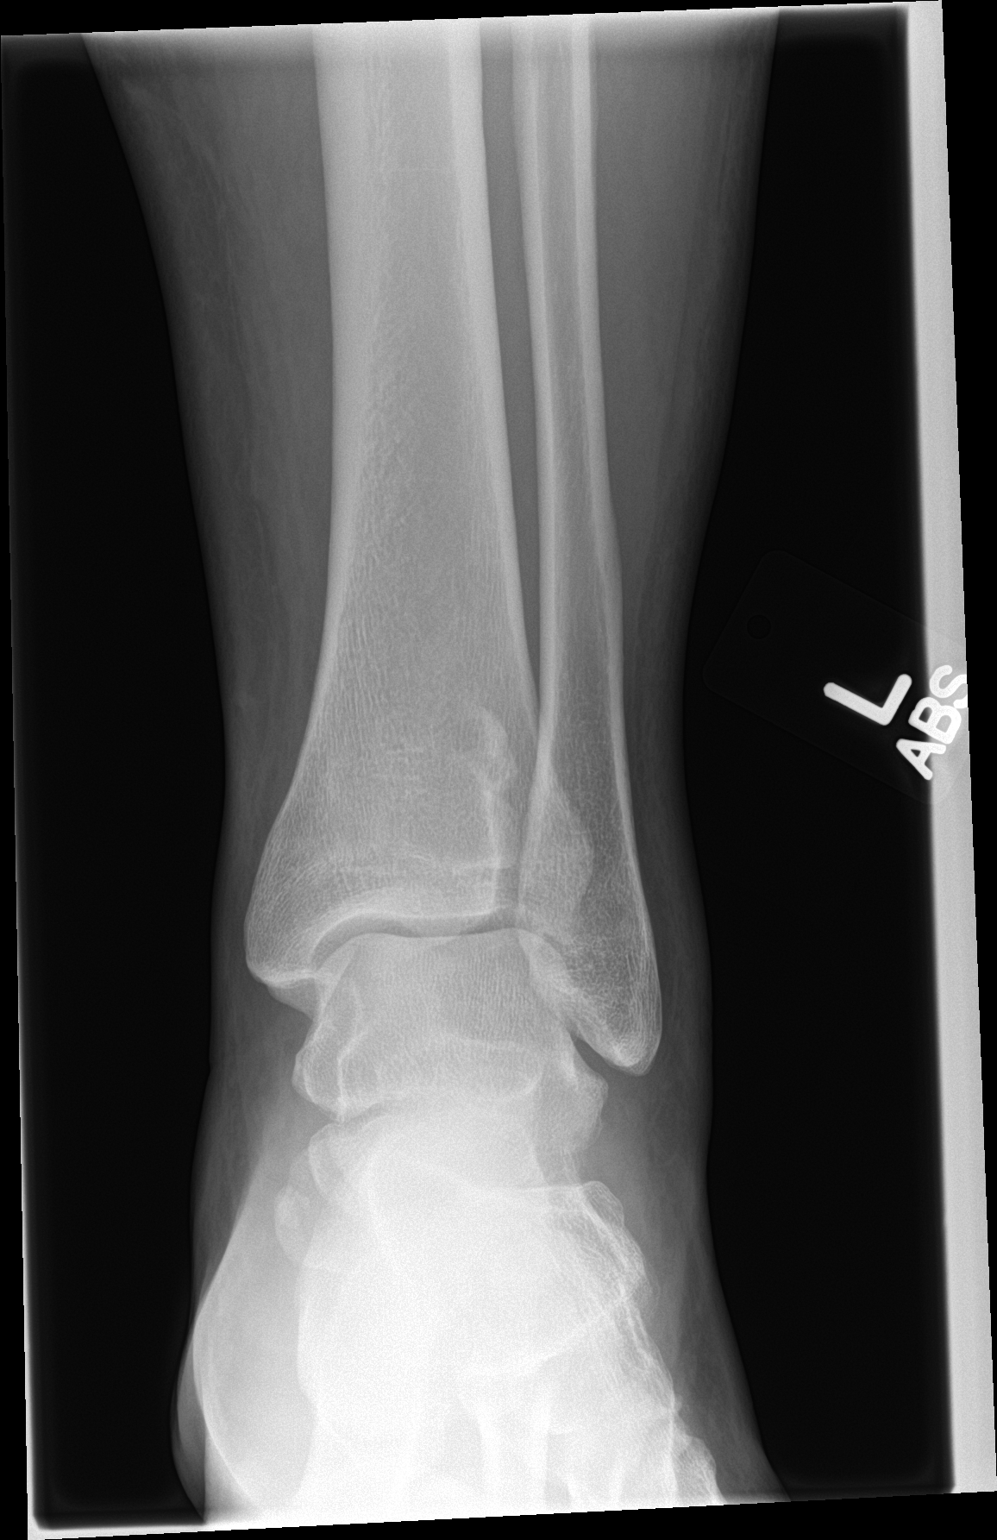

[ankle obl]
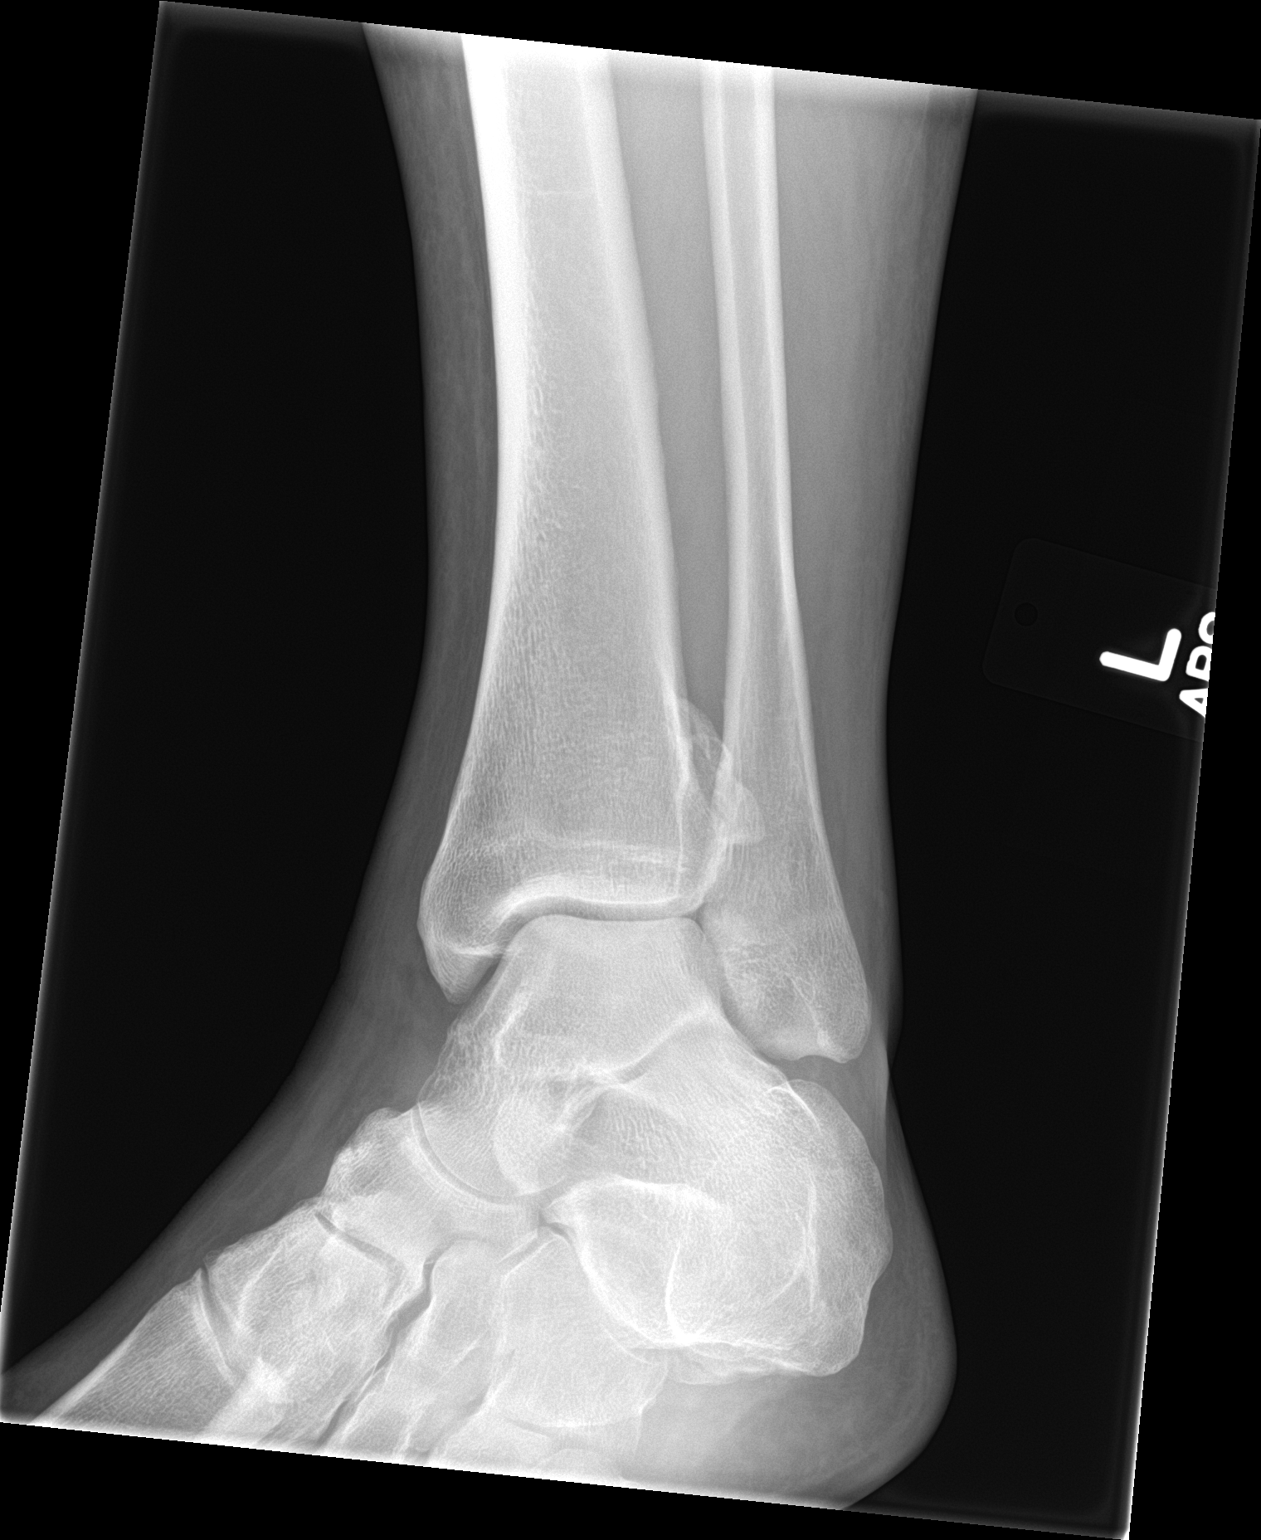

[ankle lat]
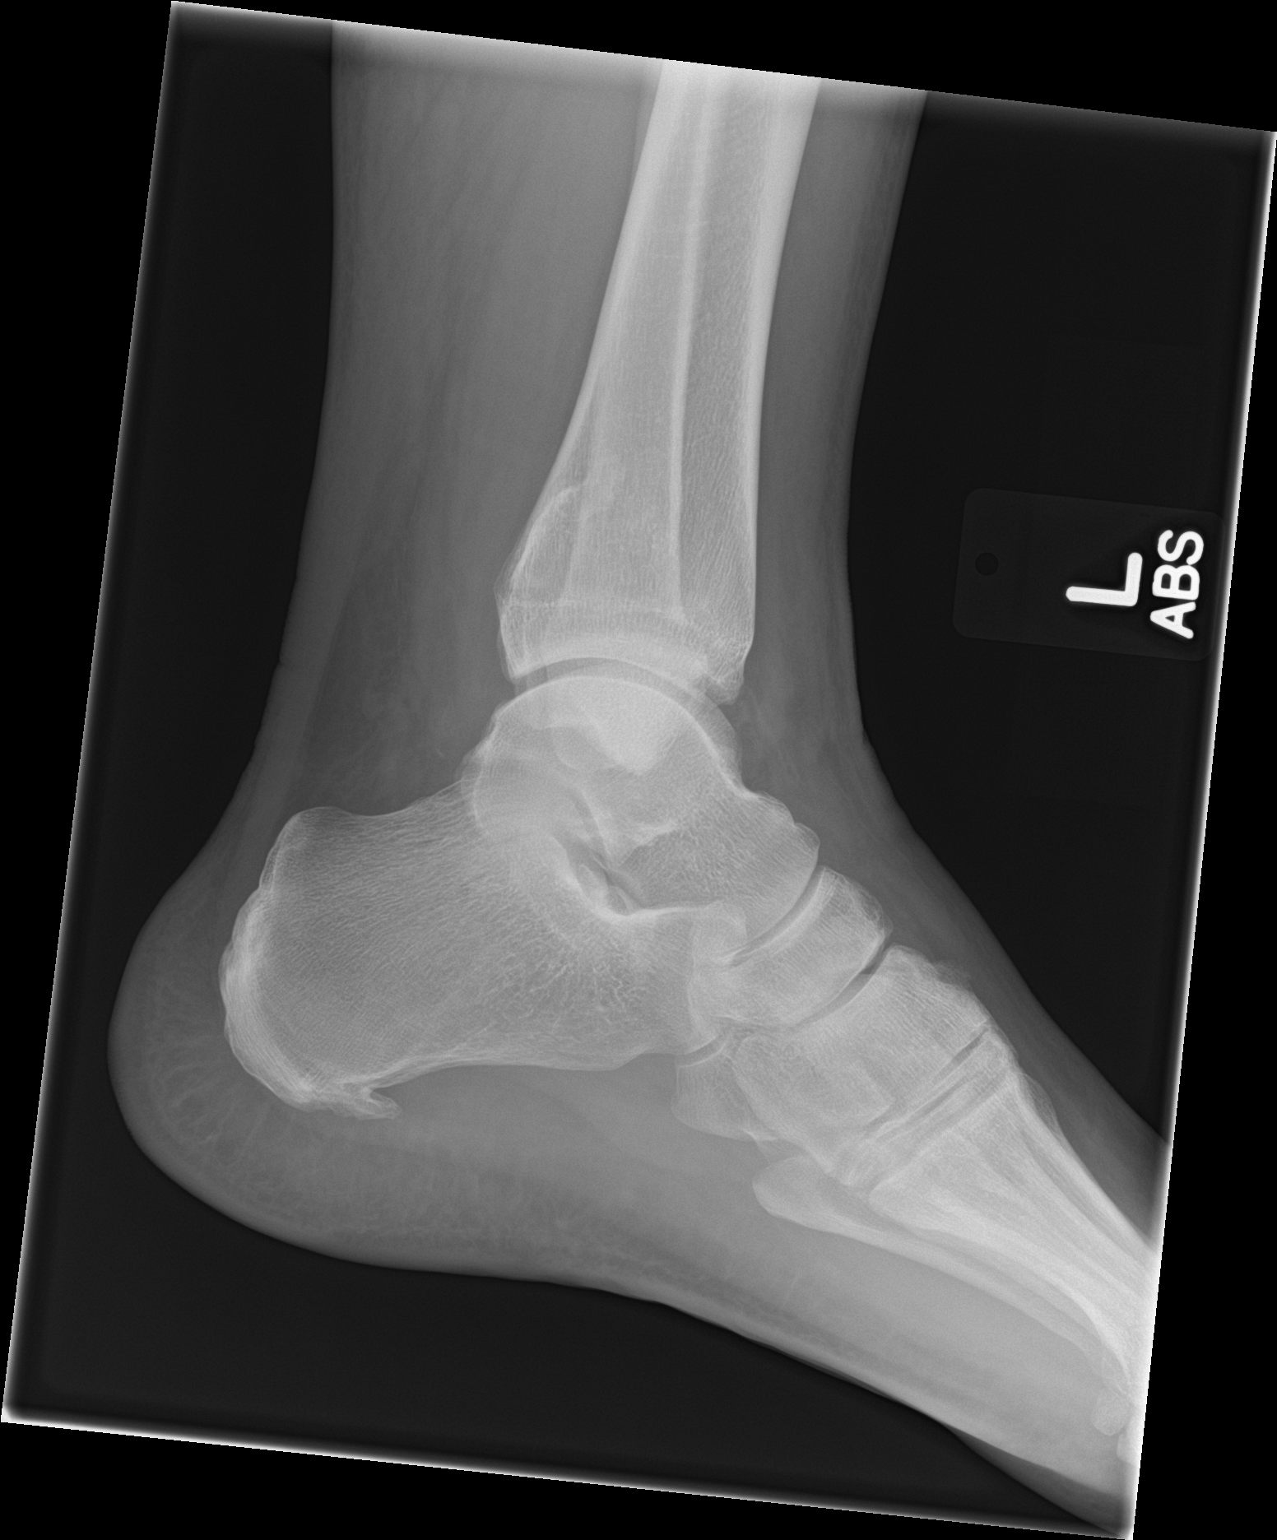

[3 of 3 positions shown; findings below may reference images not displayed]

FINDINGS: There is a chronic appearing posterior tibia exostosis which might
be sequelae of remote trauma. Mortise joint alignment is preserved.
Talar dome intact. No definite joint effusion. Calcaneus intact with
degenerative spurring. Left fibula appears intact. No acute fracture
or dislocation identified.
IMPRESSION: 1. No acute fracture or dislocation identified about the left ankle.
2. Bulky posterior malleolus exostosis is chronic and might be
sequelae of prior trauma.

## 2017-06-01 ENCOUNTER — Other Ambulatory Visit: Payer: Self-pay | Admitting: Internal Medicine

## 2017-06-23 DIAGNOSIS — K635 Polyp of colon: Secondary | ICD-10-CM | POA: Diagnosis not present

## 2017-06-23 DIAGNOSIS — K640 First degree hemorrhoids: Secondary | ICD-10-CM | POA: Diagnosis not present

## 2017-06-23 DIAGNOSIS — Z1211 Encounter for screening for malignant neoplasm of colon: Secondary | ICD-10-CM | POA: Diagnosis not present

## 2017-06-23 DIAGNOSIS — K648 Other hemorrhoids: Secondary | ICD-10-CM | POA: Diagnosis not present

## 2017-06-23 DIAGNOSIS — D129 Benign neoplasm of anus and anal canal: Secondary | ICD-10-CM | POA: Diagnosis not present

## 2017-06-23 LAB — HM COLONOSCOPY

## 2017-08-06 LAB — HM DIABETES EYE EXAM

## 2017-08-08 ENCOUNTER — Other Ambulatory Visit: Payer: Self-pay | Admitting: Internal Medicine

## 2017-09-05 ENCOUNTER — Other Ambulatory Visit: Payer: Self-pay | Admitting: Internal Medicine

## 2017-10-06 ENCOUNTER — Other Ambulatory Visit (INDEPENDENT_AMBULATORY_CARE_PROVIDER_SITE_OTHER): Payer: BLUE CROSS/BLUE SHIELD

## 2017-10-06 DIAGNOSIS — E119 Type 2 diabetes mellitus without complications: Secondary | ICD-10-CM | POA: Diagnosis not present

## 2017-10-06 DIAGNOSIS — E7800 Pure hypercholesterolemia, unspecified: Secondary | ICD-10-CM

## 2017-10-06 DIAGNOSIS — D649 Anemia, unspecified: Secondary | ICD-10-CM | POA: Diagnosis not present

## 2017-10-06 DIAGNOSIS — N400 Enlarged prostate without lower urinary tract symptoms: Secondary | ICD-10-CM | POA: Diagnosis not present

## 2017-10-06 LAB — BASIC METABOLIC PANEL
BUN: 16 mg/dL (ref 6–23)
CO2: 30 mEq/L (ref 19–32)
CREATININE: 0.97 mg/dL (ref 0.40–1.50)
Calcium: 9.2 mg/dL (ref 8.4–10.5)
Chloride: 100 mEq/L (ref 96–112)
GFR: 86.24 mL/min (ref >60.00)
GLUCOSE: 144 mg/dL — AB (ref 70–99)
Potassium: 3.9 mEq/L (ref 3.5–5.1)
Sodium: 138 mEq/L (ref 135–145)

## 2017-10-06 LAB — CBC WITH DIFFERENTIAL/PLATELET
BASOS ABS: 0 10*3/uL (ref 0.0–0.1)
BASOS PCT: 0.6 % (ref 0.0–3.0)
EOS PCT: 2.4 % (ref 0.0–5.0)
Eosinophils Absolute: 0.2 10*3/uL (ref 0.0–0.7)
HEMATOCRIT: 43.7 % (ref 39.0–52.0)
Hemoglobin: 14.8 g/dL (ref 13.0–17.0)
LYMPHS ABS: 2.2 10*3/uL (ref 0.7–4.0)
Lymphocytes Relative: 30.7 % (ref 12.0–46.0)
MCHC: 33.9 g/dL (ref 30.0–36.0)
MCV: 89.1 fl (ref 78.0–100.0)
MONOS PCT: 6.8 % (ref 3.0–12.0)
Monocytes Absolute: 0.5 10*3/uL (ref 0.1–1.0)
NEUTROS ABS: 4.3 10*3/uL (ref 1.4–7.7)
Neutrophils Relative %: 59.5 % (ref 43.0–77.0)
PLATELETS: 325 10*3/uL (ref 150.0–400.0)
RBC: 4.9 Mil/uL (ref 4.22–5.81)
RDW: 13.7 % (ref 11.5–15.5)
WBC: 7.3 10*3/uL (ref 4.0–10.5)

## 2017-10-06 LAB — LIPID PANEL
CHOL/HDL RATIO: 4
Cholesterol: 151 mg/dL (ref 0–200)
HDL: 40.6 mg/dL (ref >39.00)
LDL Cholesterol: 87 mg/dL (ref 0–99)
NONHDL: 110.75
Triglycerides: 119 mg/dL (ref 0.0–149.0)
VLDL: 23.8 mg/dL (ref 0.0–40.0)

## 2017-10-06 LAB — FERRITIN: Ferritin: 135.1 ng/mL (ref 22.0–322.0)

## 2017-10-06 LAB — HEPATIC FUNCTION PANEL
ALT: 21 U/L (ref 0–53)
AST: 17 U/L (ref 0–37)
Albumin: 4 g/dL (ref 3.5–5.2)
Alkaline Phosphatase: 74 U/L (ref 39–117)
BILIRUBIN DIRECT: 0.1 mg/dL (ref 0.0–0.3)
BILIRUBIN TOTAL: 0.6 mg/dL (ref 0.2–1.2)
Total Protein: 6.9 g/dL (ref 6.0–8.3)

## 2017-10-06 LAB — PSA: PSA: 4.38 ng/mL — AB (ref 0.10–4.00)

## 2017-10-06 LAB — HEMOGLOBIN A1C: Hgb A1c MFr Bld: 7.1 % — ABNORMAL HIGH (ref 4.6–6.5)

## 2017-10-08 ENCOUNTER — Ambulatory Visit (INDEPENDENT_AMBULATORY_CARE_PROVIDER_SITE_OTHER): Payer: BLUE CROSS/BLUE SHIELD | Admitting: Internal Medicine

## 2017-10-08 ENCOUNTER — Encounter: Payer: Self-pay | Admitting: Internal Medicine

## 2017-10-08 VITALS — BP 142/80 | HR 86 | Temp 98.5°F | Ht 70.0 in | Wt 249.2 lb

## 2017-10-08 DIAGNOSIS — K219 Gastro-esophageal reflux disease without esophagitis: Secondary | ICD-10-CM | POA: Diagnosis not present

## 2017-10-08 DIAGNOSIS — E7800 Pure hypercholesterolemia, unspecified: Secondary | ICD-10-CM

## 2017-10-08 DIAGNOSIS — I10 Essential (primary) hypertension: Secondary | ICD-10-CM | POA: Diagnosis not present

## 2017-10-08 DIAGNOSIS — N400 Enlarged prostate without lower urinary tract symptoms: Secondary | ICD-10-CM

## 2017-10-08 DIAGNOSIS — E119 Type 2 diabetes mellitus without complications: Secondary | ICD-10-CM | POA: Diagnosis not present

## 2017-10-08 DIAGNOSIS — R972 Elevated prostate specific antigen [PSA]: Secondary | ICD-10-CM

## 2017-10-08 NOTE — Progress Notes (Signed)
Pre visit review using our clinic review tool, if applicable. No additional management support is needed unless otherwise documented below in the visit note. 

## 2017-10-08 NOTE — Progress Notes (Signed)
Patient ID: Aaron Allison., male   DOB: Nov 06, 1965, 52 y.o.   MRN: [id number]   Subjective:    Patient ID: Aaron Allison., male    DOB: [birth year], 52 y.o.   MRN: [id number]  HPI  Patient here for a scheduled follow up.  He reports he is doing relatively well.  Working a lot of hours.  No time off recently.  Handling this relatively well.  Is not doing formal exercise now.  Not watching his diet as well.  No chest pain.  No sob.  No acid reflux.  No abdominal pain.  Bowels moving.  No urine change.  Discussed labs.  Discussed elevated a1c.  Discussed importance of checking his sugars.  Discussed elevated psa.  Agrees to referral to urology.     Past Medical History:  Diagnosis Date  . GERD (gastroesophageal reflux disease)   . Hypercholesterolemia   . Hyperglycemia   . Hypertension   . Iron deficiency anemia    Past Surgical History:  Procedure Laterality Date  . INGUINAL HERNIA REPAIR     Family History  Problem Relation Age of Onset  . Hypertension Mother   . Hypertension Father   . CVA Paternal Grandfather   . Colon cancer Neg Hx   . Prostate cancer Neg Hx    Social History   Socioeconomic History  . Marital status: Married    Spouse name: None  . Number of children: 2  . Years of education: None  . Highest education level: None  Social Needs  . Financial resource strain: None  . Food insecurity - worry: None  . Food insecurity - inability: None  . Transportation needs - medical: None  . Transportation needs - non-medical: None  Occupational History  . None  Tobacco Use  . Smoking status: Never Smoker  . Smokeless tobacco: Never Used  Substance and Sexual Activity  . Alcohol use: No    Alcohol/week: 0.0 oz  . Drug use: No  . Sexual activity: None  Other Topics Concern  . None  Social History Narrative  . None    Outpatient Encounter Medications as of 10/08/2017  Medication Sig  . aspirin 81 MG tablet Take 81 mg by mouth daily.  Marland Kitchen atorvastatin  (LIPITOR) 40 MG tablet TAKE 1 TABLET DAILY  . ferrous sulfate 325 (65 FE) MG tablet Take 325 mg by mouth daily.   . Lancets (ACCU-CHEK SOFT TOUCH) lancets Use as instructed  . losartan-hydrochlorothiazide (HYZAAR) 100-12.5 MG tablet TAKE 1 TABLET DAILY  . meloxicam (MOBIC) 7.5 MG tablet Take 1 tablet (7.5 mg total) by mouth daily.  . metFORMIN (GLUCOPHAGE) 500 MG tablet Take 1 tablet (500 mg total) by mouth 2 (two) times daily with a meal.  . metoprolol succinate (TOPROL-XL) 25 MG 24 hr tablet Take 1 tablet (25 mg total) by mouth daily.  Marland Kitchen omeprazole (PRILOSEC) 40 MG capsule TAKE 1 CAPSULE DAILY   No facility-administered encounter medications on file as of 10/08/2017.     Review of Systems  Constitutional: Negative for appetite change and unexpected weight change.  HENT: Negative for congestion and sinus pressure.   Respiratory: Negative for cough, chest tightness and shortness of breath.   Cardiovascular: Negative for chest pain, palpitations and leg swelling.  Gastrointestinal: Negative for abdominal pain, diarrhea, nausea and vomiting.  Genitourinary: Negative for difficulty urinating and dysuria.  Musculoskeletal: Negative for back pain and joint swelling.  Skin: Negative for color change and rash.  Neurological: Negative for  dizziness, light-headedness and headaches.  Psychiatric/Behavioral: Negative for agitation and dysphoric mood.       Objective:    Physical Exam  Constitutional: He appears well-developed and well-nourished. No distress.  HENT:  Nose: Nose normal.  Mouth/Throat: Oropharynx is clear and moist.  Neck: Neck supple. No thyromegaly present.  Cardiovascular: Normal rate and regular rhythm.  Pulmonary/Chest: Effort normal and breath sounds normal. No respiratory distress.  Abdominal: Soft. Bowel sounds are normal. There is no tenderness.  Musculoskeletal: He exhibits no edema or tenderness.  Lymphadenopathy:    He has no cervical adenopathy.  Skin: No rash  noted. No erythema.  Psychiatric: He has a normal mood and affect. His behavior is normal.    BP (!) 142/80   Pulse 86   Temp 98.5 F (36.9 C) (Oral)   Ht 5' 10"  (1.778 m)   Wt 249 lb 3.2 oz (113 kg)   SpO2 96%   BMI 35.76 kg/m  Wt Readings from Last 3 Encounters:  10/08/17 249 lb 3.2 oz (113 kg)  05/20/17 241 lb 9.6 oz (109.6 kg)  02/06/17 239 lb 12.8 oz (108.8 kg)     Lab Results  Component Value Date   WBC 7.3 10/06/2017   HGB 14.8 10/06/2017   HCT 43.7 10/06/2017   PLT 325.0 10/06/2017   GLUCOSE 144 (H) 10/06/2017   CHOL 151 10/06/2017   TRIG 119.0 10/06/2017   HDL 40.60 10/06/2017   LDLCALC 87 10/06/2017   ALT 21 10/06/2017   AST 17 10/06/2017   NA 138 10/06/2017   K 3.9 10/06/2017   CL 100 10/06/2017   CREATININE 0.97 10/06/2017   BUN 16 10/06/2017   CO2 30 10/06/2017   TSH 1.94 05/18/2017   PSA 4.38 (H) 10/06/2017   HGBA1C 7.1 (H) 10/06/2017   MICROALBUR 1.6 05/18/2017       Assessment & Plan:   Problem List Items Addressed This Visit    Diabetes mellitus ([place])    Discussed his a1c.  Discussed diet adjustment and exercise.  Low carb diet.  Hold on making adjustments in his medication.  Send in readings over the next few weeks.  Follow met b and a1c.        Enlarged prostate    S/p biopsy.  Previous biopsy ok.  No urinary symptoms.  psa elevated.  Previously saw Dr Bernardo Heater.  Refer back to urology.        GERD (gastroesophageal reflux disease)    Controlled on omeprazole.        Hypercholesterolemia    On lipitor.  Low cholesterol diet and exercise.  Follow lipid panel and liver function tests.        Hypertension    Blood pressure slightly elevated on initial check.  Have him spot check his pressure.  Get him back in soon to reassess.         Other Visit Diagnoses    Elevated PSA    -  Primary   refer to urology.    Relevant Orders   Ambulatory referral to Urology       Einar Pheasant, MD

## 2017-10-11 ENCOUNTER — Encounter: Payer: Self-pay | Admitting: Internal Medicine

## 2017-10-11 NOTE — Assessment & Plan Note (Signed)
S/p biopsy.  Previous biopsy ok.  No urinary symptoms.  psa elevated.  Previously saw Dr Lonna CobbStoioff.  Refer back to urology.

## 2017-10-11 NOTE — Assessment & Plan Note (Signed)
On lipitor.  Low cholesterol diet and exercise.  Follow lipid panel and liver function tests.   

## 2017-10-11 NOTE — Assessment & Plan Note (Signed)
Blood pressure slightly elevated on initial check.  Have him spot check his pressure.  Get him back in soon to reassess.

## 2017-10-11 NOTE — Assessment & Plan Note (Signed)
Discussed his a1c.  Discussed diet adjustment and exercise.  Low carb diet.  Hold on making adjustments in his medication.  Send in readings over the next few weeks.  Follow met b and a1c.

## 2017-10-11 NOTE — Assessment & Plan Note (Signed)
Controlled on omeprazole.   

## 2017-10-20 ENCOUNTER — Other Ambulatory Visit: Payer: Self-pay | Admitting: *Deleted

## 2017-10-20 ENCOUNTER — Telehealth: Payer: Self-pay | Admitting: Internal Medicine

## 2017-10-20 MED ORDER — METOPROLOL SUCCINATE ER 25 MG PO TB24: 25.0000 mg | ORAL_TABLET | Freq: Every day | ORAL | 0 refills | Status: DC

## 2017-10-20 MED ORDER — METOPROLOL SUCCINATE ER 25 MG PO TB24: 25.0000 mg | ORAL_TABLET | Freq: Every day | ORAL | 1 refills | Status: DC

## 2017-10-20 NOTE — Progress Notes (Signed)
Refill done for 15 tablets until his meds arrive from [facility].

## 2017-10-20 NOTE — Telephone Encounter (Signed)
Pt called, no answer. Left message for him to [facility] refill on 15 tablets of Toprol-XL.

## 2017-10-20 NOTE — Telephone Encounter (Signed)
Copied from CRM [phone] [phone]. Topic: Quick Communication - Rx Refill/Question >> Oct 20, 2017 10:00 AM Viviann SpareWhite, [place] wrote: Medication: metoprolol succinate (TOPROL-XL) 25 MG 24 hr tablet - (TOTALLY OUT OF HIS MED)  (PATIENT WIFE CALL TO SEE IF Dr. Lorin PicketSCOTT COULD CALL IN A FEW TABLETS FOR THE ABOVE MED TO THE PHARMACY BELOW, IT WILL BE A FEW DAYS BEFORE [facility] CAN DELIVERY THE MED)   Has the patient contacted their pharmacy? Yes.     (Agent: If no, request that the patient contact the pharmacy for the refill.)   Preferred Pharmacy (with phone number or street name):  [facility] [address] - [place], [place] - [id number] [place] [id number] Phone: [phone] Fax: [phone]     Agent: Please be advised that RX refills may take up to 3 business days. We ask that you follow-up with your pharmacy.

## 2017-10-29 ENCOUNTER — Other Ambulatory Visit: Payer: Self-pay | Admitting: Internal Medicine

## 2017-12-11 ENCOUNTER — Ambulatory Visit: Payer: BLUE CROSS/BLUE SHIELD | Admitting: Internal Medicine

## 2018-01-01 DIAGNOSIS — N401 Enlarged prostate with lower urinary tract symptoms: Secondary | ICD-10-CM | POA: Diagnosis not present

## 2018-01-01 DIAGNOSIS — R339 Retention of urine, unspecified: Secondary | ICD-10-CM | POA: Insufficient documentation

## 2018-01-01 DIAGNOSIS — N138 Other obstructive and reflux uropathy: Secondary | ICD-10-CM | POA: Diagnosis not present

## 2018-01-01 DIAGNOSIS — R972 Elevated prostate specific antigen [PSA]: Secondary | ICD-10-CM | POA: Diagnosis not present

## 2018-02-01 DIAGNOSIS — R972 Elevated prostate specific antigen [PSA]: Secondary | ICD-10-CM | POA: Diagnosis not present

## 2018-02-06 ENCOUNTER — Other Ambulatory Visit: Payer: Self-pay | Admitting: Internal Medicine

## 2018-03-05 ENCOUNTER — Ambulatory Visit (INDEPENDENT_AMBULATORY_CARE_PROVIDER_SITE_OTHER): Payer: BLUE CROSS/BLUE SHIELD | Admitting: Internal Medicine

## 2018-03-05 ENCOUNTER — Encounter

## 2018-03-05 ENCOUNTER — Encounter: Payer: Self-pay | Admitting: Internal Medicine

## 2018-03-05 DIAGNOSIS — I10 Essential (primary) hypertension: Secondary | ICD-10-CM | POA: Diagnosis not present

## 2018-03-05 DIAGNOSIS — E7800 Pure hypercholesterolemia, unspecified: Secondary | ICD-10-CM | POA: Diagnosis not present

## 2018-03-05 DIAGNOSIS — E119 Type 2 diabetes mellitus without complications: Secondary | ICD-10-CM | POA: Diagnosis not present

## 2018-03-05 DIAGNOSIS — D649 Anemia, unspecified: Secondary | ICD-10-CM | POA: Diagnosis not present

## 2018-03-05 DIAGNOSIS — K219 Gastro-esophageal reflux disease without esophagitis: Secondary | ICD-10-CM | POA: Diagnosis not present

## 2018-03-05 DIAGNOSIS — N400 Enlarged prostate without lower urinary tract symptoms: Secondary | ICD-10-CM | POA: Diagnosis not present

## 2018-03-05 MED ORDER — METOPROLOL SUCCINATE ER 25 MG PO TB24: 25.0000 mg | ORAL_TABLET | Freq: Every day | ORAL | 3 refills | Status: DC

## 2018-03-05 MED ORDER — METFORMIN HCL 500 MG PO TABS: 500.0000 mg | ORAL_TABLET | Freq: Two times a day (BID) | ORAL | 3 refills | Status: DC

## 2018-03-05 MED ORDER — LOSARTAN POTASSIUM-HCTZ 100-12.5 MG PO TABS: 1.0000 | ORAL_TABLET | Freq: Every day | ORAL | 3 refills | Status: DC

## 2018-03-05 MED ORDER — ATORVASTATIN CALCIUM 40 MG PO TABS: 40.0000 mg | ORAL_TABLET | Freq: Every day | ORAL | 3 refills | Status: DC

## 2018-03-05 NOTE — Progress Notes (Signed)
Patient ID: Aaron Headland., male   DOB: 1966-09-16, 52 y.o.   MRN: [id number]   Subjective:    Patient ID: Aaron Headland., male    DOB: [birth year], 52 y.o.   MRN: [id number]  HPI  Patient here for a scheduled follow up.  States he is doing well.  Tries to stay active.  Has not been doing formal exercise as much lately.  No chest pain.  No sob.  No acid reflux.  No abdominal pain.  Bowels moving.  Sugars attached.  AM sugars averaging 130-150s.  Slightly elevated.  Blood pressures doing well.     Past Medical History:  Diagnosis Date  . GERD (gastroesophageal reflux disease)   . Hypercholesterolemia   . Hyperglycemia   . Hypertension   . Iron deficiency anemia    Past Surgical History:  Procedure Laterality Date  . INGUINAL HERNIA REPAIR     Family History  Problem Relation Age of Onset  . Hypertension Mother   . Hypertension Father   . CVA Paternal Grandfather   . Colon cancer Neg Hx   . Prostate cancer Neg Hx    Social History   Socioeconomic History  . Marital status: Married    Spouse name: Not on file  . Number of children: 2  . Years of education: Not on file  . Highest education level: Not on file  Occupational History  . Not on file  Social Needs  . Financial resource strain: Not on file  . Food insecurity:    Worry: Not on file    Inability: Not on file  . Transportation needs:    Medical: Not on file    Non-medical: Not on file  Tobacco Use  . Smoking status: Never Smoker  . Smokeless tobacco: Never Used  Substance and Sexual Activity  . Alcohol use: No    Alcohol/week: 0.0 oz  . Drug use: No  . Sexual activity: Not on file  Lifestyle  . Physical activity:    Days per week: Not on file    Minutes per session: Not on file  . Stress: Not on file  Relationships  . Social connections:    Talks on phone: Not on file    Gets together: Not on file    Attends religious service: Not on file    Active member of club or organization: Not on file   Attends meetings of clubs or organizations: Not on file    Relationship status: Not on file  Other Topics Concern  . Not on file  Social History Narrative  . Not on file    Outpatient Encounter Medications as of 03/05/2018  Medication Sig  . aspirin 81 MG tablet Take 81 mg by mouth daily.  Marland Kitchen atorvastatin (LIPITOR) 40 MG tablet Take 1 tablet (40 mg total) by mouth daily.  . ferrous sulfate 325 (65 FE) MG tablet Take 325 mg by mouth daily.   . Lancets (ACCU-CHEK SOFT TOUCH) lancets Use as instructed  . losartan-hydrochlorothiazide (HYZAAR) 100-12.5 MG tablet Take 1 tablet by mouth daily.  . meloxicam (MOBIC) 7.5 MG tablet Take 1 tablet (7.5 mg total) by mouth daily.  . metFORMIN (GLUCOPHAGE) 500 MG tablet Take 1 tablet (500 mg total) by mouth 2 (two) times daily with a meal.  . metoprolol succinate (TOPROL-XL) 25 MG 24 hr tablet Take 1 tablet (25 mg total) by mouth daily.  . metoprolol succinate (TOPROL-XL) 25 MG 24 hr tablet Take 1 tablet (25 mg  total) by mouth daily.  Marland Kitchen omeprazole (PRILOSEC) 40 MG capsule TAKE 1 CAPSULE DAILY  . tamsulosin (FLOMAX) 0.4 MG CAPS capsule   . [DISCONTINUED] atorvastatin (LIPITOR) 40 MG tablet TAKE 1 TABLET DAILY  . [DISCONTINUED] losartan-hydrochlorothiazide (HYZAAR) 100-12.5 MG tablet TAKE 1 TABLET DAILY  . [DISCONTINUED] metFORMIN (GLUCOPHAGE) 500 MG tablet TAKE 1 TABLET TWICE A DAY WITH MEALS  . [DISCONTINUED] metoprolol succinate (TOPROL-XL) 25 MG 24 hr tablet Take 1 tablet (25 mg total) by mouth daily.   No facility-administered encounter medications on file as of 03/05/2018.     Review of Systems  Constitutional: Negative for appetite change and unexpected weight change.  HENT: Negative for congestion and sinus pressure.   Respiratory: Negative for cough, chest tightness and shortness of breath.   Cardiovascular: Negative for chest pain, palpitations and leg swelling.  Gastrointestinal: Negative for abdominal pain, diarrhea, nausea and vomiting.    Genitourinary: Negative for difficulty urinating and dysuria.  Musculoskeletal: Negative for joint swelling and myalgias.  Skin: Negative for color change and rash.  Neurological: Negative for dizziness, light-headedness and headaches.  Psychiatric/Behavioral: Negative for agitation and dysphoric mood.       Objective:    Physical Exam  Constitutional: He appears well-developed and well-nourished. No distress.  HENT:  Nose: Nose normal.  Mouth/Throat: Oropharynx is clear and moist.  Neck: Neck supple. No thyromegaly present.  Cardiovascular: Normal rate and regular rhythm.  Pulmonary/Chest: Effort normal and breath sounds normal. No respiratory distress.  Abdominal: Soft. Bowel sounds are normal. There is no tenderness.  Musculoskeletal: He exhibits no edema or tenderness.  Lymphadenopathy:    He has no cervical adenopathy.  Skin: No rash noted. No erythema.  Psychiatric: He has a normal mood and affect. His behavior is normal.    BP 128/78   Pulse 90   Temp 98.4 F (36.9 C) (Oral)   Resp 18   Wt 249 lb (112.9 kg)   SpO2 97%   BMI 35.73 kg/m  Wt Readings from Last 3 Encounters:  03/05/18 249 lb (112.9 kg)  10/08/17 249 lb 3.2 oz (113 kg)  05/20/17 241 lb 9.6 oz (109.6 kg)     Lab Results  Component Value Date   WBC 7.3 10/06/2017   HGB 14.8 10/06/2017   HCT 43.7 10/06/2017   PLT 325.0 10/06/2017   GLUCOSE 144 (H) 10/06/2017   CHOL 151 10/06/2017   TRIG 119.0 10/06/2017   HDL 40.60 10/06/2017   LDLCALC 87 10/06/2017   ALT 21 10/06/2017   AST 17 10/06/2017   NA 138 10/06/2017   K 3.9 10/06/2017   CL 100 10/06/2017   CREATININE 0.97 10/06/2017   BUN 16 10/06/2017   CO2 30 10/06/2017   TSH 1.94 05/18/2017   PSA 4.38 (H) 10/06/2017   HGBA1C 7.1 (H) 10/06/2017   MICROALBUR 1.6 05/18/2017    Dg Ankle Complete Left  Result Date: 03/13/2015 CLINICAL DATA:  52 year old with left ankle pain and swelling status post twisting injury while golfing. Lateral  side symptoms. Initial encounter. EXAM: LEFT ANKLE COMPLETE - 3+ VIEW COMPARISON:  None. FINDINGS: There is a chronic appearing posterior tibia exostosis which might be sequelae of remote trauma. Mortise joint alignment is preserved. Talar dome intact. No definite joint effusion. Calcaneus intact with degenerative spurring. Left fibula appears intact. No acute fracture or dislocation identified. IMPRESSION: 1. No acute fracture or dislocation identified about the left ankle. 2. 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 Electronically  Signed   By: Genevie Ann M.D.   On: 03/13/2015 08:48       Assessment & Plan:   Problem List Items Addressed This Visit    Anemia    Follow cbc.       Relevant Orders   CBC with Differential/Platelet   Ferritin   Diabetes mellitus ([place])    Sugars as outlined.  Discussed diet and exercise.  Follow met b and a1c.  Keep up to date with eye checks.        Relevant Medications   atorvastatin (LIPITOR) 40 MG tablet   metFORMIN (GLUCOPHAGE) 500 MG tablet   losartan-hydrochlorothiazide (HYZAAR) 100-12.5 MG tablet   Other Relevant Orders   Hemoglobin H2B   Basic metabolic panel   Enlarged prostate    S/p biopsy.  Being followed by urology.  Has f/u appt with Dr Pollyann Savoy 05/21/18.        GERD (gastroesophageal reflux disease)    Controlled on current regimen.  Follow.       Hypercholesterolemia    On lipitor.  Low cholesterol diet and exercise.  Follow lipid panel and liver function tests.        Relevant Medications   atorvastatin (LIPITOR) 40 MG tablet   metoprolol succinate (TOPROL-XL) 25 MG 24 hr tablet   losartan-hydrochlorothiazide (HYZAAR) 100-12.5 MG tablet   Other Relevant Orders   Hepatic function panel   Lipid panel   Hypertension    Blood pressure on recheck improved.  His checks under good control.  Continue same medication regimen.  Follow pressures.  Follow metabolic panel.        Relevant  Medications   atorvastatin (LIPITOR) 40 MG tablet   metoprolol succinate (TOPROL-XL) 25 MG 24 hr tablet   losartan-hydrochlorothiazide (HYZAAR) 100-12.5 MG tablet       Einar Pheasant, MD

## 2018-03-09 ENCOUNTER — Encounter: Payer: Self-pay | Admitting: Internal Medicine

## 2018-03-09 NOTE — Assessment & Plan Note (Signed)
Blood pressure on recheck improved.  His checks under good control.  Continue same medication regimen.  Follow pressures.  Follow metabolic panel.

## 2018-03-09 NOTE — Assessment & Plan Note (Signed)
On lipitor.  Low cholesterol diet and exercise.  Follow lipid panel and liver function tests.   

## 2018-03-09 NOTE — Assessment & Plan Note (Signed)
Follow cbc.  

## 2018-03-09 NOTE — Assessment & Plan Note (Signed)
S/p biopsy.  Being followed by urology.  Has f/u appt with Dr Harlow MaresVipeakasit 05/21/18.

## 2018-03-09 NOTE — Assessment & Plan Note (Signed)
Sugars as outlined.  Discussed diet and exercise.  Follow met b and a1c.  Keep up to date with eye checks.

## 2018-03-09 NOTE — Assessment & Plan Note (Signed)
Controlled on current regimen.  Follow.  

## 2018-03-31 ENCOUNTER — Other Ambulatory Visit (INDEPENDENT_AMBULATORY_CARE_PROVIDER_SITE_OTHER): Payer: BLUE CROSS/BLUE SHIELD

## 2018-03-31 DIAGNOSIS — D649 Anemia, unspecified: Secondary | ICD-10-CM

## 2018-03-31 DIAGNOSIS — E7800 Pure hypercholesterolemia, unspecified: Secondary | ICD-10-CM

## 2018-03-31 DIAGNOSIS — E119 Type 2 diabetes mellitus without complications: Secondary | ICD-10-CM

## 2018-03-31 LAB — HEPATIC FUNCTION PANEL
ALBUMIN: 4 g/dL (ref 3.5–5.2)
ALK PHOS: 73 U/L (ref 39–117)
ALT: 17 U/L (ref 0–53)
AST: 15 U/L (ref 0–37)
Bilirubin, Direct: 0.1 mg/dL (ref 0.0–0.3)
Total Bilirubin: 0.6 mg/dL (ref 0.2–1.2)
Total Protein: 6.9 g/dL (ref 6.0–8.3)

## 2018-03-31 LAB — BASIC METABOLIC PANEL
BUN: 16 mg/dL (ref 6–23)
CALCIUM: 9 mg/dL (ref 8.4–10.5)
CO2: 29 mEq/L (ref 19–32)
Chloride: 102 mEq/L (ref 96–112)
Creatinine, Ser: 0.97 mg/dL (ref 0.40–1.50)
GFR: 86.08 mL/min (ref >60.00)
GLUCOSE: 136 mg/dL — AB (ref 70–99)
Potassium: 3.7 mEq/L (ref 3.5–5.1)
SODIUM: 139 meq/L (ref 135–145)

## 2018-03-31 LAB — CBC WITH DIFFERENTIAL/PLATELET
BASOS PCT: 0.9 % (ref 0.0–3.0)
Basophils Absolute: 0.1 10*3/uL (ref 0.0–0.1)
EOS ABS: 0.3 10*3/uL (ref 0.0–0.7)
Eosinophils Relative: 3.3 % (ref 0.0–5.0)
HEMATOCRIT: 40.9 % (ref 39.0–52.0)
Hemoglobin: 14.1 g/dL (ref 13.0–17.0)
LYMPHS PCT: 31.6 % (ref 12.0–46.0)
Lymphs Abs: 2.5 10*3/uL (ref 0.7–4.0)
MCHC: 34.4 g/dL (ref 30.0–36.0)
MCV: 87.2 fl (ref 78.0–100.0)
Monocytes Absolute: 0.6 10*3/uL (ref 0.1–1.0)
Monocytes Relative: 7.7 % (ref 3.0–12.0)
NEUTROS ABS: 4.4 10*3/uL (ref 1.4–7.7)
Neutrophils Relative %: 56.5 % (ref 43.0–77.0)
PLATELETS: 320 10*3/uL (ref 150.0–400.0)
RBC: 4.69 Mil/uL (ref 4.22–5.81)
RDW: 13.7 % (ref 11.5–15.5)
WBC: 7.8 10*3/uL (ref 4.0–10.5)

## 2018-03-31 LAB — LIPID PANEL
CHOLESTEROL: 137 mg/dL (ref 0–200)
HDL: 37.3 mg/dL — ABNORMAL LOW (ref >39.00)
LDL Cholesterol: 79 mg/dL (ref 0–99)
NONHDL: 100.1
TRIGLYCERIDES: 108 mg/dL (ref 0.0–149.0)
Total CHOL/HDL Ratio: 4
VLDL: 21.6 mg/dL (ref 0.0–40.0)

## 2018-03-31 LAB — FERRITIN: Ferritin: 101.9 ng/mL (ref 22.0–322.0)

## 2018-03-31 LAB — HEMOGLOBIN A1C: Hgb A1c MFr Bld: 7.2 % — ABNORMAL HIGH (ref 4.6–6.5)

## 2018-04-02 ENCOUNTER — Telehealth: Payer: Self-pay | Admitting: Internal Medicine

## 2018-04-02 NOTE — Telephone Encounter (Signed)
Patient calling back to receive lab results. Please advise.      Copied from CRM [phone]. Topic: Quick Communication - Lab Results >> Apr 02, 2018  2:56 PM Larry Sierrasavis, Trisha L, LPN wrote: Called patient to inform them of lab results. When patient returns call, triage nurse may disclose results.

## 2018-04-02 NOTE — Telephone Encounter (Signed)
Attempted to return call to pt for lab results but no answer this time. See result note.

## 2018-04-08 ENCOUNTER — Other Ambulatory Visit: Payer: Self-pay | Admitting: Internal Medicine

## 2018-04-08 MED ORDER — METFORMIN HCL 1000 MG PO TABS: 1000.0000 mg | ORAL_TABLET | Freq: Two times a day (BID) | ORAL | 1 refills | Status: DC

## 2018-04-08 NOTE — Progress Notes (Signed)
[facility]mg  bid #180 with 1 refill.

## 2018-06-23 ENCOUNTER — Ambulatory Visit (INDEPENDENT_AMBULATORY_CARE_PROVIDER_SITE_OTHER): Payer: BLUE CROSS/BLUE SHIELD | Admitting: Internal Medicine

## 2018-06-23 ENCOUNTER — Encounter: Payer: Self-pay | Admitting: Internal Medicine

## 2018-06-23 DIAGNOSIS — N400 Enlarged prostate without lower urinary tract symptoms: Secondary | ICD-10-CM | POA: Diagnosis not present

## 2018-06-23 DIAGNOSIS — E7800 Pure hypercholesterolemia, unspecified: Secondary | ICD-10-CM

## 2018-06-23 DIAGNOSIS — E119 Type 2 diabetes mellitus without complications: Secondary | ICD-10-CM | POA: Diagnosis not present

## 2018-06-23 DIAGNOSIS — D649 Anemia, unspecified: Secondary | ICD-10-CM

## 2018-06-23 DIAGNOSIS — Z0000 Encounter for general adult medical examination without abnormal findings: Secondary | ICD-10-CM

## 2018-06-23 DIAGNOSIS — K219 Gastro-esophageal reflux disease without esophagitis: Secondary | ICD-10-CM

## 2018-06-23 DIAGNOSIS — I10 Essential (primary) hypertension: Secondary | ICD-10-CM

## 2018-06-23 LAB — HM DIABETES FOOT EXAM

## 2018-06-23 NOTE — Progress Notes (Signed)
Patient ID: Aaron Deer., male   DOB: May 10, 1966, 52 y.o.   MRN: [id number]   Subjective:    Patient ID: Aaron Deer., male    DOB: Aug 12, 1966, 52 y.o.   MRN: [id number]  HPI Patient here for his physical exam.  He reports he is doing well.  Has adjusted his diet.  Not exercises as much, but plans to start.  Does stay physically active.  Has lost weight.  No chest pain.  No sob.  No acid reflux.  No abdominal pain.  Bowels moving.  States am sugars averaging 110-120s.  Saw GI.  Had f/u colonoscopy recently.  Need results.  Blood pressures averaging 110-120s/70s.     Past Medical History:  Diagnosis Date  . GERD (gastroesophageal reflux disease)   . Hypercholesterolemia   . Hyperglycemia   . Hypertension   . Iron deficiency anemia    Past Surgical History:  Procedure Laterality Date  . INGUINAL HERNIA REPAIR     Family History  Problem Relation Age of Onset  . Hypertension Mother   . Hypertension Father   . CVA Paternal Grandfather   . Colon cancer Neg Hx   . Prostate cancer Neg Hx    Social History   Socioeconomic History  . Marital status: Married    Spouse name: Not on file  . Number of children: 2  . Years of education: Not on file  . Highest education level: Not on file  Occupational History  . Not on file  Social Needs  . Financial resource strain: Not on file  . Food insecurity:    Worry: Not on file    Inability: Not on file  . Transportation needs:    Medical: Not on file    Non-medical: Not on file  Tobacco Use  . Smoking status: Never Smoker  . Smokeless tobacco: Never Used  Substance and Sexual Activity  . Alcohol use: No    Alcohol/week: 0.0 standard drinks  . Drug use: No  . Sexual activity: Not on file  Lifestyle  . Physical activity:    Days per week: Not on file    Minutes per session: Not on file  . Stress: Not on file  Relationships  . Social connections:    Talks on phone: Not on file    Gets together: Not on file    Attends  religious service: Not on file    Active member of club or organization: Not on file    Attends meetings of clubs or organizations: Not on file    Relationship status: Not on file  Other Topics Concern  . Not on file  Social History Narrative  . Not on file    Outpatient Encounter Medications as of 06/23/2018  Medication Sig  . aspirin 81 MG tablet Take 81 mg by mouth daily.  Marland Kitchen atorvastatin (LIPITOR) 40 MG tablet Take 1 tablet (40 mg total) by mouth daily.  . ferrous sulfate 325 (65 FE) MG tablet Take 325 mg by mouth daily.   . Lancets (ACCU-CHEK SOFT TOUCH) lancets Use as instructed  . losartan-hydrochlorothiazide (HYZAAR) 100-12.5 MG tablet Take 1 tablet by mouth daily.  . meloxicam (MOBIC) 7.5 MG tablet Take 1 tablet (7.5 mg total) by mouth daily.  . metFORMIN (GLUCOPHAGE) 1000 MG tablet Take 1 tablet (1,000 mg total) by mouth 2 (two) times daily with a meal.  . metoprolol succinate (TOPROL-XL) 25 MG 24 hr tablet Take 1 tablet (25 mg total) by mouth  daily.  . omeprazole (PRILOSEC) 40 MG capsule Take 1 capsule (40 mg total) by mouth daily.  . tamsulosin (FLOMAX) 0.4 MG CAPS capsule   . [DISCONTINUED] atorvastatin (LIPITOR) 40 MG tablet Take 1 tablet (40 mg total) by mouth daily.  . [DISCONTINUED] losartan-hydrochlorothiazide (HYZAAR) 100-12.5 MG tablet Take 1 tablet by mouth daily.  . [DISCONTINUED] metoprolol succinate (TOPROL-XL) 25 MG 24 hr tablet Take 1 tablet (25 mg total) by mouth daily.  . [DISCONTINUED] omeprazole (PRILOSEC) 40 MG capsule TAKE 1 CAPSULE DAILY   No facility-administered encounter medications on file as of 06/23/2018.     Review of Systems  Constitutional: Negative for appetite change and unexpected weight change.  HENT: Negative for congestion and sinus pressure.   Eyes: Negative for pain and visual disturbance.  Respiratory: Negative for cough, chest tightness and shortness of breath.   Cardiovascular: Negative for chest pain, palpitations and leg swelling.   Gastrointestinal: Negative for abdominal pain, diarrhea and nausea.  Genitourinary: Negative for difficulty urinating and dysuria.  Musculoskeletal: Negative for joint swelling and myalgias.  Skin: Negative for color change and rash.  Neurological: Negative for dizziness, light-headedness and headaches.  Hematological: Negative for adenopathy. Does not bruise/bleed easily.  Psychiatric/Behavioral: Negative for agitation and dysphoric mood.       Objective:     Blood pressure rechecked by me:  120/78  Physical Exam  Constitutional: He is oriented to person, place, and time. He appears well-developed and well-nourished. No distress.  HENT:  Head: Normocephalic and atraumatic.  Nose: Nose normal.  Mouth/Throat: Oropharynx is clear and moist. No oropharyngeal exudate.  Eyes: Conjunctivae are normal. Right eye exhibits no discharge. Left eye exhibits no discharge.  Neck: Neck supple. No thyromegaly present.  Cardiovascular: Normal rate and regular rhythm.  Pulmonary/Chest: Breath sounds normal. No respiratory distress. He has no wheezes.  Abdominal: Soft. Bowel sounds are normal. There is no tenderness.  Genitourinary: Rectum normal and penis normal.  Musculoskeletal: He exhibits no edema or tenderness.  Lymphadenopathy:    He has no cervical adenopathy.  Neurological: He is alert and oriented to person, place, and time.  Skin: No rash noted. No erythema.  Psychiatric: He has a normal mood and affect. His behavior is normal.    BP 130/82 (BP Location: Left Arm, Patient Position: Sitting, Cuff Size: Normal)   Pulse 73   Temp 98.2 F (36.8 C) (Oral)   Resp 18   Ht 5\' 10"  (1.778 m)   Wt 222 lb 3.2 oz (100.8 kg)   SpO2 98%   BMI 31.88 kg/m  Wt Readings from Last 3 Encounters:  06/23/18 222 lb 3.2 oz (100.8 kg)  03/05/18 249 lb (112.9 kg)  10/08/17 249 lb 3.2 oz (113 kg)     Lab Results  Component Value Date   WBC 7.8 03/31/2018   HGB 14.1 03/31/2018   HCT 40.9  03/31/2018   PLT 320.0 03/31/2018   GLUCOSE 136 (H) 03/31/2018   CHOL 137 03/31/2018   TRIG 108.0 03/31/2018   HDL 37.30 (L) 03/31/2018   LDLCALC 79 03/31/2018   ALT 17 03/31/2018   AST 15 03/31/2018   NA 139 03/31/2018   K 3.7 03/31/2018   CL 102 03/31/2018   CREATININE 0.97 03/31/2018   BUN 16 03/31/2018   CO2 29 03/31/2018   TSH 1.94 05/18/2017   PSA 4.38 (H) 10/06/2017   HGBA1C 7.2 (H) 03/31/2018   MICROALBUR 1.6 05/18/2017       Assessment & Plan:   Problem  List Items Addressed This Visit    Anemia    Follow cbc.       Relevant Orders   CBC with Differential/Platelet   Ferritin   Diabetes mellitus (HCC)    Has adjusted his diet.  Sugars as outlined.  Outside sugar readings reviewed.  Has lost weight.  Continue current medication regimen.  Follow pressures.  Follow metabolic panel.        Relevant Medications   losartan-hydrochlorothiazide (HYZAAR) 100-12.5 MG tablet   atorvastatin (LIPITOR) 40 MG tablet   Other Relevant Orders   Hemoglobin A1c   Basic metabolic panel   Microalbumin / creatinine urine ratio   Enlarged prostate    S/p biopsy.  Being followed by urology.  Has f/u with Dr Harlow Mares.        Relevant Orders   PSA   GERD (gastroesophageal reflux disease)    Controlled on current regimen.  Follow.        Relevant Medications   omeprazole (PRILOSEC) 40 MG capsule   Health care maintenance    Physical today 06/23/18.  Colonoscopy just performed.  Need results.  Being followed by urology for psa.        Hypercholesterolemia    On lipitor.  Low cholesterol diet and exercise.  Follow lipid panel and liver function tests.        Relevant Medications   losartan-hydrochlorothiazide (HYZAAR) 100-12.5 MG tablet   metoprolol succinate (TOPROL-XL) 25 MG 24 hr tablet   atorvastatin (LIPITOR) 40 MG tablet   Other Relevant Orders   Hepatic function panel   Lipid panel   Hypertension    Blood pressure under good control.  Continue same medication  regimen.  Follow pressures.  Follow metabolic panel.        Relevant Medications   losartan-hydrochlorothiazide (HYZAAR) 100-12.5 MG tablet   metoprolol succinate (TOPROL-XL) 25 MG 24 hr tablet   atorvastatin (LIPITOR) 40 MG tablet   Other Relevant Orders   TSH       Dale Gilbert, MD

## 2018-06-23 NOTE — Assessment & Plan Note (Addendum)
Physical today 06/23/18.  Colonoscopy just performed.  Need results.  Being followed by urology for psa.

## 2018-06-25 DIAGNOSIS — N390 Urinary tract infection, site not specified: Secondary | ICD-10-CM | POA: Diagnosis not present

## 2018-06-25 DIAGNOSIS — N400 Enlarged prostate without lower urinary tract symptoms: Secondary | ICD-10-CM | POA: Diagnosis not present

## 2018-06-25 DIAGNOSIS — R972 Elevated prostate specific antigen [PSA]: Secondary | ICD-10-CM | POA: Diagnosis not present

## 2018-06-25 DIAGNOSIS — Z6831 Body mass index (BMI) 31.0-31.9, adult: Secondary | ICD-10-CM | POA: Diagnosis not present

## 2018-06-25 DIAGNOSIS — I10 Essential (primary) hypertension: Secondary | ICD-10-CM | POA: Diagnosis not present

## 2018-06-25 DIAGNOSIS — N401 Enlarged prostate with lower urinary tract symptoms: Secondary | ICD-10-CM | POA: Diagnosis not present

## 2018-06-25 DIAGNOSIS — N138 Other obstructive and reflux uropathy: Secondary | ICD-10-CM | POA: Diagnosis not present

## 2018-06-25 DIAGNOSIS — E119 Type 2 diabetes mellitus without complications: Secondary | ICD-10-CM | POA: Diagnosis not present

## 2018-06-25 DIAGNOSIS — E7800 Pure hypercholesterolemia, unspecified: Secondary | ICD-10-CM | POA: Diagnosis not present

## 2018-06-25 DIAGNOSIS — Z7982 Long term (current) use of aspirin: Secondary | ICD-10-CM | POA: Diagnosis not present

## 2018-06-26 ENCOUNTER — Encounter: Payer: Self-pay | Admitting: Internal Medicine

## 2018-06-26 MED ORDER — OMEPRAZOLE 40 MG PO CPDR: 40.0000 mg | DELAYED_RELEASE_CAPSULE | Freq: Every day | ORAL | 1 refills | Status: DC

## 2018-06-26 MED ORDER — METOPROLOL SUCCINATE ER 25 MG PO TB24: 25.0000 mg | ORAL_TABLET | Freq: Every day | ORAL | 3 refills | Status: DC

## 2018-06-26 MED ORDER — ATORVASTATIN CALCIUM 40 MG PO TABS: 40.0000 mg | ORAL_TABLET | Freq: Every day | ORAL | 3 refills | Status: DC

## 2018-06-26 MED ORDER — LOSARTAN POTASSIUM-HCTZ 100-12.5 MG PO TABS: 1.0000 | ORAL_TABLET | Freq: Every day | ORAL | 3 refills | Status: DC

## 2018-06-26 NOTE — Assessment & Plan Note (Signed)
Has adjusted his diet.  Sugars as outlined.  Outside sugar readings reviewed.  Has lost weight.  Continue current medication regimen.  Follow pressures.  Follow metabolic panel.

## 2018-06-26 NOTE — Assessment & Plan Note (Signed)
Follow cbc.  

## 2018-06-26 NOTE — Assessment & Plan Note (Signed)
On lipitor.  Low cholesterol diet and exercise.  Follow lipid panel and liver function tests.   

## 2018-06-26 NOTE — Assessment & Plan Note (Signed)
S/p biopsy.  Being followed by urology.  Has f/u with Dr Harlow Mares.

## 2018-06-26 NOTE — Assessment & Plan Note (Signed)
Controlled on current regimen.  Follow.  

## 2018-06-26 NOTE — Assessment & Plan Note (Signed)
Blood pressure under good control.  Continue same medication regimen.  Follow pressures.  Follow metabolic panel.   

## 2018-07-01 ENCOUNTER — Encounter: Payer: Self-pay | Admitting: Internal Medicine

## 2018-08-12 LAB — HM DIABETES EYE EXAM

## 2018-08-30 ENCOUNTER — Other Ambulatory Visit (INDEPENDENT_AMBULATORY_CARE_PROVIDER_SITE_OTHER): Payer: BLUE CROSS/BLUE SHIELD

## 2018-08-30 DIAGNOSIS — D649 Anemia, unspecified: Secondary | ICD-10-CM | POA: Diagnosis not present

## 2018-08-30 DIAGNOSIS — E7800 Pure hypercholesterolemia, unspecified: Secondary | ICD-10-CM | POA: Diagnosis not present

## 2018-08-30 DIAGNOSIS — N400 Enlarged prostate without lower urinary tract symptoms: Secondary | ICD-10-CM

## 2018-08-30 DIAGNOSIS — I10 Essential (primary) hypertension: Secondary | ICD-10-CM | POA: Diagnosis not present

## 2018-08-30 DIAGNOSIS — E119 Type 2 diabetes mellitus without complications: Secondary | ICD-10-CM

## 2018-08-30 LAB — CBC WITH DIFFERENTIAL/PLATELET
BASOS PCT: 0.4 % (ref 0.0–3.0)
Basophils Absolute: 0 10*3/uL (ref 0.0–0.1)
EOS ABS: 0.3 10*3/uL (ref 0.0–0.7)
Eosinophils Relative: 2.9 % (ref 0.0–5.0)
HEMATOCRIT: 43.2 % (ref 39.0–52.0)
Hemoglobin: 14.5 g/dL (ref 13.0–17.0)
LYMPHS PCT: 26 % (ref 12.0–46.0)
Lymphs Abs: 2.3 10*3/uL (ref 0.7–4.0)
MCHC: 33.6 g/dL (ref 30.0–36.0)
MCV: 88.4 fl (ref 78.0–100.0)
MONO ABS: 0.6 10*3/uL (ref 0.1–1.0)
Monocytes Relative: 6.8 % (ref 3.0–12.0)
Neutro Abs: 5.7 10*3/uL (ref 1.4–7.7)
Neutrophils Relative %: 63.9 % (ref 43.0–77.0)
PLATELETS: 311 10*3/uL (ref 150.0–400.0)
RBC: 4.89 Mil/uL (ref 4.22–5.81)
RDW: 13.7 % (ref 11.5–15.5)
WBC: 8.9 10*3/uL (ref 4.0–10.5)

## 2018-08-30 LAB — BASIC METABOLIC PANEL
BUN: 17 mg/dL (ref 6–23)
CHLORIDE: 102 meq/L (ref 96–112)
CO2: 29 meq/L (ref 19–32)
Calcium: 9.1 mg/dL (ref 8.4–10.5)
Creatinine, Ser: 1.02 mg/dL (ref 0.40–1.50)
GFR: 81.1 mL/min (ref >60.00)
GLUCOSE: 103 mg/dL — AB (ref 70–99)
Potassium: 3.9 mEq/L (ref 3.5–5.1)
Sodium: 140 mEq/L (ref 135–145)

## 2018-08-30 LAB — LIPID PANEL
Cholesterol: 146 mg/dL (ref 0–200)
HDL: 39.8 mg/dL (ref >39.00)
LDL Cholesterol: 84 mg/dL (ref 0–99)
NonHDL: 106.14
TRIGLYCERIDES: 109 mg/dL (ref 0.0–149.0)
Total CHOL/HDL Ratio: 4
VLDL: 21.8 mg/dL (ref 0.0–40.0)

## 2018-08-30 LAB — HEPATIC FUNCTION PANEL
ALT: 15 U/L (ref 0–53)
AST: 11 U/L (ref 0–37)
Albumin: 3.8 g/dL (ref 3.5–5.2)
Alkaline Phosphatase: 66 U/L (ref 39–117)
Bilirubin, Direct: 0.1 mg/dL (ref 0.0–0.3)
Total Bilirubin: 0.6 mg/dL (ref 0.2–1.2)
Total Protein: 6.6 g/dL (ref 6.0–8.3)

## 2018-08-30 LAB — HEMOGLOBIN A1C: HEMOGLOBIN A1C: 6.3 % (ref 4.6–6.5)

## 2018-08-30 LAB — MICROALBUMIN / CREATININE URINE RATIO
CREATININE, U: 239.6 mg/dL
Microalb Creat Ratio: 0.6 mg/g (ref 0.0–30.0)
Microalb, Ur: 1.5 mg/dL (ref 0.0–1.9)

## 2018-08-30 LAB — PSA: PSA: 1.36 ng/mL (ref 0.10–4.00)

## 2018-08-30 LAB — TSH: TSH: 2.21 u[IU]/mL (ref 0.35–4.50)

## 2018-08-30 LAB — FERRITIN: Ferritin: 83.9 ng/mL (ref 22.0–322.0)

## 2018-10-27 ENCOUNTER — Other Ambulatory Visit: Payer: Self-pay | Admitting: Internal Medicine

## 2018-11-01 ENCOUNTER — Encounter: Payer: Self-pay | Admitting: Internal Medicine

## 2018-11-01 ENCOUNTER — Ambulatory Visit (INDEPENDENT_AMBULATORY_CARE_PROVIDER_SITE_OTHER): Payer: BLUE CROSS/BLUE SHIELD | Admitting: Internal Medicine

## 2018-11-01 DIAGNOSIS — E7800 Pure hypercholesterolemia, unspecified: Secondary | ICD-10-CM | POA: Diagnosis not present

## 2018-11-01 DIAGNOSIS — K219 Gastro-esophageal reflux disease without esophagitis: Secondary | ICD-10-CM

## 2018-11-01 DIAGNOSIS — E119 Type 2 diabetes mellitus without complications: Secondary | ICD-10-CM | POA: Diagnosis not present

## 2018-11-01 DIAGNOSIS — I10 Essential (primary) hypertension: Secondary | ICD-10-CM | POA: Diagnosis not present

## 2018-11-01 DIAGNOSIS — D649 Anemia, unspecified: Secondary | ICD-10-CM

## 2018-11-01 NOTE — Progress Notes (Signed)
Patient ID: [name]., male   DOB: [birth year], 53 y.o.   MRN: [id number]   Subjective:    Patient ID: [name]., male    DOB: 02-21-1966, 53 y.o.   MRN: [id number]  HPI  Patient here for a scheduled follow up.  He is doing well.  Feels good.  Trying to stay active.  No chest pain.  No sob.  No acid reflux. No abdominal pain.  Bowels moving.  No urine change.  Sugars averaging 120-140 in the am.  Has adjusted his diet.  Lost weight.     Past Medical History:  Diagnosis Date  . GERD (gastroesophageal reflux disease)   . Hypercholesterolemia   . Hyperglycemia   . Hypertension   . Iron deficiency anemia    Past Surgical History:  Procedure Laterality Date  . INGUINAL HERNIA REPAIR     Family History  Problem Relation Age of Onset  . Hypertension Mother   . Hypertension Father   . CVA Paternal Grandfather   . Colon cancer Neg Hx   . Prostate cancer Neg Hx    Social History   Socioeconomic History  . Marital status: Married    Spouse name: Not on file  . Number of children: 2  . Years of education: Not on file  . Highest education level: Not on file  Occupational History  . Not on file  Social Needs  . Financial resource strain: Not on file  . Food insecurity:    Worry: Not on file    Inability: Not on file  . Transportation needs:    Medical: Not on file    Non-medical: Not on file  Tobacco Use  . Smoking status: Never Smoker  . Smokeless tobacco: Never Used  Substance and Sexual Activity  . Alcohol use: No    Alcohol/week: 0.0 standard drinks  . Drug use: No  . Sexual activity: Not on file  Lifestyle  . Physical activity:    Days per week: Not on file    Minutes per session: Not on file  . Stress: Not on file  Relationships  . Social connections:    Talks on phone: Not on file    Gets together: Not on file    Attends religious service: Not on file    Active member of club or organization: Not on file    Attends meetings of clubs or  organizations: Not on file    Relationship status: Not on file  Other Topics Concern  . Not on file  Social History Narrative  . Not on file    Outpatient Encounter Medications as of 11/01/2018  Medication Sig  . aspirin 81 MG tablet Take 81 mg by mouth daily.  Marland Kitchen atorvastatin (LIPITOR) 40 MG tablet Take 1 tablet (40 mg total) by mouth daily.  . ferrous sulfate 325 (65 FE) MG tablet Take 325 mg by mouth daily.   . Lancets (ACCU-CHEK SOFT TOUCH) lancets Use as instructed  . losartan-hydrochlorothiazide (HYZAAR) 100-12.5 MG tablet Take 1 tablet by mouth daily.  . meloxicam (MOBIC) 7.5 MG tablet Take 1 tablet (7.5 mg total) by mouth daily.  . metFORMIN (GLUCOPHAGE) 1000 MG tablet TAKE 1 TABLET TWICE A DAY WITH MEALS  . metoprolol succinate (TOPROL-XL) 25 MG 24 hr tablet Take 1 tablet (25 mg total) by mouth daily.  Marland Kitchen omeprazole (PRILOSEC) 40 MG capsule Take 1 capsule (40 mg total) by mouth daily.  . tamsulosin (FLOMAX) 0.4 MG CAPS capsule  No facility-administered encounter medications on file as of 11/01/2018.     Review of Systems  Constitutional: Negative for appetite change and unexpected weight change.  HENT: Negative for congestion and sinus pressure.   Respiratory: Negative for cough, chest tightness and shortness of breath.   Cardiovascular: Negative for chest pain, palpitations and leg swelling.  Gastrointestinal: Negative for abdominal pain, diarrhea, nausea and vomiting.  Genitourinary: Negative for difficulty urinating and dysuria.  Musculoskeletal: Negative for joint swelling and myalgias.  Skin: Negative for color change and rash.  Neurological: Negative for dizziness, light-headedness and headaches.  Psychiatric/Behavioral: Negative for agitation and dysphoric mood.       Objective:    Physical Exam Constitutional:      General: He is not in acute distress.    Appearance: Normal appearance. He is well-developed.  HENT:     Nose: Nose normal. No congestion.      Mouth/Throat:     Pharynx: No oropharyngeal exudate or posterior oropharyngeal erythema.  Cardiovascular:     Rate and Rhythm: Normal rate and regular rhythm.  Pulmonary:     Effort: Pulmonary effort is normal. No respiratory distress.     Breath sounds: Normal breath sounds.  Abdominal:     General: Bowel sounds are normal.     Palpations: Abdomen is soft.     Tenderness: There is no abdominal tenderness.  Musculoskeletal:        General: No swelling or tenderness.  Skin:    Findings: No erythema or rash.  Neurological:     Mental Status: He is alert.  Psychiatric:        Mood and Affect: Mood normal.        Behavior: Behavior normal.     BP 122/70 (BP Location: Left Arm, Patient Position: Sitting, Cuff Size: Large)   Pulse 89   Temp 97.9 F (36.6 C) (Oral)   Resp 16   Wt 225 lb 6.4 oz (102.2 kg)   SpO2 98%   BMI 32.34 kg/m  Wt Readings from Last 3 Encounters:  11/01/18 225 lb 6.4 oz (102.2 kg)  06/23/18 222 lb 3.2 oz (100.8 kg)  03/05/18 249 lb (112.9 kg)     Lab Results  Component Value Date   WBC 8.9 08/30/2018   HGB 14.5 08/30/2018   HCT 43.2 08/30/2018   PLT 311.0 08/30/2018   GLUCOSE 103 (H) 08/30/2018   CHOL 146 08/30/2018   TRIG 109.0 08/30/2018   HDL 39.80 08/30/2018   LDLCALC 84 08/30/2018   ALT 15 08/30/2018   AST 11 08/30/2018   NA 140 08/30/2018   K 3.9 08/30/2018   CL 102 08/30/2018   CREATININE 1.02 08/30/2018   BUN 17 08/30/2018   CO2 29 08/30/2018   TSH 2.21 08/30/2018   PSA 1.36 08/30/2018   HGBA1C 6.3 08/30/2018   MICROALBUR 1.5 08/30/2018    Dg Ankle Complete Left  Result Date: 03/13/2015 CLINICAL DATA:  53 year old with left ankle pain and swelling status post twisting injury while golfing. Lateral side symptoms. Initial encounter. EXAM: LEFT ANKLE COMPLETE - 3+ VIEW COMPARISON:  None. FINDINGS: There is a chronic appearing posterior tibia exostosis which might be sequelae of remote trauma. Mortise joint alignment is preserved.  Talar dome intact. No definite joint effusion. Calcaneus intact with degenerative spurring. Left fibula appears intact. No acute fracture or dislocation identified. IMPRESSION: 1. No acute fracture or dislocation identified about the left ankle. 2. Bulky posterior malleolus exostosis is chronic and might be sequelae of  prior trauma. Electronically Signed   By: Genevie Ann M.D.   On: 03/13/2015 08:48       Assessment & Plan:   Problem List Items Addressed This Visit    Anemia    Follow cbc.        Diabetes mellitus ([place])    Low carb diet and exercise.  Follow met b and a1c.  Sugars as outlined.        Relevant Orders   Hemoglobin G2I   Basic metabolic panel   GERD (gastroesophageal reflux disease)    Controlled on current regimen.  Follow.        Hypercholesterolemia    On lipitor.  Low cholesterol diet and exercise.  Follow lipid panel and liver function tests.        Relevant Orders   Hepatic function panel   Lipid panel   Hypertension    Blood pressure under good control.  Continue same medication regimen.  Follow pressures.  Follow metabolic panel.            Einar Pheasant, MD

## 2018-11-07 NOTE — Assessment & Plan Note (Signed)
Follow cbc.  

## 2018-11-07 NOTE — Assessment & Plan Note (Signed)
Blood pressure under good control.  Continue same medication regimen.  Follow pressures.  Follow metabolic panel.   

## 2018-11-07 NOTE — Assessment & Plan Note (Signed)
On lipitor.  Low cholesterol diet and exercise.  Follow lipid panel and liver function tests.   

## 2018-11-07 NOTE — Assessment & Plan Note (Signed)
Low carb diet and exercise.  Follow met b and a1c.  Sugars as outlined.   

## 2018-11-07 NOTE — Assessment & Plan Note (Signed)
Controlled on current regimen.  Follow.  

## 2019-03-02 ENCOUNTER — Other Ambulatory Visit: Payer: Self-pay | Admitting: Internal Medicine

## 2019-03-09 ENCOUNTER — Other Ambulatory Visit: Payer: Self-pay | Admitting: Internal Medicine

## 2019-03-17 ENCOUNTER — Other Ambulatory Visit: Payer: Self-pay

## 2019-03-17 ENCOUNTER — Other Ambulatory Visit (INDEPENDENT_AMBULATORY_CARE_PROVIDER_SITE_OTHER): Payer: BC Managed Care – PPO

## 2019-03-17 DIAGNOSIS — E7800 Pure hypercholesterolemia, unspecified: Secondary | ICD-10-CM | POA: Diagnosis not present

## 2019-03-17 DIAGNOSIS — E119 Type 2 diabetes mellitus without complications: Secondary | ICD-10-CM

## 2019-03-17 LAB — LIPID PANEL
Cholesterol: 133 mg/dL (ref 0–200)
HDL: 42.4 mg/dL (ref >39.00)
LDL Cholesterol: 64 mg/dL (ref 0–99)
NonHDL: 90.19
Total CHOL/HDL Ratio: 3
Triglycerides: 130 mg/dL (ref 0.0–149.0)
VLDL: 26 mg/dL (ref 0.0–40.0)

## 2019-03-17 LAB — BASIC METABOLIC PANEL
BUN: 16 mg/dL (ref 6–23)
CO2: 31 mEq/L (ref 19–32)
Calcium: 9.2 mg/dL (ref 8.4–10.5)
Chloride: 100 mEq/L (ref 96–112)
Creatinine, Ser: 0.99 mg/dL (ref 0.40–1.50)
GFR: 78.81 mL/min (ref >60.00)
Glucose, Bld: 106 mg/dL — ABNORMAL HIGH (ref 70–99)
Potassium: 3.9 mEq/L (ref 3.5–5.1)
Sodium: 138 mEq/L (ref 135–145)

## 2019-03-17 LAB — HEPATIC FUNCTION PANEL
ALT: 21 U/L (ref 0–53)
AST: 17 U/L (ref 0–37)
Albumin: 4 g/dL (ref 3.5–5.2)
Alkaline Phosphatase: 68 U/L (ref 39–117)
Bilirubin, Direct: 0.1 mg/dL (ref 0.0–0.3)
Total Bilirubin: 0.6 mg/dL (ref 0.2–1.2)
Total Protein: 6.4 g/dL (ref 6.0–8.3)

## 2019-03-17 LAB — HEMOGLOBIN A1C: Hgb A1c MFr Bld: 6.8 % — ABNORMAL HIGH (ref 4.6–6.5)

## 2019-03-22 ENCOUNTER — Ambulatory Visit (INDEPENDENT_AMBULATORY_CARE_PROVIDER_SITE_OTHER): Payer: BC Managed Care – PPO | Admitting: Internal Medicine

## 2019-03-22 ENCOUNTER — Encounter: Payer: Self-pay | Admitting: Internal Medicine

## 2019-03-22 ENCOUNTER — Other Ambulatory Visit: Payer: Self-pay

## 2019-03-22 DIAGNOSIS — D649 Anemia, unspecified: Secondary | ICD-10-CM | POA: Diagnosis not present

## 2019-03-22 DIAGNOSIS — N400 Enlarged prostate without lower urinary tract symptoms: Secondary | ICD-10-CM

## 2019-03-22 DIAGNOSIS — R040 Epistaxis: Secondary | ICD-10-CM

## 2019-03-22 DIAGNOSIS — E119 Type 2 diabetes mellitus without complications: Secondary | ICD-10-CM | POA: Diagnosis not present

## 2019-03-22 DIAGNOSIS — E7800 Pure hypercholesterolemia, unspecified: Secondary | ICD-10-CM

## 2019-03-22 DIAGNOSIS — I10 Essential (primary) hypertension: Secondary | ICD-10-CM

## 2019-03-22 DIAGNOSIS — K219 Gastro-esophageal reflux disease without esophagitis: Secondary | ICD-10-CM

## 2019-03-22 NOTE — Progress Notes (Signed)
Patient ID: Aaron Allison., male   DOB: [birth year], 53 y.o.   MRN: [id number]   Virtual Visit via telephone Note  This visit type was conducted due to national recommendations for restrictions regarding the COVID-19 pandemic (e.g. social distancing).  This format is felt to be most appropriate for this patient at this time.  All issues noted in this document were discussed and addressed.  No physical exam was performed (except for noted visual exam findings with Video Visits).   I connected with Aaron Allison by telephone and verified that I am speaking with the correct person using two identifiers. Location patient: home Location provider: work Persons participating in the telephone visit: patient, provider  I discussed the limitations, risks, security and privacy concerns of performing an evaluation and management service by telephone and the availability of in person appointments. The patient expressed understanding and agreed to proceed.   Reason for visit: scheduled follow up.   HPI: He reports he is doing well.  Feels good.  Trying to stay active.  No chest pain.  No sob.  No acid reflux.  No abdominal pain.  Bowels moving.  Reports nose bleed today.  Stopped now.  Has a history of nose bleeds - few years ago.  Saw ENT.  Resolved.  No issue until today.  No bleeding now.  No trauma to nose.  No sinus pressure or congestion.  States pulled what appeared to be flap of skin from his nose and the bleeding started.  Discussed labs.  a1c 6.8.  Discussed low carb diet and exercise.  Weight 234 pounds.  States blood pressure has been under good control.    ROS: See pertinent positives and negatives per HPI.  Past Medical History:  Diagnosis Date  . GERD (gastroesophageal reflux disease)   . Hypercholesterolemia   . Hyperglycemia   . Hypertension   . Iron deficiency anemia     Past Surgical History:  Procedure Laterality Date  . INGUINAL HERNIA REPAIR      Family History  Problem  Relation Age of Onset  . Hypertension Mother   . Hypertension Father   . CVA Paternal Grandfather   . Colon cancer Neg Hx   . Prostate cancer Neg Hx     SOCIAL HX: reviewed.    Current Outpatient Medications:  .  aspirin 81 MG tablet, Take 81 mg by mouth daily., Disp: , Rfl:  .  atorvastatin (LIPITOR) 40 MG tablet, Take 1 tablet (40 mg total) by mouth daily., Disp: 90 tablet, Rfl: 3 .  ferrous sulfate 325 (65 FE) MG tablet, Take 325 mg by mouth daily. , Disp: , Rfl:  .  Lancets (ACCU-CHEK SOFT TOUCH) lancets, Use as instructed, Disp: 100 each, Rfl: 5 .  losartan-hydrochlorothiazide (HYZAAR) 100-12.5 MG tablet, Take 1 tablet by mouth daily., Disp: 90 tablet, Rfl: 3 .  meloxicam (MOBIC) 7.5 MG tablet, Take 1 tablet (7.5 mg total) by mouth daily., Disp: 30 tablet, Rfl: 0 .  metFORMIN (GLUCOPHAGE) 1000 MG tablet, TAKE 1 TABLET TWICE A DAY WITH MEALS, Disp: 180 tablet, Rfl: 4 .  metoprolol succinate (TOPROL-XL) 25 MG 24 hr tablet, TAKE 1 TABLET DAILY, Disp: 90 tablet, Rfl: 3 .  omeprazole (PRILOSEC) 40 MG capsule, TAKE 1 CAPSULE DAILY, Disp: 90 capsule, Rfl: 3 .  tamsulosin (FLOMAX) 0.4 MG CAPS capsule, , Disp: , Rfl:   EXAM:  VITALS per patient if applicable: 633/35 - left.   GENERAL: alert.  Sounds to be in no acute  distress.  Answering questions appropriately.    PSYCH/NEURO: pleasant and cooperative, no obvious depression or anxiety, speech and thought processing grossly intact  ASSESSMENT AND PLAN:  Discussed the following assessment and plan:  Anemia Follow cbc.   Diabetes mellitus Low carb diet and exercise.  Follow met b and a1c.  Lab Results  Component Value Date   HGBA1C 6.8 (H) 03/17/2019    Enlarged prostate S/p biopsy.  Being followed by urology.  Request refill for tamsulosin.    GERD (gastroesophageal reflux disease) Controlled on current regimen.  Follow.    Hypercholesterolemia On lipitor.  Low cholesterol diet and exercise.  Follow lipid panel and  liver function tests.    Hypertension Blood pressure has been under good control.  Slight elevation last check.  Follow pressures.  Follow metabolic panel.  Hold on making changes in medication.  Follow.    Epistaxis Nosebleed as outlined.  Stopped now.  Will follow.  Notify me if bleeding returns/persist.      I discussed the assessment and treatment plan with the patient. The patient was provided an opportunity to ask questions and all were answered. The patient agreed with the plan and demonstrated an understanding of the instructions.   The patient was advised to call back or seek an in-person evaluation if the symptoms worsen or if the condition fails to improve as anticipated.  I provided 15 minutes of non-face-to-face time during this encounter.   Einar Pheasant, MD

## 2019-03-27 ENCOUNTER — Encounter: Payer: Self-pay | Admitting: Internal Medicine

## 2019-03-27 DIAGNOSIS — R040 Epistaxis: Secondary | ICD-10-CM | POA: Insufficient documentation

## 2019-03-27 NOTE — Assessment & Plan Note (Signed)
Blood pressure has been under good control.  Slight elevation last check.  Follow pressures.  Follow metabolic panel.  Hold on making changes in medication.  Follow.

## 2019-03-27 NOTE — Assessment & Plan Note (Signed)
Follow cbc.  

## 2019-03-27 NOTE — Assessment & Plan Note (Addendum)
Low carb diet and exercise.  Follow met b and a1c.  Lab Results  Component Value Date   HGBA1C 6.8 (H) 03/17/2019

## 2019-03-27 NOTE — Assessment & Plan Note (Signed)
S/p biopsy.  Being followed by urology.  Request refill for tamsulosin.

## 2019-03-27 NOTE — Assessment & Plan Note (Signed)
Controlled on current regimen.  Follow.  

## 2019-03-27 NOTE — Assessment & Plan Note (Signed)
On lipitor.  Low cholesterol diet and exercise.  Follow lipid panel and liver function tests.   

## 2019-03-27 NOTE — Assessment & Plan Note (Signed)
Nosebleed as outlined.  Stopped now.  Will follow.  Notify me if bleeding returns/persist.

## 2019-03-29 ENCOUNTER — Telehealth: Payer: Self-pay

## 2019-03-29 NOTE — Telephone Encounter (Signed)
Copied from [place] [phone]. Topic: Appointment Scheduling - Scheduling Inquiry for Clinic >> Mar 28, 2019  4:32 PM Mathis Bud wrote: Reason for CRM: Patient wife(Terry) called to set up an appt in October for a follow up appt.  [phone]

## 2019-03-29 NOTE — Telephone Encounter (Signed)
Lm for pt wife to call back and schedule cpe and labs for the end of October

## 2019-04-07 ENCOUNTER — Telehealth: Payer: Self-pay | Admitting: Internal Medicine

## 2019-04-07 NOTE — Telephone Encounter (Signed)
rx refilled for a year on 6/10. Left detailed message for patient

## 2019-04-07 NOTE — Telephone Encounter (Signed)
REFILL metoprolol succinate (TOPROL-XL) 25 MG 24 hr tablet PHARMACY [place], [place] - [address] [phone] (Phone) [phone] (Fax)

## 2019-04-12 ENCOUNTER — Telehealth: Payer: Self-pay | Admitting: Internal Medicine

## 2019-04-12 MED ORDER — TAMSULOSIN HCL 0.4 MG PO CAPS: 0.4000 mg | ORAL_CAPSULE | Freq: Every day | ORAL | 1 refills | Status: DC

## 2019-04-12 NOTE — Telephone Encounter (Signed)
Medication Refill - Medication: tamsulosin (FLOMAX) 0.4 MG CAPS capsule   Has the patient contacted their pharmacy? Yes.   (Agent: If no, request that the patient contact the pharmacy for the refill.) (Agent: If yes, when and what did the pharmacy advise?)  Preferred Pharmacy (with phone number or street name):  [facility], [place] [phone] (Phone) [phone] (Fax)     Agent: Please be advised that RX refills may take up to 3 business days. We ask that you follow-up with your pharmacy.

## 2019-04-12 NOTE — Telephone Encounter (Signed)
Left detailed message for patient letting him know rx was sent to pharmacy

## 2019-04-12 NOTE — Telephone Encounter (Signed)
Patient requesting Flomax.  Prescribed by a historical provider.  Last Refill:  02/12/18  Last PSA Lab:  08/30/18  Last OV:  03/22/19

## 2019-04-12 NOTE — Telephone Encounter (Signed)
[facility]

## 2019-04-22 MED ORDER — TAMSULOSIN HCL 0.4 MG PO CAPS: 0.4000 mg | ORAL_CAPSULE | Freq: Every day | ORAL | 0 refills | Status: DC

## 2019-04-22 NOTE — Telephone Encounter (Signed)
Left detailed message.   

## 2019-04-22 NOTE — Telephone Encounter (Signed)
I spoke with pt wife she will get Rx from [facility] and pay out of pocket.

## 2019-04-22 NOTE — Telephone Encounter (Signed)
Caller name: Blizzard,Terri Relation to pt: spouse  Call back number: mobile # [phone]  Pharmacy: [facility] [address], [place] - [place] [phone] (Phone) [phone] (Fax)     Reason for call:  Spouse states Mail order confirmed tamsulosin (FLOMAX) 0.4 MG CAPS capsule was mailed but cant give a delivery date and advised patient to call PCP and request a short supply sent to retail until mail order is received. Spouse requesting a follow up call today. Spouse states she doesn't want patient to go thru the weekend without medication, please advise

## 2019-04-22 NOTE — Telephone Encounter (Signed)
Dr Derrel Nip sent this in while you were out of office. Are you okay with sending in short supply to local pharmacy?

## 2019-04-22 NOTE — Telephone Encounter (Signed)
rx sent in to [facility] - 30 day supply (rx for flomax).  Please notify

## 2019-06-03 ENCOUNTER — Telehealth: Payer: Self-pay | Admitting: Internal Medicine

## 2019-06-03 DIAGNOSIS — M25562 Pain in left knee: Secondary | ICD-10-CM

## 2019-06-03 NOTE — Telephone Encounter (Signed)
Pt's spouse called in to request a referral to a specialist. Pt has been having left knee pain.  CB: [phone]

## 2019-06-07 NOTE — Telephone Encounter (Signed)
Patient is requesting referral to orthopedics. Has been having increased issues with his left knee. No injury noted. Where he works, he is standing on a concrete floor all day long. OK to do referral with out visit?

## 2019-06-07 NOTE — Telephone Encounter (Signed)
Notify pt that I have placed the order for ortho referral. Someone should be contacting him with an appt date and time.

## 2019-06-08 NOTE — Telephone Encounter (Signed)
Left detailed message for patient.

## 2019-06-13 DIAGNOSIS — S8392XA Sprain of unspecified site of left knee, initial encounter: Secondary | ICD-10-CM | POA: Diagnosis not present

## 2019-06-13 DIAGNOSIS — M25562 Pain in left knee: Secondary | ICD-10-CM | POA: Diagnosis not present

## 2019-07-09 ENCOUNTER — Other Ambulatory Visit: Payer: Self-pay | Admitting: Internal Medicine

## 2019-07-19 ENCOUNTER — Other Ambulatory Visit (INDEPENDENT_AMBULATORY_CARE_PROVIDER_SITE_OTHER): Payer: BC Managed Care – PPO

## 2019-07-19 ENCOUNTER — Other Ambulatory Visit: Payer: Self-pay

## 2019-07-19 DIAGNOSIS — I10 Essential (primary) hypertension: Secondary | ICD-10-CM | POA: Diagnosis not present

## 2019-07-19 DIAGNOSIS — E119 Type 2 diabetes mellitus without complications: Secondary | ICD-10-CM

## 2019-07-19 DIAGNOSIS — E7800 Pure hypercholesterolemia, unspecified: Secondary | ICD-10-CM

## 2019-07-19 LAB — LIPID PANEL
Cholesterol: 114 mg/dL (ref 0–200)
HDL: 36.7 mg/dL — ABNORMAL LOW (ref >39.00)
LDL Cholesterol: 58 mg/dL (ref 0–99)
NonHDL: 77.62
Total CHOL/HDL Ratio: 3
Triglycerides: 100 mg/dL (ref 0.0–149.0)
VLDL: 20 mg/dL (ref 0.0–40.0)

## 2019-07-19 LAB — HEPATIC FUNCTION PANEL
ALT: 17 U/L (ref 0–53)
AST: 14 U/L (ref 0–37)
Albumin: 3.9 g/dL (ref 3.5–5.2)
Alkaline Phosphatase: 59 U/L (ref 39–117)
Bilirubin, Direct: 0.1 mg/dL (ref 0.0–0.3)
Total Bilirubin: 0.7 mg/dL (ref 0.2–1.2)
Total Protein: 6.5 g/dL (ref 6.0–8.3)

## 2019-07-19 LAB — BASIC METABOLIC PANEL
BUN: 13 mg/dL (ref 6–23)
CO2: 30 mEq/L (ref 19–32)
Calcium: 9 mg/dL (ref 8.4–10.5)
Chloride: 102 mEq/L (ref 96–112)
Creatinine, Ser: 1.03 mg/dL (ref 0.40–1.50)
GFR: 75.2 mL/min (ref >60.00)
Glucose, Bld: 117 mg/dL — ABNORMAL HIGH (ref 70–99)
Potassium: 3.9 mEq/L (ref 3.5–5.1)
Sodium: 138 mEq/L (ref 135–145)

## 2019-07-19 LAB — HEMOGLOBIN A1C: Hgb A1c MFr Bld: 6.8 % — ABNORMAL HIGH (ref 4.6–6.5)

## 2019-07-25 ENCOUNTER — Other Ambulatory Visit: Payer: Self-pay

## 2019-07-25 ENCOUNTER — Ambulatory Visit (INDEPENDENT_AMBULATORY_CARE_PROVIDER_SITE_OTHER): Payer: BC Managed Care – PPO | Admitting: Internal Medicine

## 2019-07-25 ENCOUNTER — Encounter: Payer: Self-pay | Admitting: Internal Medicine

## 2019-07-25 VITALS — BP 126/70 | HR 76 | Temp 98.2°F | Resp 16 | Ht 70.0 in | Wt 237.6 lb

## 2019-07-25 DIAGNOSIS — E7800 Pure hypercholesterolemia, unspecified: Secondary | ICD-10-CM

## 2019-07-25 DIAGNOSIS — D649 Anemia, unspecified: Secondary | ICD-10-CM | POA: Diagnosis not present

## 2019-07-25 DIAGNOSIS — Z0000 Encounter for general adult medical examination without abnormal findings: Secondary | ICD-10-CM

## 2019-07-25 DIAGNOSIS — I10 Essential (primary) hypertension: Secondary | ICD-10-CM

## 2019-07-25 DIAGNOSIS — E119 Type 2 diabetes mellitus without complications: Secondary | ICD-10-CM | POA: Diagnosis not present

## 2019-07-25 DIAGNOSIS — K219 Gastro-esophageal reflux disease without esophagitis: Secondary | ICD-10-CM | POA: Diagnosis not present

## 2019-07-25 DIAGNOSIS — Z125 Encounter for screening for malignant neoplasm of prostate: Secondary | ICD-10-CM

## 2019-07-25 LAB — HM DIABETES FOOT EXAM

## 2019-07-25 NOTE — Assessment & Plan Note (Signed)
Physical today 07/25/19.  Followed by urology for psa.  Colonoscopy 05/2017. Need results.

## 2019-07-25 NOTE — Progress Notes (Signed)
Patient ID: Aaron Headland., male   DOB: 04-May-1966, 53 y.o.   MRN: [id number]   Subjective:    Patient ID: Aaron Headland., male    DOB: Aug 23, 1966, 53 y.o.   MRN: [id number]  HPI  Patient here for his physical exam.  He reports he is doing well.  Feels good.  Working a lot of extra hours.  Feels he is handling this well.  Stays active.  No chest pain.  No sob.  No acid reflux.  No abdominal pain.  Bowels moving.  Discussed diet and exercise.  Has gained some weight.  Recent a1c - 6.8.  Cholesterol ok.  Discussed labs.     Past Medical History:  Diagnosis Date  . GERD (gastroesophageal reflux disease)   . Hypercholesterolemia   . Hyperglycemia   . Hypertension   . Iron deficiency anemia    Past Surgical History:  Procedure Laterality Date  . INGUINAL HERNIA REPAIR     Family History  Problem Relation Age of Onset  . Hypertension Mother   . Hypertension Father   . CVA Paternal Grandfather   . Colon cancer Neg Hx   . Prostate cancer Neg Hx    Social History   Socioeconomic History  . Marital status: Married    Spouse name: Not on file  . Number of children: 2  . Years of education: Not on file  . Highest education level: Not on file  Occupational History  . Not on file  Social Needs  . Financial resource strain: Not on file  . Food insecurity    Worry: Not on file    Inability: Not on file  . Transportation needs    Medical: Not on file    Non-medical: Not on file  Tobacco Use  . Smoking status: Never Smoker  . Smokeless tobacco: Never Used  Substance and Sexual Activity  . Alcohol use: No    Alcohol/week: 0.0 standard drinks  . Drug use: No  . Sexual activity: Not on file  Lifestyle  . Physical activity    Days per week: Not on file    Minutes per session: Not on file  . Stress: Not on file  Relationships  . Social Herbalist on phone: Not on file    Gets together: Not on file    Attends religious service: Not on file    Active member of  club or organization: Not on file    Attends meetings of clubs or organizations: Not on file    Relationship status: Not on file  Other Topics Concern  . Not on file  Social History Narrative  . Not on file    Outpatient Encounter Medications as of 07/25/2019  Medication Sig  . aspirin 81 MG tablet Take 81 mg by mouth daily.  Marland Kitchen atorvastatin (LIPITOR) 40 MG tablet TAKE 1 TABLET DAILY  . ferrous sulfate 325 (65 FE) MG tablet Take 325 mg by mouth daily.   . Lancets (ACCU-CHEK SOFT TOUCH) lancets Use as instructed  . losartan-hydrochlorothiazide (HYZAAR) 100-12.5 MG tablet Take 1 tablet by mouth daily.  . meloxicam (MOBIC) 15 MG tablet Take 15 mg by mouth daily.  . meloxicam (MOBIC) 7.5 MG tablet Take 1 tablet (7.5 mg total) by mouth daily.  . metFORMIN (GLUCOPHAGE) 1000 MG tablet TAKE 1 TABLET TWICE A DAY WITH MEALS  . metoprolol succinate (TOPROL-XL) 25 MG 24 hr tablet TAKE 1 TABLET DAILY  . omeprazole (PRILOSEC) 40 MG  capsule TAKE 1 CAPSULE DAILY  . tamsulosin (FLOMAX) 0.4 MG CAPS capsule Take 1 capsule (0.4 mg total) by mouth daily after supper.   No facility-administered encounter medications on file as of 07/25/2019.    Review of Systems  Constitutional: Negative for appetite change and unexpected weight change.  HENT: Negative for congestion and sinus pressure.   Eyes: Negative for pain and visual disturbance.  Respiratory: Negative for cough, chest tightness and shortness of breath.   Cardiovascular: Negative for chest pain, palpitations and leg swelling.  Gastrointestinal: Negative for abdominal pain, diarrhea, nausea and vomiting.  Genitourinary: Negative for difficulty urinating and dysuria.  Musculoskeletal: Negative for joint swelling and myalgias.  Skin: Negative for color change and rash.  Neurological: Negative for dizziness, light-headedness and headaches.  Hematological: Negative for adenopathy. Does not bruise/bleed easily.  Psychiatric/Behavioral: Negative for  agitation and dysphoric mood.       Objective:    Physical Exam Constitutional:      General: He is not in acute distress.    Appearance: Normal appearance. He is well-developed.  HENT:     Head: Normocephalic and atraumatic.     Right Ear: External ear normal.     Left Ear: External ear normal.  Eyes:     General: No scleral icterus.       Right eye: No discharge.        Left eye: No discharge.     Conjunctiva/sclera: Conjunctivae normal.  Neck:     Musculoskeletal: Neck supple. No muscular tenderness.     Thyroid: No thyromegaly.  Cardiovascular:     Rate and Rhythm: Normal rate and regular rhythm.  Pulmonary:     Effort: No respiratory distress.     Breath sounds: Normal breath sounds. No wheezing.  Abdominal:     General: Bowel sounds are normal.     Palpations: Abdomen is soft.     Tenderness: There is no abdominal tenderness.  Genitourinary:    Comments: Not performed.   Musculoskeletal:        General: No swelling or tenderness.  Lymphadenopathy:     Cervical: No cervical adenopathy.  Skin:    Findings: No erythema or rash.  Neurological:     Mental Status: He is alert and oriented to person, place, and time.  Psychiatric:        Mood and Affect: Mood normal.        Behavior: Behavior normal.     BP 126/70   Pulse 76   Temp 98.2 F (36.8 C)   Resp 16   Ht 5\' 10"  (1.778 m)   Wt 237 lb 9.6 oz (107.8 kg)   SpO2 98%   BMI 34.09 kg/m  Wt Readings from Last 3 Encounters:  07/25/19 237 lb 9.6 oz (107.8 kg)  11/01/18 225 lb 6.4 oz (102.2 kg)  06/23/18 222 lb 3.2 oz (100.8 kg)     Lab Results  Component Value Date   WBC 8.9 08/30/2018   HGB 14.5 08/30/2018   HCT 43.2 08/30/2018   PLT 311.0 08/30/2018   GLUCOSE 117 (H) 07/19/2019   CHOL 114 07/19/2019   TRIG 100.0 07/19/2019   HDL 36.70 (L) 07/19/2019   LDLCALC 58 07/19/2019   ALT 17 07/19/2019   AST 14 07/19/2019   NA 138 07/19/2019   K 3.9 07/19/2019   CL 102 07/19/2019   CREATININE 1.03  07/19/2019   BUN 13 07/19/2019   CO2 30 07/19/2019   TSH 2.21 08/30/2018  PSA 1.36 08/30/2018   HGBA1C 6.8 (H) 07/19/2019   MICROALBUR 1.5 08/30/2018       Assessment & Plan:   Problem List Items Addressed This Visit    Anemia    hgb 08/2018 - wnl.  Follow.        Relevant Orders   CBC with Differential/Platelet   Ferritin   Diabetes mellitus ([facility])    a1c 6.8.  Discussed diet and exercise.  Discussed weight loss.  Discussed the need for regular eye exams.        Relevant Orders   Hemoglobin A1c   Basic metabolic panel   Microalbumin / creatinine urine ratio   GERD (gastroesophageal reflux disease)    Controlled on current regimen.  Follow.        Health care maintenance    Physical today 07/25/19.  Followed by urology for psa.  Colonoscopy 05/2017. Need results.       Hypercholesterolemia    On lipitor.  Low cholesterol diet and exercise.  Follow lipid panel and liver function tests.        Relevant Orders   Hepatic function panel   Lipid panel   Hypertension    Blood pressure under good control.  Continue same medication regimen.  Follow pressures.  Follow metabolic panel.        Relevant Orders   TSH    Other Visit Diagnoses    Prostate cancer screening    -  Primary   Relevant Orders   PSA       Dale Conner, MD

## 2019-07-31 ENCOUNTER — Encounter: Payer: Self-pay | Admitting: Internal Medicine

## 2019-07-31 NOTE — Assessment & Plan Note (Signed)
Blood pressure under good control.  Continue same medication regimen.  Follow pressures.  Follow metabolic panel.   

## 2019-07-31 NOTE — Assessment & Plan Note (Signed)
a1c 6.8.  Discussed diet and exercise.  Discussed weight loss.  Discussed the need for regular eye exams.

## 2019-07-31 NOTE — Assessment & Plan Note (Signed)
Controlled on current regimen.  Follow.  

## 2019-07-31 NOTE — Assessment & Plan Note (Signed)
On lipitor.  Low cholesterol diet and exercise.  Follow lipid panel and liver function tests.   

## 2019-07-31 NOTE — Assessment & Plan Note (Signed)
hgb 08/2018 - wnl.  Follow.

## 2019-08-15 LAB — HM DIABETES EYE EXAM

## 2019-08-18 ENCOUNTER — Encounter: Payer: Self-pay | Admitting: Internal Medicine

## 2019-08-21 ENCOUNTER — Other Ambulatory Visit: Payer: Self-pay | Admitting: Internal Medicine

## 2019-09-21 ENCOUNTER — Other Ambulatory Visit: Payer: Self-pay | Admitting: Internal Medicine

## 2019-11-25 ENCOUNTER — Telehealth: Payer: BC Managed Care – PPO | Admitting: Internal Medicine

## 2019-12-29 ENCOUNTER — Ambulatory Visit (INDEPENDENT_AMBULATORY_CARE_PROVIDER_SITE_OTHER): Payer: BC Managed Care – PPO | Admitting: Internal Medicine

## 2019-12-29 ENCOUNTER — Encounter: Payer: Self-pay | Admitting: Internal Medicine

## 2019-12-29 ENCOUNTER — Other Ambulatory Visit: Payer: Self-pay

## 2019-12-29 DIAGNOSIS — E7800 Pure hypercholesterolemia, unspecified: Secondary | ICD-10-CM | POA: Diagnosis not present

## 2019-12-29 DIAGNOSIS — K219 Gastro-esophageal reflux disease without esophagitis: Secondary | ICD-10-CM

## 2019-12-29 DIAGNOSIS — I10 Essential (primary) hypertension: Secondary | ICD-10-CM | POA: Diagnosis not present

## 2019-12-29 DIAGNOSIS — D649 Anemia, unspecified: Secondary | ICD-10-CM

## 2019-12-29 DIAGNOSIS — E119 Type 2 diabetes mellitus without complications: Secondary | ICD-10-CM

## 2019-12-29 NOTE — Progress Notes (Signed)
Patient ID: Aaron Headland., male   DOB: [birth year], 54 y.o.   MRN: [id number]   Subjective:    Patient ID: Aaron Headland., male    DOB: 23-Mar-1966, 54 y.o.   MRN: [id number]  HPI This visit occurred during the SARS-CoV-2 public health emergency.  Safety protocols were in place, including screening questions prior to the visit, additional usage of staff PPE, and extensive cleaning of exam room while observing appropriate contact time as indicated for disinfecting solutions.  Patient here for a scheduled follow up.  He reports he is doing relatively well.  His wife was just diagnosed with breast cancer.  Handling stress well.  Work is better. Has had some days off recently.  Has gained some weight.  Discussed watching diet.  Discussed exercise. No chest pain.  No sob.  No acid reflux.  No abdominal pain.  Bowels moving.  Discussed low carb diet and exercise.  Discussed covid vaccine.   Past Medical History:  Diagnosis Date  . GERD (gastroesophageal reflux disease)   . Hypercholesterolemia   . Hyperglycemia   . Hypertension   . Iron deficiency anemia    Past Surgical History:  Procedure Laterality Date  . INGUINAL HERNIA REPAIR     Family History  Problem Relation Age of Onset  . Hypertension Mother   . Hypertension Father   . CVA Paternal Grandfather   . Colon cancer Neg Hx   . Prostate cancer Neg Hx    Social History   Socioeconomic History  . Marital status: Married    Spouse name: Not on file  . Number of children: 2  . Years of education: Not on file  . Highest education level: Not on file  Occupational History  . Not on file  Tobacco Use  . Smoking status: Never Smoker  . Smokeless tobacco: Never Used  Substance and Sexual Activity  . Alcohol use: No    Alcohol/week: 0.0 standard drinks  . Drug use: No  . Sexual activity: Not on file  Other Topics Concern  . Not on file  Social History Narrative  . Not on file   Social Determinants of Health   Financial  Resource Strain:   . Difficulty of Paying Living Expenses:   Food Insecurity:   . Worried About Charity fundraiser in the Last Year:   . Arboriculturist in the Last Year:   Transportation Needs:   . Film/video editor (Medical):   Marland Kitchen Lack of Transportation (Non-Medical):   Physical Activity:   . Days of Exercise per Week:   . Minutes of Exercise per Session:   Stress:   . Feeling of Stress :   Social Connections:   . Frequency of Communication with Friends and Family:   . Frequency of Social Gatherings with Friends and Family:   . Attends Religious Services:   . Active Member of Clubs or Organizations:   . Attends Archivist Meetings:   Marland Kitchen Marital Status:     Outpatient Encounter Medications as of 12/29/2019  Medication Sig  . aspirin 81 MG tablet Take 81 mg by mouth daily.  Marland Kitchen atorvastatin (LIPITOR) 40 MG tablet TAKE 1 TABLET DAILY  . ferrous sulfate 325 (65 FE) MG tablet Take 325 mg by mouth daily.   . Lancets (ACCU-CHEK SOFT TOUCH) lancets Use as instructed  . losartan-hydrochlorothiazide (HYZAAR) 100-12.5 MG tablet TAKE 1 TABLET DAILY  . meloxicam (MOBIC) 15 MG tablet Take 15 mg by  mouth daily.  . metFORMIN (GLUCOPHAGE) 1000 MG tablet TAKE 1 TABLET TWICE A DAY WITH MEALS  . metoprolol succinate (TOPROL-XL) 25 MG 24 hr tablet TAKE 1 TABLET DAILY  . omeprazole (PRILOSEC) 40 MG capsule TAKE 1 CAPSULE DAILY  . tamsulosin (FLOMAX) 0.4 MG CAPS capsule TAKE 1 CAPSULE DAILY AFTER SUPPER  . [DISCONTINUED] meloxicam (MOBIC) 7.5 MG tablet Take 1 tablet (7.5 mg total) by mouth daily.   No facility-administered encounter medications on file as of 12/29/2019.    Review of Systems  Constitutional: Negative for appetite change and unexpected weight change.  HENT: Negative for congestion and sinus pressure.   Respiratory: Negative for cough, chest tightness and shortness of breath.   Cardiovascular: Negative for chest pain, palpitations and leg swelling.  Gastrointestinal:  Negative for abdominal pain, diarrhea, nausea and vomiting.  Genitourinary: Negative for dysuria and frequency.  Musculoskeletal: Negative for back pain and myalgias.  Skin: Negative for color change and rash.  Neurological: Negative for dizziness and headaches.  Psychiatric/Behavioral: Negative for agitation and dysphoric mood.      Objective:    Physical Exam Constitutional:      General: He is not in acute distress.    Appearance: Normal appearance. He is well-developed.  HENT:     Head: Normocephalic and atraumatic.     Right Ear: External ear normal.     Left Ear: External ear normal.  Eyes:     General: No scleral icterus.       Right eye: No discharge.        Left eye: No discharge.     Conjunctiva/sclera: Conjunctivae normal.  Cardiovascular:     Rate and Rhythm: Normal rate and regular rhythm.  Pulmonary:     Effort: Pulmonary effort is normal. No respiratory distress.     Breath sounds: Normal breath sounds.  Abdominal:     General: Bowel sounds are normal.     Palpations: Abdomen is soft.     Tenderness: There is no abdominal tenderness.  Musculoskeletal:        General: No swelling or tenderness.  Skin:    Findings: No erythema or rash.  Neurological:     Mental Status: He is alert.  Psychiatric:        Mood and Affect: Mood normal.        Behavior: Behavior normal.     BP 130/78   Pulse 88   Temp 98 F (36.7 C)   Resp 16   Ht 5' 10"  (1.778 m)   Wt 245 lb 3.2 oz (111.2 kg)   SpO2 98%   BMI 35.18 kg/m  Wt Readings from Last 3 Encounters:  12/29/19 245 lb 3.2 oz (111.2 kg)  07/25/19 237 lb 9.6 oz (107.8 kg)  11/01/18 225 lb 6.4 oz (102.2 kg)     Lab Results  Component Value Date   WBC 8.9 08/30/2018   HGB 14.5 08/30/2018   HCT 43.2 08/30/2018   PLT 311.0 08/30/2018   GLUCOSE 117 (H) 07/19/2019   CHOL 114 07/19/2019   TRIG 100.0 07/19/2019   HDL 36.70 (L) 07/19/2019   LDLCALC 58 07/19/2019   ALT 17 07/19/2019   AST 14 07/19/2019   NA  138 07/19/2019   K 3.9 07/19/2019   CL 102 07/19/2019   CREATININE 1.03 07/19/2019   BUN 13 07/19/2019   CO2 30 07/19/2019   TSH 2.21 08/30/2018   PSA 1.36 08/30/2018   HGBA1C 6.8 (H) 07/19/2019   MICROALBUR 1.5 08/30/2018  Assessment & Plan:   Problem List Items Addressed This Visit    Anemia    Follow cbc.       Diabetes mellitus ([place])    Low carb diet and exercise.  Follow met b and a1c.        GERD (gastroesophageal reflux disease)    Controlled on current medication regimen.        Hypercholesterolemia    On lipitor. Low cholesterol diet and exercise.  Follow lipid panel and liver function tests.        Hypertension    Blood pressure as outlined.  Continue current medication regimen - losartan/hctz and metoprolol.  Follow pressures.  Follow metabolic panel.           Einar Pheasant, MD

## 2019-12-31 ENCOUNTER — Other Ambulatory Visit: Payer: Self-pay

## 2019-12-31 ENCOUNTER — Ambulatory Visit: Payer: BC Managed Care – PPO | Attending: Internal Medicine

## 2019-12-31 DIAGNOSIS — Z23 Encounter for immunization: Secondary | ICD-10-CM

## 2019-12-31 NOTE — Progress Notes (Signed)
   [id number]  Name:  Doral Ventrella.    MRN: [id number] DOB: 25-Apr-1966  12/31/2019  Mr. Hy was observed post Covid-19 immunization for 15 minutes without incident. He was provided with Vaccine Information Sheet and instruction to access the V-Safe system.   Mr. Macdonnell was instructed to call 911 with any severe reactions post vaccine: Marland Kitchen Difficulty breathing  . Swelling of face and throat  . A fast heartbeat  . A bad rash all over body  . Dizziness and weakness   Immunizations Administered    Name Date Dose VIS Date Route   Moderna COVID-19 Vaccine 12/31/2019  9:07 AM 0.5 mL 08/30/2019 Intramuscular   Manufacturer: Gala Murdoch   Lot: [id number]   NDC: 16553-748-27

## 2020-01-01 ENCOUNTER — Encounter: Payer: Self-pay | Admitting: Internal Medicine

## 2020-01-01 NOTE — Assessment & Plan Note (Signed)
Controlled on current medication regimen.

## 2020-01-01 NOTE — Assessment & Plan Note (Signed)
On lipitor.  Low cholesterol diet and exercise.  Follow lipid panel and liver function tests.   

## 2020-01-01 NOTE — Assessment & Plan Note (Signed)
Follow cbc.  

## 2020-01-01 NOTE — Assessment & Plan Note (Signed)
Blood pressure as outlined.  Continue current medication regimen - losartan/hctz and metoprolol.  Follow pressures.  Follow metabolic panel.   

## 2020-01-01 NOTE — Assessment & Plan Note (Signed)
Low carb diet and exercise.  Follow met b and a1c.   

## 2020-01-20 ENCOUNTER — Other Ambulatory Visit: Payer: Self-pay | Admitting: Internal Medicine

## 2020-01-25 ENCOUNTER — Ambulatory Visit: Payer: BC Managed Care – PPO | Attending: Internal Medicine

## 2020-01-25 DIAGNOSIS — Z23 Encounter for immunization: Secondary | ICD-10-CM

## 2020-01-25 NOTE — Progress Notes (Signed)
   [id number]  Name:  Crit Obremski.    MRN: [id number] DOB: 18-Nov-1965  01/25/2020  Mr. Keysor was observed post Covid-19 immunization for 15 minutes without incident. He was provided with Vaccine Information Sheet and instruction to access the V-Safe system.   Mr. Grandt was instructed to call 911 with any severe reactions post vaccine: Marland Kitchen Difficulty breathing  . Swelling of face and throat  . A fast heartbeat  . A bad rash all over body  . Dizziness and weakness   Immunizations Administered    Name Date Dose VIS Date Route   Moderna COVID-19 Vaccine 01/25/2020 11:30 AM 0.5 mL 08/2019 Intramuscular   Manufacturer: Moderna   Lot: [id number]   NDC: 84720-721-82

## 2020-02-03 ENCOUNTER — Other Ambulatory Visit: Payer: Self-pay

## 2020-02-03 ENCOUNTER — Other Ambulatory Visit (INDEPENDENT_AMBULATORY_CARE_PROVIDER_SITE_OTHER): Payer: BC Managed Care – PPO

## 2020-02-03 DIAGNOSIS — E7800 Pure hypercholesterolemia, unspecified: Secondary | ICD-10-CM

## 2020-02-03 DIAGNOSIS — Z125 Encounter for screening for malignant neoplasm of prostate: Secondary | ICD-10-CM | POA: Diagnosis not present

## 2020-02-03 DIAGNOSIS — I10 Essential (primary) hypertension: Secondary | ICD-10-CM | POA: Diagnosis not present

## 2020-02-03 DIAGNOSIS — D649 Anemia, unspecified: Secondary | ICD-10-CM | POA: Diagnosis not present

## 2020-02-03 DIAGNOSIS — E119 Type 2 diabetes mellitus without complications: Secondary | ICD-10-CM

## 2020-02-03 LAB — PSA: PSA: 1.72 ng/mL (ref 0.10–4.00)

## 2020-02-03 LAB — MICROALBUMIN / CREATININE URINE RATIO
Creatinine,U: 133.6 mg/dL
Microalb Creat Ratio: 0.5 mg/g (ref 0.0–30.0)
Microalb, Ur: 0.7 mg/dL (ref 0.0–1.9)

## 2020-02-03 LAB — CBC WITH DIFFERENTIAL/PLATELET
Basophils Absolute: 0 10*3/uL (ref 0.0–0.1)
Basophils Relative: 0.6 % (ref 0.0–3.0)
Eosinophils Absolute: 0.3 10*3/uL (ref 0.0–0.7)
Eosinophils Relative: 3.7 % (ref 0.0–5.0)
HCT: 39.7 % (ref 39.0–52.0)
Hemoglobin: 13.9 g/dL (ref 13.0–17.0)
Lymphocytes Relative: 31.7 % (ref 12.0–46.0)
Lymphs Abs: 2.6 10*3/uL (ref 0.7–4.0)
MCHC: 35 g/dL (ref 30.0–36.0)
MCV: 88 fl (ref 78.0–100.0)
Monocytes Absolute: 0.6 10*3/uL (ref 0.1–1.0)
Monocytes Relative: 7.2 % (ref 3.0–12.0)
Neutro Abs: 4.7 10*3/uL (ref 1.4–7.7)
Neutrophils Relative %: 56.8 % (ref 43.0–77.0)
Platelets: 328 10*3/uL (ref 150.0–400.0)
RBC: 4.51 Mil/uL (ref 4.22–5.81)
RDW: 13.3 % (ref 11.5–15.5)
WBC: 8.3 10*3/uL (ref 4.0–10.5)

## 2020-02-03 LAB — BASIC METABOLIC PANEL
BUN: 17 mg/dL (ref 6–23)
CO2: 32 mEq/L (ref 19–32)
Calcium: 9.3 mg/dL (ref 8.4–10.5)
Chloride: 102 mEq/L (ref 96–112)
Creatinine, Ser: 1.02 mg/dL (ref 0.40–1.50)
GFR: 75.89 mL/min (ref >60.00)
Glucose, Bld: 125 mg/dL — ABNORMAL HIGH (ref 70–99)
Potassium: 4.4 mEq/L (ref 3.5–5.1)
Sodium: 138 mEq/L (ref 135–145)

## 2020-02-03 LAB — HEPATIC FUNCTION PANEL
ALT: 17 U/L (ref 0–53)
AST: 15 U/L (ref 0–37)
Albumin: 4 g/dL (ref 3.5–5.2)
Alkaline Phosphatase: 82 U/L (ref 39–117)
Bilirubin, Direct: 0.1 mg/dL (ref 0.0–0.3)
Total Bilirubin: 0.5 mg/dL (ref 0.2–1.2)
Total Protein: 6.9 g/dL (ref 6.0–8.3)

## 2020-02-03 LAB — LIPID PANEL
Cholesterol: 138 mg/dL (ref 0–200)
HDL: 34.9 mg/dL — ABNORMAL LOW (ref >39.00)
LDL Cholesterol: 76 mg/dL (ref 0–99)
NonHDL: 102.9
Total CHOL/HDL Ratio: 4
Triglycerides: 136 mg/dL (ref 0.0–149.0)
VLDL: 27.2 mg/dL (ref 0.0–40.0)

## 2020-02-03 LAB — TSH: TSH: 1.86 u[IU]/mL (ref 0.35–4.50)

## 2020-02-03 LAB — HEMOGLOBIN A1C: Hgb A1c MFr Bld: 7.2 % — ABNORMAL HIGH (ref 4.6–6.5)

## 2020-02-03 LAB — FERRITIN: Ferritin: 99.3 ng/mL (ref 22.0–322.0)

## 2020-02-26 ENCOUNTER — Other Ambulatory Visit: Payer: Self-pay | Admitting: Internal Medicine

## 2020-03-03 ENCOUNTER — Other Ambulatory Visit: Payer: Self-pay | Admitting: Internal Medicine

## 2020-04-19 ENCOUNTER — Ambulatory Visit: Payer: Self-pay | Admitting: Podiatry

## 2020-05-18 ENCOUNTER — Other Ambulatory Visit: Payer: Self-pay

## 2020-05-18 ENCOUNTER — Ambulatory Visit
Admission: EM | Admit: 2020-05-18 | Discharge: 2020-05-18 | Disposition: A | Discharge status: Home / Self Care | Payer: BC Managed Care – PPO | Attending: Family Medicine | Admitting: Family Medicine

## 2020-05-18 DIAGNOSIS — Z20822 Contact with and (suspected) exposure to covid-19: Secondary | ICD-10-CM | POA: Insufficient documentation

## 2020-05-18 LAB — SARS CORONAVIRUS 2 (TAT 6-24 HRS): SARS Coronavirus 2: NEGATIVE

## 2020-05-18 NOTE — ED Triage Notes (Signed)
Pt present to [facility] for Covid testing. States his wife tested positive. He denies symptoms.

## 2020-05-18 NOTE — Discharge Instructions (Signed)

## 2020-05-22 ENCOUNTER — Ambulatory Visit
Admission: EM | Admit: 2020-05-22 | Discharge: 2020-05-22 | Disposition: A | Discharge status: Home / Self Care | Payer: BC Managed Care – PPO | Attending: Family Medicine | Admitting: Family Medicine

## 2020-05-22 ENCOUNTER — Other Ambulatory Visit: Payer: Self-pay

## 2020-05-22 DIAGNOSIS — Z20822 Contact with and (suspected) exposure to covid-19: Secondary | ICD-10-CM | POA: Diagnosis not present

## 2020-05-22 NOTE — ED Triage Notes (Signed)
Patient in today for COVID testing, asymptomatic. Nurse visit only.

## 2020-05-23 LAB — SARS CORONAVIRUS 2 (TAT 6-24 HRS): SARS Coronavirus 2: NEGATIVE

## 2020-05-25 ENCOUNTER — Telehealth (INDEPENDENT_AMBULATORY_CARE_PROVIDER_SITE_OTHER): Payer: BC Managed Care – PPO | Admitting: Internal Medicine

## 2020-05-25 ENCOUNTER — Encounter: Payer: Self-pay | Admitting: Internal Medicine

## 2020-05-25 ENCOUNTER — Ambulatory Visit: Payer: Self-pay | Admitting: Podiatry

## 2020-05-25 DIAGNOSIS — I10 Essential (primary) hypertension: Secondary | ICD-10-CM

## 2020-05-25 DIAGNOSIS — E7800 Pure hypercholesterolemia, unspecified: Secondary | ICD-10-CM

## 2020-05-25 DIAGNOSIS — K219 Gastro-esophageal reflux disease without esophagitis: Secondary | ICD-10-CM | POA: Diagnosis not present

## 2020-05-25 DIAGNOSIS — E1165 Type 2 diabetes mellitus with hyperglycemia: Secondary | ICD-10-CM | POA: Diagnosis not present

## 2020-05-25 DIAGNOSIS — D649 Anemia, unspecified: Secondary | ICD-10-CM

## 2020-05-25 NOTE — Progress Notes (Signed)
Patient ID: Aaron Allison., male   DOB: 16-May-1966, 54 y.o.   MRN: [id number]   Virtual Visit via video Note  This visit type was conducted due to national recommendations for restrictions regarding the COVID-19 pandemic (e.g. social distancing).  This format is felt to be most appropriate for this patient at this time.  All issues noted in this document were discussed and addressed.  No physical exam was performed (except for noted visual exam findings with Video Visits).   I connected with Aaron Allison by a video enabled telemedicine application and verified that I am speaking with the correct person using two identifiers. Location patient: home Location provider: work  Persons participating in the virtual visit: patient, provider  The limitations, risks, security and privacy concerns of performing an evaluation and management service by video and the availability of in person appointments have been discussed. It has also been discussed with the patient that there may be a patient responsible charge related to this service. The patient expressed understanding and agreed to proceed.   Reason for visit: scheduled follow up.    HPI: He reports he is doing relatively well.  Not having to work as much overtime right now.  Wife sick.  Diagnosed with covid.  He has tested negative and having no symptoms.  No chest pain or sob.  No acid reflux.  No abdominal pain.  Bowels moving.  Discussed diet and exercise.  States am sugars 120.  PM sugars 15-140-160s.  Blood pressures ranging 120s-130s/70-80.  Handling stress.    ROS: See pertinent positives and negatives per HPI.  Past Medical History:  Diagnosis Date  . GERD (gastroesophageal reflux disease)   . Hypercholesterolemia   . Hyperglycemia   . Hypertension   . Iron deficiency anemia     Past Surgical History:  Procedure Laterality Date  . INGUINAL HERNIA REPAIR      Family History  Problem Relation Age of Onset  . Hypertension Mother    . Hypertension Father   . CVA Paternal Grandfather   . Colon cancer Neg Hx   . Prostate cancer Neg Hx     SOCIAL HX: reviewed.    Current Outpatient Medications:  .  aspirin 81 MG tablet, Take 81 mg by mouth daily., Disp: , Rfl:  .  atorvastatin (LIPITOR) 40 MG tablet, TAKE 1 TABLET DAILY, Disp: 90 tablet, Rfl: 3 .  ferrous sulfate 325 (65 FE) MG tablet, Take 325 mg by mouth daily. , Disp: , Rfl:  .  Lancets (ACCU-CHEK SOFT TOUCH) lancets, Use as instructed, Disp: 100 each, Rfl: 5 .  losartan-hydrochlorothiazide (HYZAAR) 100-12.5 MG tablet, TAKE 1 TABLET DAILY, Disp: 90 tablet, Rfl: 3 .  meloxicam (MOBIC) 15 MG tablet, Take 15 mg by mouth daily., Disp: , Rfl:  .  metFORMIN (GLUCOPHAGE) 1000 MG tablet, TAKE 1 TABLET TWICE A DAY WITH MEALS, Disp: 180 tablet, Rfl: 3 .  metoprolol succinate (TOPROL-XL) 25 MG 24 hr tablet, TAKE 1 TABLET DAILY, Disp: 90 tablet, Rfl: 3 .  omeprazole (PRILOSEC) 40 MG capsule, TAKE 1 CAPSULE DAILY, Disp: 90 capsule, Rfl: 3 .  tamsulosin (FLOMAX) 0.4 MG CAPS capsule, TAKE 1 CAPSULE DAILY AFTER SUPPER, Disp: 90 capsule, Rfl: 3  EXAM:  GENERAL: alert, oriented, appears well and in no acute distress  HEENT: atraumatic, conjunttiva clear, no obvious abnormalities on inspection of external nose and ears  NECK: normal movements of the head and neck  LUNGS: on inspection no signs of respiratory distress, breathing  rate appears normal, no obvious gross SOB, gasping or wheezing  CV: no obvious cyanosis  PSYCH/NEURO: pleasant and cooperative, no obvious depression or anxiety, speech and thought processing grossly intact  ASSESSMENT AND PLAN:  Discussed the following assessment and plan:  Hypertension Blood pressure as outlined.  Continue current medication regimen - losartan/hctz and metoprolol.  Follow pressures.  Follow metabolic panel.    Hypercholesterolemia On lipitor.  Low cholesterol diet and exercise.  Follow lipid panel and liver function tests.     GERD (gastroesophageal reflux disease) No upper symptoms reported.  On omeprazole.    Diabetes mellitus Low carb diet and exercise.  Follow met b and a1c. Sugars as outlined.    Anemia Follow cbc.     Orders Placed This Encounter  Procedures  . Hemoglobin A1c    Standing Status:   Future    Standing Expiration Date:   06/03/2021  . Hepatic function panel    Standing Status:   Future    Standing Expiration Date:   06/03/2021  . Lipid panel    Standing Status:   Future    Standing Expiration Date:   06/03/2021  . Basic metabolic panel    Standing Status:   Future    Standing Expiration Date:   06/03/2021     I discussed the assessment and treatment plan with the patient. The patient was provided an opportunity to ask questions and all were answered. The patient agreed with the plan and demonstrated an understanding of the instructions.   The patient was advised to call back or seek an in-person evaluation if the symptoms worsen or if the condition fails to improve as anticipated.   Einar Pheasant, MD

## 2020-06-03 NOTE — Assessment & Plan Note (Signed)
No upper symptoms reported.  On omeprazole.  

## 2020-06-03 NOTE — Assessment & Plan Note (Signed)
Low carb diet and exercise.  Follow met b and a1c. Sugars as outlined.   

## 2020-06-03 NOTE — Assessment & Plan Note (Signed)
On lipitor.  Low cholesterol diet and exercise.  Follow lipid panel and liver function tests.   

## 2020-06-03 NOTE — Assessment & Plan Note (Signed)
Follow cbc.  

## 2020-06-03 NOTE — Assessment & Plan Note (Signed)
Blood pressure as outlined.  Continue current medication regimen - losartan/hctz and metoprolol.  Follow pressures.  Follow metabolic panel.

## 2020-06-22 ENCOUNTER — Other Ambulatory Visit (INDEPENDENT_AMBULATORY_CARE_PROVIDER_SITE_OTHER): Payer: BC Managed Care – PPO

## 2020-06-22 ENCOUNTER — Other Ambulatory Visit: Payer: Self-pay

## 2020-06-22 DIAGNOSIS — I10 Essential (primary) hypertension: Secondary | ICD-10-CM | POA: Diagnosis not present

## 2020-06-22 DIAGNOSIS — E7800 Pure hypercholesterolemia, unspecified: Secondary | ICD-10-CM

## 2020-06-22 DIAGNOSIS — E1165 Type 2 diabetes mellitus with hyperglycemia: Secondary | ICD-10-CM

## 2020-06-22 LAB — BASIC METABOLIC PANEL
BUN: 15 mg/dL (ref 6–23)
CO2: 29 mEq/L (ref 19–32)
Calcium: 9.3 mg/dL (ref 8.4–10.5)
Chloride: 101 mEq/L (ref 96–112)
Creatinine, Ser: 1 mg/dL (ref 0.40–1.50)
GFR: 77.54 mL/min (ref >60.00)
Glucose, Bld: 119 mg/dL — ABNORMAL HIGH (ref 70–99)
Potassium: 4.1 mEq/L (ref 3.5–5.1)
Sodium: 139 mEq/L (ref 135–145)

## 2020-06-22 LAB — LIPID PANEL
Cholesterol: 127 mg/dL (ref 0–200)
HDL: 36.5 mg/dL — ABNORMAL LOW (ref >39.00)
LDL Cholesterol: 64 mg/dL (ref 0–99)
NonHDL: 90.61
Total CHOL/HDL Ratio: 3
Triglycerides: 131 mg/dL (ref 0.0–149.0)
VLDL: 26.2 mg/dL (ref 0.0–40.0)

## 2020-06-22 LAB — HEPATIC FUNCTION PANEL
ALT: 15 U/L (ref 0–53)
AST: 12 U/L (ref 0–37)
Albumin: 3.9 g/dL (ref 3.5–5.2)
Alkaline Phosphatase: 66 U/L (ref 39–117)
Bilirubin, Direct: 0.1 mg/dL (ref 0.0–0.3)
Total Bilirubin: 0.6 mg/dL (ref 0.2–1.2)
Total Protein: 6.6 g/dL (ref 6.0–8.3)

## 2020-06-22 LAB — HEMOGLOBIN A1C: Hgb A1c MFr Bld: 7.1 % — ABNORMAL HIGH (ref 4.6–6.5)

## 2020-06-28 ENCOUNTER — Telehealth: Payer: Self-pay

## 2020-06-28 ENCOUNTER — Other Ambulatory Visit: Payer: Self-pay | Admitting: Internal Medicine

## 2020-06-28 DIAGNOSIS — E1165 Type 2 diabetes mellitus with hyperglycemia: Secondary | ICD-10-CM

## 2020-06-28 NOTE — Progress Notes (Signed)
Order placed for CCM referral.  

## 2020-06-28 NOTE — Chronic Care Management (AMB) (Signed)
  Care Management   Outreach Note  06/28/2020 Name: Oneal Schoenberger. MRN: [id number] DOB: [birth year]  Referred by: Dale Galena, MD Reason for referral : Care Coordination (outreach to schedule referral for Pharm D )   An unsuccessful telephone outreach was attempted today. The patient was referred to the case management team for assistance with care management and care coordination.   Follow Up Plan: A HIPAA compliant phone message was left for the patient providing contact information and requesting a return call.  The care management team will reach out to the patient again over the next 7 days.  If patient returns call to provider office, please advise to call Embedded Care Management Care Guide Penne Lash at [phone]  Penne Lash, RMA Care Guide, Embedded Care Coordination [facility]  [place], [place] [address] Direct Dial: [phone] [email or web address]

## 2020-07-03 ENCOUNTER — Other Ambulatory Visit: Payer: Self-pay | Admitting: Internal Medicine

## 2020-07-06 ENCOUNTER — Ambulatory Visit (INDEPENDENT_AMBULATORY_CARE_PROVIDER_SITE_OTHER): Payer: BC Managed Care – PPO | Admitting: Podiatry

## 2020-07-06 ENCOUNTER — Encounter: Payer: Self-pay | Admitting: Podiatry

## 2020-07-06 ENCOUNTER — Ambulatory Visit (INDEPENDENT_AMBULATORY_CARE_PROVIDER_SITE_OTHER): Payer: BC Managed Care – PPO

## 2020-07-06 ENCOUNTER — Other Ambulatory Visit: Payer: Self-pay

## 2020-07-06 ENCOUNTER — Other Ambulatory Visit: Payer: Self-pay | Admitting: Podiatry

## 2020-07-06 DIAGNOSIS — M722 Plantar fascial fibromatosis: Secondary | ICD-10-CM

## 2020-07-06 DIAGNOSIS — M779 Enthesopathy, unspecified: Secondary | ICD-10-CM

## 2020-07-06 MED ORDER — MELOXICAM 15 MG PO TABS: 15.0000 mg | ORAL_TABLET | Freq: Every day | ORAL | 1 refills | Status: DC

## 2020-07-06 MED ORDER — METHYLPREDNISOLONE 4 MG PO TBPK: ORAL_TABLET | ORAL | 0 refills | Status: DC

## 2020-07-06 NOTE — Chronic Care Management (AMB) (Signed)
  Care Management   Note  07/06/2020 Name: Aaron Allison. MRN: [id number] DOB: [birth year]  Aaron Allison. is a 54 y.o. year old male who is a primary care patient of Dale Mattydale, MD. I reached out to Aaron Allison. by phone today in response to a referral sent by Mr. RAHEIM BEUTLER Jr.'s Dr. Lorin Picket.    [name] was given information about care management services today including:  1. Care management services include personalized support from designated clinical staff supervised by his physician, including individualized plan of care and coordination with other care providers 2. 24/7 contact phone numbers for assistance for urgent and routine care needs. 3. The patient may stop care management services at any time by phone call to the office staff.  Patient agreed to services and verbal consent obtained.   Follow up plan: Telephone appointment with care management team member scheduled for: Pharm D 08/08/2020  Penne Lash, RMA Care Guide, Embedded Care Coordination [facility]  [place], [place] [address] Direct Dial: [phone] [email or web address] Website: [email or web address]

## 2020-07-06 NOTE — Progress Notes (Signed)
   Subjective: 54 y.o. male presenting as a new patient for evaluation of left arch pain.  This is been going on for the past few years.  He works Physiological scientist at [employer].  He experiences pain throughout the day.  He is only taken Tylenol as needed for treatment.  He presents for further treatment and evaluation   Past Medical History:  Diagnosis Date  . GERD (gastroesophageal reflux disease)   . Hypercholesterolemia   . Hyperglycemia   . Hypertension   . Iron deficiency anemia      Objective: Physical Exam General: The patient is alert and oriented x3 in no acute distress.  Dermatology: Skin is warm, dry and supple bilateral lower extremities. Negative for open lesions or macerations bilateral.   Vascular: Dorsalis Pedis and Posterior Tibial pulses palpable bilateral.  Capillary fill time is immediate to all digits.  Neurological: Epicritic and protective threshold intact bilateral.   Musculoskeletal: Tenderness to palpation to the plantar aspect of the left heel along the plantar fascia. All other joints range of motion within normal limits bilateral. Strength 5/5 in all groups bilateral.   Radiographic exam: Normal osseous mineralization. Joint spaces preserved. No fracture/dislocation/boney destruction. No other soft tissue abnormalities or radiopaque foreign bodies.   Assessment: 1. Plantar fasciitis left foot-mid substance  Plan of Care:  1. Patient evaluated. Xrays reviewed.   2. Injection of 0.5cc Celestone soluspan injected into the left plantar fascia.  3. Rx for Medrol Dose Pak placed 4. Rx for Meloxicam ordered for patient. 5.  Appointment with Pedorthist for custom molded orthotics 6. Instructed patient regarding therapies and modalities at home to alleviate symptoms.  7. Return to clinic in 4 weeks.    *Works at [employer] for 23 yrs. wife has worked there for 34 yrs.    Felecia Shelling, DPM [facility]  Dr. Felecia Shelling, DPM     [address] [place], [place] [address]                Office [phone]  Fax [phone]

## 2020-07-17 ENCOUNTER — Encounter: Payer: Self-pay | Admitting: Internal Medicine

## 2020-08-08 ENCOUNTER — Ambulatory Visit: Payer: BC Managed Care – PPO | Admitting: Pharmacist

## 2020-08-08 DIAGNOSIS — E1165 Type 2 diabetes mellitus with hyperglycemia: Secondary | ICD-10-CM

## 2020-08-08 DIAGNOSIS — I10 Essential (primary) hypertension: Secondary | ICD-10-CM

## 2020-08-08 DIAGNOSIS — E7800 Pure hypercholesterolemia, unspecified: Secondary | ICD-10-CM

## 2020-08-08 NOTE — Patient Instructions (Signed)
Aaron Allison,   It was great talking with you today!  See the enclosed handout that we discussed. You could make a HUGE impact on your blood sugars by focusing on reducing that soda/sweet tea consumption and cutting back on snack foods. Let me know if you think a referral to meet with a nutritionist would be something you would be interested in.   Our goal A1c is less than 7%. This corresponds with fasting sugars less than 130 and 2 hour after meal sugars less than 180. Please check your blood sugar a few days a week, alternating between these times of the day. This is how we can evaluate the impact of the changes you plan to make  Feel free to call me with any questions or concerns. I look forward to our next visit!  Catie Feliz Beam, PharmD, [place], CPP Direct line: [phone] Clinic phone: [phone]  Visit Information  Patient Care Plan: Diabetes Type 2 (Adult)    Problem Identified: Disease Progression Prevention     Long-Range Goal: Disease Progression Prevented or Minimized   Start Date: 08/08/2020  This Visit's Progress: On track  Priority: High  Note:   Objective:   Diabetes: uncontrolled; metformin 1000 mg BID. o Current meal patterns: Breakfast: soda/cheerwine; frozen croissant meal, pack of crackers; Lunch: leftovers from night before; sweet tea; Supper: chicken, beanie weenies, hot dogs; does not like many vegetables; Snacks: cookies, cakes; Drinks: soda, sweet tea o Current exercise: none; active job in Therapist, occupational; caring for multiple yards in his family o Current glucose checks: not checking right now  Hypertension: controlled at last visit; losartan/HCTZ 100/12.5 mg QAM, metoprolol succinate 25 mg daily  Hyperlipidemia: LDL at goal, atorvastatin 40 mg daily  ASCVD Risk Reduction: aspirin 81 mg daily  Acid Reflux: omeprazole 40 mg daily  BPH: tamsulosin 0.4 mg daily   Current Barriers:   Unable to independently reach goal glycemic control  Pharmacist  Clinical Goal(s):   Over the next 90 days, patient will work with PharmD and primary care provider to address optimized glycemic managemnet  Interventions:  Comprehensive medication review performed, medication list updated in electronic medical record  Inter-disciplinary care team collaboration (see longitudinal plan of care)  Extensive dietary discussion. Reviewed negative impact of sodas, snacks, carbohydrates. Discussed focusing on dietary interventions for 3 months. Patient chooses to do this over adding medication at this time. Providing w/ Healthy Meal Planning handouts.   Patient Goals/Self-Care Activities  Over the next 90 days, patient will:  - check blood glucose BID document, and provide at future appointments - focus on reduction in sugary beverages, carbohydrates, and snacks - take medications as prescribed - report any questions or concerns to provider   Follow Up Plan: Telephone follow up appointment with care management team member scheduled for: ~6 weeks        Goals Addressed            This Visit's Progress    Monitor and Manage My Blood Sugar       Follow Up Date 08/2021 Patient Goals/Self-Care Activities  Over the next 90 days, patient will:  - check blood glucose BID document, and provide at future appointments - focus on reduction in sugary beverages, carbohydrates, and snacks - take medications as prescribed - report any questions or concerns to provider           The patient verbalized understanding of instructions provided today and agreed to receive a mailed copy of patient instruction and/or educational materials.  Follow up call scheduled in ~ 6 weeks  Catie Feliz Beam, PharmD, [place], CPP Clinical Pharmacist [facility] [employer] [phone]

## 2020-08-08 NOTE — Chronic Care Management (AMB) (Signed)
Chronic Care Management   Note  08/08/2020 Name: Aaron Allison. MRN: [id number] DOB: Jul 09, 1966   Subjective:  Aaron Allison. is a 54 y.o. year old male who is a primary care patient of Einar Pheasant, MD. The CCM team was consulted for assistance with chronic disease management and care coordination needs.    Connected with patient by telephone for initial in response to provider referral for pharmacy case management and/or care coordination services.   Mr. Fearnow was given information about Chronic Care Management services today including:  1. CCM service includes personalized support from designated clinical staff supervised by his physician, including individualized plan of care and coordination with other care providers 2. 24/7 contact phone numbers for assistance for urgent and routine care needs. 3. The patient may stop CCM services at any time (effective at the end of the month) by phone call to the office staff. 4. The patient will be responsible for cost sharing (co-pay) of up to 20% of the service fee (after annual deductible is met).  Patient agreed to services and verbal consent obtained.   Review of patient status, including review of consultants reports, laboratory and other test data, was performed as part of comprehensive evaluation and provision of chronic care management services.   SDOH (Social Determinants of Health) assessments and interventions performed:  SDOH Interventions     Most Recent Value  SDOH Interventions  Financial Strain Interventions Intervention Not Indicated  Physical Activity Interventions Other (Comments)  [encouraged increased exercise]       Objective:  Lab Results  Component Value Date   CREATININE 1.00 06/22/2020   CREATININE 1.02 02/03/2020   CREATININE 1.03 07/19/2019    Lab Results  Component Value Date   HGBA1C 7.1 (H) 06/22/2020       Component Value Date/Time   CHOL 127 06/22/2020 0822   TRIG 131.0 06/22/2020  0822   HDL 36.50 (L) 06/22/2020 0822   CHOLHDL 3 06/22/2020 0822   VLDL 26.2 06/22/2020 0822   LDLCALC 64 06/22/2020 0822     Clinical ASCVD: No  The ASCVD Risk score Mikey Bussing DC Jr., et al., 2013) failed to calculate for the following reasons:   The valid total cholesterol range is 130 to 320 mg/dL     BP Readings from Last 3 Encounters:  05/25/20 133/83  05/22/20 (!) 151/95  12/29/19 130/78    Assessment:   No Known Allergies  Medications Reviewed Today    Reviewed by De Hollingshead, RPH-CPP (Pharmacist) on 08/08/20 at 1541  Med List Status: <None>  Medication Order Taking? Sig Documenting Provider Last Dose Status Informant  aspirin 81 MG tablet [id number] Yes Take 81 mg by mouth daily. [provider] Taking Active   atorvastatin (LIPITOR) 40 MG tablet [id number] Yes TAKE 1 TABLET DAILY Einar Pheasant, MD Taking Active   Lancets (ACCU-CHEK SOFT TOUCH) lancets [id number] Yes Use as instructed Einar Pheasant, MD Taking Active   losartan-hydrochlorothiazide [facility]) 100-12.5 MG tablet [id number] Yes TAKE 1 TABLET DAILY Einar Pheasant, MD Taking Active   meloxicam (MOBIC) 15 MG tablet [id number] Yes Take 1 tablet (15 mg total) by mouth daily. Edrick Kins, DPM Taking Active   metFORMIN (GLUCOPHAGE) 1000 MG tablet [id number] Yes TAKE 1 TABLET TWICE A DAY WITH MEALS Einar Pheasant, MD Taking Active   metoprolol succinate (TOPROL-XL) 25 MG 24 hr tablet [id number] Yes TAKE 1 TABLET DAILY Einar Pheasant, MD Taking Active   omeprazole (PRILOSEC) 40 MG capsule [id number] Yes TAKE 1  CAPSULE DAILY Scott, Charlene, MD Taking Active   tamsulosin (FLOMAX) 0.4 MG CAPS capsule [id number] Yes TAKE 1 CAPSULE DAILY AFTER SUPPER Scott, Charlene, MD Taking Active           Patient Care Plan: Diabetes Type 2 (Adult)    Problem Identified: Disease Progression Prevention     Long-Range Goal: Disease Progression Prevented or Minimized   Start Date: 08/08/2020  This Visit's Progress: On  track  Priority: High  Note:   Objective:  . Diabetes: uncontrolled; metformin 1000 mg BID. o Current meal patterns: Breakfast: soda/cheerwine; frozen croissant meal, pack of crackers; Lunch: leftovers from night before; sweet tea; Supper: chicken, beanie weenies, hot dogs; does not like many vegetables; Snacks: cookies, cakes; Drinks: soda, sweet tea o Current exercise: none; active job in machine factory; caring for multiple yards in his family o Current glucose checks: not checking right now . Hypertension: controlled at last visit; losartan/HCTZ 100/12.5 mg QAM, metoprolol succinate 25 mg daily . Hyperlipidemia: LDL at goal, atorvastatin 40 mg daily . ASCVD Risk Reduction: aspirin 81 mg daily . Acid Reflux: omeprazole 40 mg daily . BPH: tamsulosin 0.4 mg daily   Current Barriers:  . Unable to independently reach goal glycemic control  Pharmacist Clinical Goal(s):  . Over the next 90 days, patient will work with PharmD and primary care provider to address optimized glycemic managemnet  Interventions: . Comprehensive medication review performed, medication list updated in electronic medical record . Inter-disciplinary care team collaboration (see longitudinal plan of care) . Extensive dietary discussion. Reviewed negative impact of sodas, snacks, carbohydrates. Discussed focusing on dietary interventions for 3 months. Patient chooses to do this over adding medication at this time. Providing w/ Healthy Meal Planning handouts.   Patient Goals/Self-Care Activities . Over the next 90 days, patient will:  - check blood glucose BID document, and provide at future appointments - focus on reduction in sugary beverages, carbohydrates, and snacks - take medications as prescribed - report any questions or concerns to provider   Follow Up Plan: Telephone follow up appointment with care management team member scheduled for: ~6 weeks       Goals Addressed            This Visit's  Progress   . Monitor and Manage My Blood Sugar       Follow Up Date 08/2021 Patient Goals/Self-Care Activities . Over the next 90 days, patient will:  - check blood glucose BID document, and provide at future appointments - focus on reduction in sugary beverages, carbohydrates, and snacks - take medications as prescribed - report any questions or concerns to provider       Catie Travis, PharmD, BCACP, CPP Clinical Pharmacist [facility] [phone]   

## 2020-08-15 ENCOUNTER — Other Ambulatory Visit: Payer: Self-pay

## 2020-08-15 ENCOUNTER — Ambulatory Visit (INDEPENDENT_AMBULATORY_CARE_PROVIDER_SITE_OTHER): Payer: BC Managed Care – PPO | Admitting: Orthotics

## 2020-08-15 DIAGNOSIS — M722 Plantar fascial fibromatosis: Secondary | ICD-10-CM | POA: Diagnosis not present

## 2020-08-15 LAB — HM DIABETES EYE EXAM

## 2020-08-15 NOTE — Progress Notes (Signed)

## 2020-08-16 ENCOUNTER — Other Ambulatory Visit: Payer: Self-pay | Admitting: Internal Medicine

## 2020-08-28 ENCOUNTER — Other Ambulatory Visit: Payer: Self-pay | Admitting: Internal Medicine

## 2020-08-31 ENCOUNTER — Ambulatory Visit: Payer: BC Managed Care – PPO | Admitting: Orthotics

## 2020-08-31 DIAGNOSIS — M722 Plantar fascial fibromatosis: Secondary | ICD-10-CM

## 2020-08-31 NOTE — Progress Notes (Signed)
F/O not here; coming in next weekk advised patient of breakin period, etc;  He will p/up in [place]

## 2020-09-17 ENCOUNTER — Ambulatory Visit: Payer: BC Managed Care – PPO | Admitting: Pharmacist

## 2020-09-17 DIAGNOSIS — E7800 Pure hypercholesterolemia, unspecified: Secondary | ICD-10-CM

## 2020-09-17 DIAGNOSIS — I10 Essential (primary) hypertension: Secondary | ICD-10-CM

## 2020-09-17 DIAGNOSIS — E1165 Type 2 diabetes mellitus with hyperglycemia: Secondary | ICD-10-CM

## 2020-09-17 NOTE — Chronic Care Management (AMB) (Signed)
Care Management   Pharmacy Note  09/17/2020 Name: Aaron Allison. MRN: [id number] DOB: [birth year]  Aaron Allison. is a 54 y.o. year old male who is a primary care patient of Dale Dundas, MD. The Care Management/Care Coordination team team was consulted for assistance with care management and care coordination needs.    Engaged with patient by telephone for follow up visit in response to provider referral for pharmacy case management and/or care coordination services.   Consent to Services:  Aaron Allison was given information about care management/care coordination services, agreed to services, and gave verbal consent prior to initiation of services. Please see initial visit note for detailed documentation.   Review of patient status, including review of consultants reports, laboratory and other test data, was performed as part of comprehensive evaluation and provision of chronic care management services.   SDOH (Social Determinants of Health) assessments and interventions performed:  none today  Objective:  Lab Results  Component Value Date   CREATININE 1.00 06/22/2020   CREATININE 1.02 02/03/2020   CREATININE 1.03 07/19/2019    Lab Results  Component Value Date   HGBA1C 7.1 (H) 06/22/2020       Component Value Date/Time   CHOL 127 06/22/2020 0822   TRIG 131.0 06/22/2020 0822   HDL 36.50 (L) 06/22/2020 0822   CHOLHDL 3 06/22/2020 0822   VLDL 26.2 06/22/2020 0822   LDLCALC 64 06/22/2020 0822     Clinical ASCVD: No  The ASCVD Risk score Denman George DC Jr., et al., 2013) failed to calculate for the following reasons:   The valid total cholesterol range is 130 to 320 mg/dL      BP Readings from Last 3 Encounters:  05/25/20 133/83  05/22/20 (!) 151/95  12/29/19 130/78    Care Plan  No Known Allergies  Medications Reviewed Today    Reviewed by Aaron Allison, RPH-CPP (Pharmacist) on 09/17/20 at 1554  Med List Status: <None>  Medication Order Taking? Sig  Documenting Provider Last Dose Status Informant  aspirin 81 MG tablet [id number] Yes Take 81 mg by mouth daily. [provider] Taking Active   atorvastatin (LIPITOR) 40 MG tablet [id number] Yes TAKE 1 TABLET DAILY Dale [place], MD Taking Active   Lancets (ACCU-CHEK SOFT TOUCH) lancets [id number] Yes Use as instructed Dale [place], MD Taking Active   losartan-hydrochlorothiazide [facility]) 100-12.5 MG tablet [id number] Yes TAKE 1 TABLET DAILY Dale [place], MD Taking Active   meloxicam (MOBIC) 15 MG tablet [id number] No Take 1 tablet (15 mg total) by mouth daily.  Patient not taking: Reported on 09/17/2020   Felecia Shelling, DPM Not Taking Active   metFORMIN (GLUCOPHAGE) 1000 MG tablet [id number] Yes TAKE 1 TABLET TWICE A DAY WITH MEALS Dale [place], MD Taking Active   metoprolol succinate (TOPROL-XL) 25 MG 24 hr tablet [id number] Yes TAKE 1 TABLET DAILY Dale [place], MD Taking Active   omeprazole (PRILOSEC) 40 MG capsule [id number] Yes TAKE 1 CAPSULE DAILY Dale [place], MD Taking Active   tamsulosin (FLOMAX) 0.4 MG CAPS capsule [id number] Yes TAKE 1 CAPSULE DAILY AFTER SUPPER Sherlene Shams, MD Taking Active           Patient Active Problem List   Diagnosis Date Noted   Epistaxis 03/27/2019   Benign localized hyperplasia of prostate with urinary obstruction 01/01/2018   Incomplete emptying of bladder 01/01/2018   Health care maintenance 01/01/2015   GERD (gastroesophageal reflux disease) 01/01/2015   Enlarged prostate 02/26/2014   Diabetes mellitus (HCC)  10/08/2012   Hypertension 10/08/2012   Hypercholesterolemia 10/08/2012   Anemia 10/08/2012   Elevated prostate specific antigen (PSA) 06/01/2012    Conditions to be addressed/monitored per PCP order: HTN, HLD and DMII  Patient Care Plan: Medication Management    Problem Identified: Diabetes, Hypertension, HLD     Long-Range Goal: Disease Progression Prevented or Minimized   Start Date: 08/08/2020   Recent Progress: On track  Priority: High  Note:   Current Barriers:   Unable to achieve control of diabetes   Pharmacist Clinical Goal(s):   Over the next 90 days, patient will achieve control of diabetes as evidenced by A1c  through collaboration with PharmD and provider.   Interventions:  1:1 collaboration with Dale [place], MD regarding development and update of comprehensive plan of care as evidenced by provider attestation and co-signature  Inter-disciplinary care team collaboration (see longitudinal plan of care)  Comprehensive medication review performed; medication list updated in electronic medical record  Diabetes:  Uncontrolled but improved per SMBG; current treatment: metformin 1000 mg BID   Endorses an 11 lb weight loss since we last talked  Current glucose readings:fasting: 110-140, mostly <130; before super: 100-120; after supper: 107-110  Current meal patterns: Previously was drinking mostly soda, but now is drinking mostly water and occasional Cheerwine; breakfast: bowel of cereal, sometimes nuts; lunch: salad; supper: meat + vegetables; Snacks: cut back significantly, though an occasional oatmeal cookie.   Current exercise: active job in [employer], active hobbies at home in the yard  Praised for weight loss, monitoring of glucose readings. Encouraged continued adherence to diet changes and medications.   F/u with PCP scheduled in Jan. Will collaborate w/ CMA to schedule for lab appt and lab work  Hypertension:  Controlled; current treatment: losartan/HCTZ 100/12.5 mg QAM, metoprolol succinate 25 mg daily  Current home readings: 110-120s/70-80s  Reviewed goal BP <130/80. Encouraged continued medication adherence and monitoring  Hyperlipidemia and ASCVD risk reduction:  Controlled; current treatment: atorvastatin 40 mg daily  Antiplatelet regimen: aspirin 81 mg daily  Recommended to continue current regimen at this time  Patient Goals/Self-Care  Activities  Over the next 90 days, patient will: - take medications as prescribed check glucose BID, document, and provide at future appointments check blood pressure daily, document, and provide at future appointments  Follow Up Plan: Telephone follow up appointment with care management team member scheduled for: ~ 8 weeks     Medication Assistance: None required. Patient affirms current coverage meets needs.   Plan: Telephone follow up appointment with care management team member scheduled for: ~ 8 weeks  Catie Feliz Beam, PharmD, [place], CPP Clinical Pharmacist [facility] [employer] [phone]

## 2020-09-17 NOTE — Patient Instructions (Signed)
Visit Information  Patient Care Plan: Medication Management    Problem Identified: Diabetes, Hypertension, HLD     Long-Range Goal: Disease Progression Prevented or Minimized   Start Date: 08/08/2020  Recent Progress: On track  Priority: High  Note:   Current Barriers:  . Unable to achieve control of diabetes   Pharmacist Clinical Goal(s):  Marland Kitchen Over the next 90 days, patient will achieve control of diabetes as evidenced by A1c  through collaboration with PharmD and provider.   Interventions: . 1:1 collaboration with Dale [place], MD regarding development and update of comprehensive plan of care as evidenced by provider attestation and co-signature . Inter-disciplinary care team collaboration (see longitudinal plan of care) . Comprehensive medication review performed; medication list updated in electronic medical record  Diabetes: . Uncontrolled but improved per SMBG; current treatment: metformin 1000 mg BID  . Endorses an 11 lb weight loss since we last talked . Current glucose readings:fasting: 110-140, mostly <130; before super: 100-120; after supper: 107-110 . Current meal patterns: Previously was drinking mostly soda, but now is drinking mostly water and occasional Cheerwine; breakfast: bowel of cereal, sometimes nuts; lunch: salad; supper: meat + vegetables; Snacks: cut back significantly, though an occasional oatmeal cookie.  . Current exercise: active job in [employer], active hobbies at home in the yard . Praised for weight loss, monitoring of glucose readings. Encouraged continued adherence to diet changes and medications.  . F/u with PCP scheduled in Jan. Will collaborate w/ CMA to schedule for lab appt and lab work  Hypertension: . Controlled; current treatment: losartan/HCTZ 100/12.5 mg QAM, metoprolol succinate 25 mg daily . Current home readings: 110-120s/70-80s . Reviewed goal BP <130/80. Encouraged continued medication adherence and monitoring  Hyperlipidemia and  ASCVD risk reduction: . Controlled; current treatment: atorvastatin 40 mg daily . Antiplatelet regimen: aspirin 81 mg daily . Recommended to continue current regimen at this time  Patient Goals/Self-Care Activities . Over the next 90 days, patient will: - take medications as prescribed check glucose BID, document, and provide at future appointments check blood pressure daily, document, and provide at future appointments  Follow Up Plan: Telephone follow up appointment with care management team member scheduled for: ~ 8 weeks      The patient verbalized understanding of instructions, educational materials, and care plan provided today and declined offer to receive copy of patient instructions, educational materials, and care plan.  Plan: Telephone follow up appointment with care management team member scheduled for: ~ 8 weeks  Catie Feliz Beam, PharmD, [place], CPP Clinical Pharmacist [facility] [employer] [phone]

## 2020-09-26 ENCOUNTER — Encounter: Payer: BC Managed Care – PPO | Admitting: Orthotics

## 2020-09-27 ENCOUNTER — Encounter: Payer: BC Managed Care – PPO | Admitting: Internal Medicine

## 2020-10-19 ENCOUNTER — Encounter: Payer: BC Managed Care – PPO | Admitting: Internal Medicine

## 2020-11-19 ENCOUNTER — Ambulatory Visit: Payer: BC Managed Care – PPO | Admitting: Pharmacist

## 2020-11-19 DIAGNOSIS — I10 Essential (primary) hypertension: Secondary | ICD-10-CM

## 2020-11-19 DIAGNOSIS — N401 Enlarged prostate with lower urinary tract symptoms: Secondary | ICD-10-CM

## 2020-11-19 DIAGNOSIS — N138 Other obstructive and reflux uropathy: Secondary | ICD-10-CM

## 2020-11-19 DIAGNOSIS — E1165 Type 2 diabetes mellitus with hyperglycemia: Secondary | ICD-10-CM

## 2020-11-19 DIAGNOSIS — E7800 Pure hypercholesterolemia, unspecified: Secondary | ICD-10-CM

## 2020-11-19 NOTE — Chronic Care Management (AMB) (Signed)
Care Management   Pharmacy Note  11/19/2020 Name: Aaron Allison. MRN: [id number] DOB: [birth year]  Subjective: Aaron Allison. is a 55 y.o. year old male who is a primary care patient of Einar Pheasant, MD. The Care Management team was consulted for assistance with care management and care coordination needs.    Engaged with patient by telephone for follow up visit in response to provider referral for pharmacy case management and/or care coordination services.   The patient was given information about Care Management services today including:  1. Care Management services includes personalized support from designated clinical staff supervised by the patient's primary care provider, including individualized plan of care and coordination with other care providers. 2. 24/7 contact phone numbers for assistance for urgent and routine care needs. 3. The patient may stop case management services at any time by phone call to the office staff.  Patient agreed to services and consent obtained.  Assessment:  Review of patient status, including review of consultants reports, laboratory and other test data, was performed as part of comprehensive evaluation and provision of chronic care management services.   SDOH (Social Determinants of Health) assessments and interventions performed:  SDOH Interventions   Flowsheet Row Most Recent Value  SDOH Interventions   Financial Strain Interventions Intervention Not Indicated       Objective:  Lab Results  Component Value Date   CREATININE 1.00 06/22/2020   CREATININE 1.02 02/03/2020   CREATININE 1.03 07/19/2019    Lab Results  Component Value Date   HGBA1C 7.1 (H) 06/22/2020       Component Value Date/Time   CHOL 127 06/22/2020 0822   TRIG 131.0 06/22/2020 0822   HDL 36.50 (L) 06/22/2020 0822   CHOLHDL 3 06/22/2020 0822   VLDL 26.2 06/22/2020 0822   LDLCALC 64 06/22/2020 0822    BP Readings from Last 3 Encounters:  05/25/20 133/83   05/22/20 (!) 151/95  12/29/19 130/78    Care Plan  No Known Allergies  Medications Reviewed Today    Reviewed by De Hollingshead, RPH-CPP (Pharmacist) on 11/19/20 at 54  Med List Status: <None>  Medication Order Taking? Sig Documenting Provider Last Dose Status Informant  aspirin 81 MG tablet [id number] Yes Take 81 mg by mouth daily. [provider] Taking Active   atorvastatin (LIPITOR) 40 MG tablet [id number] Yes TAKE 1 TABLET DAILY Einar Pheasant, MD Taking Active   Lancets (ACCU-CHEK SOFT TOUCH) lancets [id number] Yes Use as instructed Einar Pheasant, MD Taking Active   losartan-hydrochlorothiazide [facility]) 100-12.5 MG tablet [id number] Yes TAKE 1 TABLET DAILY Einar Pheasant, MD Taking Active   meloxicam (MOBIC) 15 MG tablet [id number] Yes Take 1 tablet (15 mg total) by mouth daily. Edrick Kins, DPM Taking Active   metFORMIN (GLUCOPHAGE) 1000 MG tablet [id number] Yes TAKE 1 TABLET TWICE A DAY WITH MEALS Einar Pheasant, MD Taking Active   metoprolol succinate (TOPROL-XL) 25 MG 24 hr tablet [id number] Yes TAKE 1 TABLET DAILY Einar Pheasant, MD Taking Active   omeprazole (PRILOSEC) 40 MG capsule [id number] Yes TAKE 1 CAPSULE DAILY Einar Pheasant, MD Taking Active   tamsulosin (FLOMAX) 0.4 MG CAPS capsule [id number] Yes TAKE 1 CAPSULE DAILY AFTER SUPPER Crecencio Mc, MD Taking Active           Patient Active Problem List   Diagnosis Date Noted  . Epistaxis 03/27/2019  . Benign localized hyperplasia of prostate with urinary obstruction 01/01/2018  . Incomplete emptying of bladder 01/01/2018  . Health  care maintenance 01/01/2015  . GERD (gastroesophageal reflux disease) 01/01/2015  . Enlarged prostate 02/26/2014  . Diabetes mellitus ([place]) 10/08/2012  . Hypertension 10/08/2012  . Hypercholesterolemia 10/08/2012  . Anemia 10/08/2012  . Elevated prostate specific antigen (PSA) 06/01/2012    Conditions to be addressed/monitored: HTN, HLD and DMII  Care Plan :  Medication Management  Updates made by De Hollingshead, RPH-CPP since 11/19/2020 12:00 AM  Completed 11/19/2020  Problem: Diabetes, Hypertension, HLD Resolved 11/19/2020    Long-Range Goal: Disease Progression Prevented or Minimized Completed 11/19/2020  Start Date: 08/08/2020  Recent Progress: On track  Priority: High  Note:   Current Barriers:  . Unable to achieve control of diabetes   Pharmacist Clinical Goal(s):  Marland Kitchen Over the next 90 days, patient will achieve control of diabetes as evidenced by A1c  through collaboration with PharmD and provider.   Interventions: . 1:1 collaboration with Einar Pheasant, MD regarding development and update of comprehensive plan of care as evidenced by provider attestation and co-signature . Inter-disciplinary care team collaboration (see longitudinal plan of care) . Comprehensive medication review performed; medication list updated in electronic medical record  Health Maintenance: . Up to date on COVID vaccinations. Added booster dose to patient's chart  Diabetes: . Uncontrolled per last A1c, but improved per SMBG; current treatment: metformin 1000 mg BID  . Endorses an 12-15 lbs weight gain  . Current glucose readings:fasting: 100s-120s, mostly <130; before super: 90-100; after supper: 110s . Current meal patterns: breakfast: cereal, nuts; lunch: salads; supper meats and vegetables; drinking water, occasional Cheerwine, has significantly cut back on soft drinks since elevated A1c . Current exercise: active job in [employer], active hobbies at home in the yard . Reviewed goal A1c, goal fasting, goal 2 hour post prandial glucose. Praised for continued focus on monitoring. Continued home BG monitoring to evaluate control. PCP visit w/ lab work in ~ 4 weeks  . Encouraged continued adherence to diet changes and medications.  . Reviewed need for yearly foot exam  Hypertension: . Controlled; current treatment: losartan/HCTZ 100/12.5 mg QAM, metoprolol  succinate 25 mg QAM . Current home readings: 110-120s with occasional 130s; 70-80s; worries about occasional elevated systolic . Reviewed diet. He does not add salt when eating food. They do not eat a lot of canned food.  . Reviewed goal to focus on fresh/frozen vegetables and fruits, lean meats, whole grains. Minimization of eating out d/t high sodium content.  . Encouraged to continue to monitor BP at different times of the day. If occasion elevated AM readings become more common, consider moving metoprolol to evening administration.   Hyperlipidemia and ASCVD risk reduction: . Controlled per last lipid panel; current treatment: atorvastatin 40 mg daily . Antiplatelet regimen: aspirin 81 mg daily . Recommended to continue current regimen at this time  BPH: . Controlled per patient report; current regimen: tamsulosin 0.4 mg Qpm . Continue current regimen at this time  Patient Goals/Self-Care Activities . Over the next 90 days, patient will: - take medications as prescribed check glucose BID, document, and provide at future appointments check blood pressure daily, document, and provide at future appointments  Follow Up Plan: Goals of care met. Patient declines need to schedule CCM follow up at this time. Will re-engage in the future if next A1c is elevated or other concerns arise.      Medication Assistance:  None required.  Patient affirms current coverage meets needs.  Follow Up:  Patient requests no follow-up at this time.  Plan: Goals  of care met. Patient declines need to schedule CCM follow up at this time.   Catie Darnelle Maffucci, PharmD, [place], [place] Clinical Pharmacist [employer] at [place]

## 2020-11-19 NOTE — Patient Instructions (Signed)
Visit Information  Goals Addressed            This Visit's Progress   . COMPLETED: Monitor and Manage My Blood Sugar       . Over the next 90 days, patient will: - take medications as prescribed check glucose BID, document, and provide at future appointments check blood pressure daily, document, and provide at future appointments        Patient verbalizes understanding of instructions provided today and agrees to view in [place].   Plan: Goals of care met. Patient declines need to schedule CCM follow up at this time.   Catie Darnelle Maffucci, PharmD, [place], [place] Clinical Pharmacist [employer] at [place]

## 2020-12-24 ENCOUNTER — Encounter: Payer: BC Managed Care – PPO | Admitting: Internal Medicine

## 2021-02-14 ENCOUNTER — Encounter: Payer: Self-pay | Admitting: Internal Medicine

## 2021-03-08 ENCOUNTER — Encounter: Payer: BC Managed Care – PPO | Admitting: Internal Medicine

## 2021-04-03 ENCOUNTER — Other Ambulatory Visit: Payer: Self-pay

## 2021-04-03 ENCOUNTER — Telehealth: Payer: Self-pay | Admitting: Internal Medicine

## 2021-04-03 MED ORDER — METOPROLOL SUCCINATE ER 25 MG PO TB24
25.0000 mg | ORAL_TABLET | Freq: Every day | ORAL | 3 refills | Status: DC
Start: 2021-04-03 — End: 2021-06-25

## 2021-04-03 MED ORDER — OMEPRAZOLE 40 MG PO CPDR
40.0000 mg | DELAYED_RELEASE_CAPSULE | Freq: Every day | ORAL | 3 refills | Status: DC
Start: 2021-04-03 — End: 2021-09-26

## 2021-04-03 MED ORDER — METFORMIN HCL 1000 MG PO TABS
1000.0000 mg | ORAL_TABLET | Freq: Two times a day (BID) | ORAL | 3 refills | Status: DC
Start: 2021-04-03 — End: 2021-09-26

## 2021-04-03 NOTE — Telephone Encounter (Signed)
Called and spoke to [place]. Celia states that he has had recent appointments cancelled and has a future appointment scheduled with Dr. Lorin Picket for 04/30/21. Pt has not been seen since 12/2019. Pt request refills on medications without any further refills. Pended Metoprolol, Prilosec, and Metformin for refills through [facility] and sent to Provider.

## 2021-04-03 NOTE — Telephone Encounter (Signed)
Pt wife called he needs refills on ALL of his medication [facility]

## 2021-04-03 NOTE — Telephone Encounter (Signed)
Pt wife called and would like a call back to discuss refill

## 2021-04-03 NOTE — Telephone Encounter (Signed)
Ok to refill patients Metoprolol, Prilosec, and Metformin through [facility]? Pt has an upcoming appointment on 04/30/21 and was last seen 12/28/20. Metformin last refilled on 01/20/20, Prilosec last refilled 02/28/20, Metoprolol last refilled 03/03/20 for a year supply.

## 2021-04-04 NOTE — Telephone Encounter (Signed)
Spoke to Aaron Allison's wife and discussed medication. She states that the Tamsulosin and the Losartan both needed to be refilled and that Aaron Allison only has 16 pills left. Called [facility] pharmacy and spoke to Aaron Allison. Aaron Allison states that the Tamsulosin and the Losartan both have not been shipped and that there is 3 refills left on the tamsulosin and 2 refills on the losartan. Aaron Allison added both of those to the medication refilled on 04/03/21 and they are going to be shipped with the other medication. Aaron Allison also refilled the Atorvastatin as that needed to be sent as well. Medication is set to be mailed out on 04/09/21 and Aaron Allison gave a confirmation number of [id number]. Called Aaron Allison back and left a message to call back.

## 2021-04-04 NOTE — Telephone Encounter (Signed)
Patients wife called back and wanted to speak to Azzell. Azzell unavailable, patient's wife is at work and will call him back around 3:30 today.

## 2021-04-04 NOTE — Telephone Encounter (Signed)
Received a call from Aaron Allison concerning Aaron Allison's medication. I informed her that Aaron Allison's medication has been sent and the Tamsulosin and Losartin are being processed along with the other medication. Aaron Allison verbalized understanding and had no further questions.

## 2021-04-24 ENCOUNTER — Ambulatory Visit
Admission: EM | Admit: 2021-04-24 | Discharge: 2021-04-24 | Disposition: A | Discharge status: Home / Self Care | Payer: 59 | Attending: Family Medicine | Admitting: Family Medicine

## 2021-04-24 ENCOUNTER — Other Ambulatory Visit: Payer: Self-pay

## 2021-04-24 DIAGNOSIS — Z20822 Contact with and (suspected) exposure to covid-19: Secondary | ICD-10-CM | POA: Insufficient documentation

## 2021-04-24 NOTE — ED Triage Notes (Signed)
Pt c/o exposure to COVID, his grandson tested positive this morning. Pt denies any current symptoms, does report some diarrhea several days ago but this has improved.

## 2021-04-25 LAB — SARS CORONAVIRUS 2 (TAT 6-24 HRS): SARS Coronavirus 2: NEGATIVE

## 2021-04-30 ENCOUNTER — Encounter: Payer: BC Managed Care – PPO | Admitting: Internal Medicine

## 2021-05-23 ENCOUNTER — Telehealth: Payer: Self-pay | Admitting: Internal Medicine

## 2021-05-23 NOTE — Telephone Encounter (Signed)
Patients wife stated that she saw online that there is a recall on metformin and they are unsure if the patient should still take this medicine.Please call his wife at [phone].

## 2021-05-24 NOTE — Telephone Encounter (Signed)
Checked with MD and pharmacist in office- no record of metformin being recalled but advised that they could check the lot number with their pharmacy

## 2021-06-25 ENCOUNTER — Telehealth: Payer: Self-pay | Admitting: Internal Medicine

## 2021-06-25 ENCOUNTER — Other Ambulatory Visit: Payer: Self-pay

## 2021-06-25 ENCOUNTER — Ambulatory Visit (INDEPENDENT_AMBULATORY_CARE_PROVIDER_SITE_OTHER): Payer: 59 | Admitting: Internal Medicine

## 2021-06-25 VITALS — BP 148/92 | HR 73 | Temp 97.6°F | Resp 16 | Ht 70.0 in | Wt 232.0 lb

## 2021-06-25 DIAGNOSIS — E1165 Type 2 diabetes mellitus with hyperglycemia: Secondary | ICD-10-CM

## 2021-06-25 DIAGNOSIS — E7800 Pure hypercholesterolemia, unspecified: Secondary | ICD-10-CM | POA: Diagnosis not present

## 2021-06-25 DIAGNOSIS — K219 Gastro-esophageal reflux disease without esophagitis: Secondary | ICD-10-CM

## 2021-06-25 DIAGNOSIS — Z0000 Encounter for general adult medical examination without abnormal findings: Secondary | ICD-10-CM

## 2021-06-25 DIAGNOSIS — I10 Essential (primary) hypertension: Secondary | ICD-10-CM

## 2021-06-25 DIAGNOSIS — L989 Disorder of the skin and subcutaneous tissue, unspecified: Secondary | ICD-10-CM

## 2021-06-25 DIAGNOSIS — Z125 Encounter for screening for malignant neoplasm of prostate: Secondary | ICD-10-CM

## 2021-06-25 DIAGNOSIS — D649 Anemia, unspecified: Secondary | ICD-10-CM

## 2021-06-25 LAB — HEPATIC FUNCTION PANEL
ALT: 14 U/L (ref 0–53)
AST: 12 U/L (ref 0–37)
Albumin: 4.1 g/dL (ref 3.5–5.2)
Alkaline Phosphatase: 72 U/L (ref 39–117)
Bilirubin, Direct: 0.1 mg/dL (ref 0.0–0.3)
Total Bilirubin: 0.5 mg/dL (ref 0.2–1.2)
Total Protein: 6.8 g/dL (ref 6.0–8.3)

## 2021-06-25 LAB — CBC WITH DIFFERENTIAL/PLATELET
Basophils Absolute: 0.1 10*3/uL (ref 0.0–0.1)
Basophils Relative: 0.6 % (ref 0.0–3.0)
Eosinophils Absolute: 0.2 10*3/uL (ref 0.0–0.7)
Eosinophils Relative: 2.9 % (ref 0.0–5.0)
HCT: 40.6 % (ref 39.0–52.0)
Hemoglobin: 13.9 g/dL (ref 13.0–17.0)
Lymphocytes Relative: 29.9 % (ref 12.0–46.0)
Lymphs Abs: 2.4 10*3/uL (ref 0.7–4.0)
MCHC: 34.3 g/dL (ref 30.0–36.0)
MCV: 87.9 fl (ref 78.0–100.0)
Monocytes Absolute: 0.7 10*3/uL (ref 0.1–1.0)
Monocytes Relative: 8.8 % (ref 3.0–12.0)
Neutro Abs: 4.7 10*3/uL (ref 1.4–7.7)
Neutrophils Relative %: 57.8 % (ref 43.0–77.0)
Platelets: 300 10*3/uL (ref 150.0–400.0)
RBC: 4.62 Mil/uL (ref 4.22–5.81)
RDW: 13.9 % (ref 11.5–15.5)
WBC: 8.1 10*3/uL (ref 4.0–10.5)

## 2021-06-25 LAB — LIPID PANEL
Cholesterol: 128 mg/dL (ref 0–200)
HDL: 43.5 mg/dL (ref >39.00)
LDL Cholesterol: 71 mg/dL (ref 0–99)
NonHDL: 84.92
Total CHOL/HDL Ratio: 3
Triglycerides: 70 mg/dL (ref 0.0–149.0)
VLDL: 14 mg/dL (ref 0.0–40.0)

## 2021-06-25 LAB — BASIC METABOLIC PANEL
BUN: 13 mg/dL (ref 6–23)
CO2: 31 mEq/L (ref 19–32)
Calcium: 9.3 mg/dL (ref 8.4–10.5)
Chloride: 101 mEq/L (ref 96–112)
Creatinine, Ser: 0.91 mg/dL (ref 0.40–1.50)
GFR: 94.46 mL/min (ref >60.00)
Glucose, Bld: 102 mg/dL — ABNORMAL HIGH (ref 70–99)
Potassium: 3.8 mEq/L (ref 3.5–5.1)
Sodium: 139 mEq/L (ref 135–145)

## 2021-06-25 LAB — HM DIABETES FOOT EXAM

## 2021-06-25 LAB — TSH: TSH: 3.16 u[IU]/mL (ref 0.35–5.50)

## 2021-06-25 LAB — HEMOGLOBIN A1C: Hgb A1c MFr Bld: 6.9 % — ABNORMAL HIGH (ref 4.6–6.5)

## 2021-06-25 LAB — PSA: PSA: 1.74 ng/mL (ref 0.10–4.00)

## 2021-06-25 MED ORDER — CARVEDILOL 3.125 MG PO TABS: 3.1250 mg | ORAL_TABLET | Freq: Two times a day (BID) | ORAL | 2 refills | Status: DC

## 2021-06-25 NOTE — Telephone Encounter (Signed)
Check out noted states: Return in about 5 weeks (around 07/30/2021) for follow up appt (needs am appt) around 8:30 - 9:00.   Nothing available until January 2023. Would you like for me to double book or add an appointment to fit this patient in? If so what day?   Message can be sent back to me and Patient is aware I will call him to get him scheduled.

## 2021-06-25 NOTE — Telephone Encounter (Signed)
Can do 8:00 or 9:30 on 08/06/21.  Can see which time he prefers.

## 2021-06-25 NOTE — Progress Notes (Signed)
Patient ID: Aaron Headland., male   DOB: [birth year], 55 y.o.   MRN: [id number]   Subjective:    Patient ID: Aaron Headland., male    DOB: 14-Mar-1966, 55 y.o.   MRN: [id number]  This visit occurred during the SARS-CoV-2 public health emergency.  Safety protocols were in place, including screening questions prior to the visit, additional usage of staff PPE, and extensive cleaning of exam room while observing appropriate contact time as indicated for disinfecting solutions.   Patient here for his physical exam.   Chief Complaint  Patient presents with   Annual Exam   .   HPI He is doing relatively well.  Changed jobs recently.  Still working a lot of hours.  Handling stress well.  No chest pain or sob reported.  No abdominal pain or bowel change reported.  Working third shift.  Discussed diet and exercise.  Has lost weight.     Past Medical History:  Diagnosis Date   GERD (gastroesophageal reflux disease)    Hypercholesterolemia    Hyperglycemia    Hypertension    Iron deficiency anemia    Past Surgical History:  Procedure Laterality Date   INGUINAL HERNIA REPAIR     Family History  Problem Relation Age of Onset   Hypertension Mother    Hypertension Father    CVA Paternal Grandfather    Colon cancer Neg Hx    Prostate cancer Neg Hx    Social History   Socioeconomic History   Marital status: Married    Spouse name: Not on file   Number of children: 2   Years of education: Not on file   Highest education level: Not on file  Occupational History   Not on file  Tobacco Use   Smoking status: Never   Smokeless tobacco: Never  Vaping Use   Vaping Use: Never used  Substance and Sexual Activity   Alcohol use: No    Alcohol/week: 0.0 standard drinks   Drug use: No   Sexual activity: Not on file  Other Topics Concern   Not on file  Social History Narrative   ** Merged History Encounter **       Social Determinants of Health   Financial Resource Strain: Low Risk     Difficulty of Paying Living Expenses: Not hard at all  Food Insecurity: Not on file  Transportation Needs: Not on file  Physical Activity: Insufficiently Active   Days of Exercise per Week: 2 days   Minutes of Exercise per Session: 40 min  Stress: Not on file  Social Connections: Not on file     Review of Systems  Constitutional:  Negative for appetite change and unexpected weight change.  HENT:  Negative for congestion, sinus pressure and sore throat.   Eyes:  Negative for pain and visual disturbance.  Respiratory:  Negative for cough, chest tightness and shortness of breath.   Cardiovascular:  Negative for chest pain, palpitations and leg swelling.  Gastrointestinal:  Negative for abdominal pain, diarrhea, nausea and vomiting.  Genitourinary:  Negative for difficulty urinating and dysuria.  Musculoskeletal:  Negative for joint swelling and myalgias.  Skin:  Negative for color change and rash.  Neurological:  Negative for dizziness, light-headedness and headaches.  Hematological:  Negative for adenopathy. Does not bruise/bleed easily.  Psychiatric/Behavioral:  Negative for agitation and dysphoric mood.       Objective:     BP (!) 148/92   Pulse 73   Temp 97.6 F (  36.4 C)   Resp 16   Ht 5' 10" (1.778 m)   Wt 232 lb (105.2 kg)   SpO2 98%   BMI 33.29 kg/m  Wt Readings from Last 3 Encounters:  06/25/21 232 lb (105.2 kg)  04/24/21 238 lb (108 kg)  05/25/20 245 lb (111.1 kg)    Physical Exam Constitutional:      General: He is not in acute distress.    Appearance: Normal appearance. He is well-developed.  HENT:     Head: Normocephalic and atraumatic.     Right Ear: External ear normal.     Left Ear: External ear normal.  Eyes:     General: No scleral icterus.       Right eye: No discharge.        Left eye: No discharge.     Conjunctiva/sclera: Conjunctivae normal.  Neck:     Thyroid: No thyromegaly.  Cardiovascular:     Rate and Rhythm: Normal rate and regular  rhythm.  Pulmonary:     Effort: No respiratory distress.     Breath sounds: Normal breath sounds. No wheezing.  Abdominal:     General: Bowel sounds are normal.     Palpations: Abdomen is soft.     Tenderness: There is no abdominal tenderness.  Musculoskeletal:        General: No swelling or tenderness.     Cervical back: Neck supple. No tenderness.  Lymphadenopathy:     Cervical: No cervical adenopathy.  Skin:    Findings: No erythema or rash.  Neurological:     Mental Status: He is alert and oriented to person, place, and time.  Psychiatric:        Mood and Affect: Mood normal.        Behavior: Behavior normal.     Outpatient Encounter Medications as of 06/25/2021  Medication Sig   carvedilol (COREG) 3.125 MG tablet Take 1 tablet (3.125 mg total) by mouth 2 (two) times daily with a meal.   aspirin 81 MG tablet Take 81 mg by mouth daily.   atorvastatin (LIPITOR) 40 MG tablet TAKE 1 TABLET DAILY   Lancets (ACCU-CHEK SOFT TOUCH) lancets Use as instructed   losartan-hydrochlorothiazide (HYZAAR) 100-12.5 MG tablet TAKE 1 TABLET DAILY   meloxicam (MOBIC) 15 MG tablet Take 1 tablet (15 mg total) by mouth daily.   metFORMIN (GLUCOPHAGE) 1000 MG tablet Take 1 tablet (1,000 mg total) by mouth 2 (two) times daily with a meal.   omeprazole (PRILOSEC) 40 MG capsule Take 1 capsule (40 mg total) by mouth daily.   tamsulosin (FLOMAX) 0.4 MG CAPS capsule TAKE 1 CAPSULE DAILY AFTER SUPPER   [DISCONTINUED] metoprolol succinate (TOPROL-XL) 25 MG 24 hr tablet Take 1 tablet (25 mg total) by mouth daily.   No facility-administered encounter medications on file as of 06/25/2021.     Lab Results  Component Value Date   WBC 8.1 06/25/2021   HGB 13.9 06/25/2021   HCT 40.6 06/25/2021   PLT 300.0 06/25/2021   GLUCOSE 102 (H) 06/25/2021   CHOL 128 06/25/2021   TRIG 70.0 06/25/2021   HDL 43.50 06/25/2021   LDLCALC 71 06/25/2021   ALT 14 06/25/2021   AST 12 06/25/2021   NA 139 06/25/2021   K 3.8  06/25/2021   CL 101 06/25/2021   CREATININE 0.91 06/25/2021   BUN 13 06/25/2021   CO2 31 06/25/2021   TSH 3.16 06/25/2021   PSA 1.74 06/25/2021   HGBA1C 6.9 (H) 06/25/2021  MICROALBUR 0.7 02/03/2020       Assessment & Plan:   Problem List Items Addressed This Visit     Anemia    Follow cbc.       Diabetes mellitus ([place])    Low carb diet and exercise.  Follow met b and a1c. Sugars as outlined.        Relevant Orders   Hemoglobin A1c (Completed)   GERD (gastroesophageal reflux disease)    No upper symptoms reported.  On omeprazole.        Health care maintenance    Physical today 06/25/21.  Followed by urology for psa.  Colonoscopy 05/2017.        Hypercholesterolemia    On lipitor.  Low cholesterol diet and exercise.  Follow lipid panel and liver function tests.        Relevant Medications   carvedilol (COREG) 3.125 MG tablet   Other Relevant Orders   Hepatic function panel (Completed)   Lipid panel (Completed)   Hypertension    Blood pressure remains elevated.  Change metoprolol to coreg.  Start with 3.177m bid.  Will titrate as needed.  Follow pressures.  Get him back in soon to reassess.       Relevant Medications   carvedilol (COREG) 3.125 MG tablet   Other Relevant Orders   CBC with Differential/Platelet (Completed)   Basic metabolic panel (Completed)   TSH (Completed)   Skin lesion    Persistent. Noted on face/chin.  Refer to dermatology.       Other Visit Diagnoses     Routine general medical examination at a health care facility    -  Primary   Prostate cancer screening       Relevant Orders   PSA (Completed)        CEinar Pheasant MD

## 2021-06-26 NOTE — Telephone Encounter (Signed)
Called and scheduled Patient for 08/06/21 at 9:30 am.

## 2021-06-30 ENCOUNTER — Encounter: Payer: Self-pay | Admitting: Internal Medicine

## 2021-06-30 ENCOUNTER — Telehealth: Payer: Self-pay | Admitting: Internal Medicine

## 2021-06-30 DIAGNOSIS — L989 Disorder of the skin and subcutaneous tissue, unspecified: Secondary | ICD-10-CM

## 2021-06-30 NOTE — Assessment & Plan Note (Signed)
Persistent. Noted on face/chin.  Refer to dermatology.

## 2021-06-30 NOTE — Telephone Encounter (Signed)
Please call [place] and confirm that he wants to see Dr Cheree Ditto for derrmatology - facial lesion.

## 2021-06-30 NOTE — Assessment & Plan Note (Signed)
Follow cbc.  

## 2021-06-30 NOTE — Assessment & Plan Note (Signed)
No upper symptoms reported.  On omeprazole.  

## 2021-06-30 NOTE — Assessment & Plan Note (Signed)
Blood pressure remains elevated.  Change metoprolol to coreg.  Start with 3.125mg  bid.  Will titrate as needed.  Follow pressures.  Get him back in soon to reassess.

## 2021-06-30 NOTE — Assessment & Plan Note (Signed)
Low carb diet and exercise.  Follow met b and a1c. Sugars as outlined.   

## 2021-06-30 NOTE — Assessment & Plan Note (Signed)
Physical today 06/25/21.  Followed by urology for psa.  Colonoscopy 05/2017.

## 2021-06-30 NOTE — Assessment & Plan Note (Signed)
On lipitor.  Low cholesterol diet and exercise.  Follow lipid panel and liver function tests.   

## 2021-07-03 NOTE — Telephone Encounter (Signed)
Patient confirmed he does want to see Dr Cheree Ditto. I have ordered referral and pended for you to sign. Just wanted to confirm that all info is correct in referral prior to signing.

## 2021-07-03 NOTE — Telephone Encounter (Signed)
Order signed.

## 2021-07-03 NOTE — Addendum Note (Signed)
Addended by: Larry Sierras on: 07/03/2021 07:14 AM   Modules accepted: Orders

## 2021-07-03 NOTE — Addendum Note (Signed)
Addended by: Charm Barges on: 07/03/2021 07:20 AM   Modules accepted: Orders

## 2021-07-07 ENCOUNTER — Other Ambulatory Visit: Payer: Self-pay | Admitting: Internal Medicine

## 2021-07-31 LAB — HM DIABETES EYE EXAM

## 2021-08-06 ENCOUNTER — Other Ambulatory Visit: Payer: Self-pay

## 2021-08-06 ENCOUNTER — Encounter: Payer: Self-pay | Admitting: Internal Medicine

## 2021-08-06 ENCOUNTER — Ambulatory Visit (INDEPENDENT_AMBULATORY_CARE_PROVIDER_SITE_OTHER): Payer: 59 | Admitting: Internal Medicine

## 2021-08-06 VITALS — BP 138/80 | HR 81 | Temp 97.9°F | Resp 16 | Ht 70.0 in | Wt 233.0 lb

## 2021-08-06 DIAGNOSIS — E7800 Pure hypercholesterolemia, unspecified: Secondary | ICD-10-CM | POA: Diagnosis not present

## 2021-08-06 DIAGNOSIS — E1165 Type 2 diabetes mellitus with hyperglycemia: Secondary | ICD-10-CM | POA: Diagnosis not present

## 2021-08-06 DIAGNOSIS — K219 Gastro-esophageal reflux disease without esophagitis: Secondary | ICD-10-CM | POA: Diagnosis not present

## 2021-08-06 DIAGNOSIS — I10 Essential (primary) hypertension: Secondary | ICD-10-CM

## 2021-08-06 MED ORDER — CARVEDILOL 6.25 MG PO TABS: 6.2500 mg | ORAL_TABLET | Freq: Two times a day (BID) | ORAL | 3 refills | Status: DC

## 2021-08-06 NOTE — Progress Notes (Signed)
Patient ID: Aaron Allison., male   DOB: [birth year], 55 y.o.   MRN: [id number]   Subjective:    Patient ID: Aaron Allison., male    DOB: [birth year], 55 y.o.   MRN: [id number]  This visit occurred during the SARS-CoV-2 public health emergency.  Safety protocols were in place, including screening questions prior to the visit, additional usage of staff PPE, and extensive cleaning of exam room while observing appropriate contact time as indicated for disinfecting solutions.   Patient here for a scheduled follow up.   Chief Complaint  Patient presents with   Hypertension    Follow up on changing metoprolol to carvedilol.    Marland Kitchen   HPI Last visit, metoprolol was changed to carvedilol to see if could achieve better blood pressure control. He is tolerating.  No headache or dizziness.  No chest pain or sob.  No acid reflux.  No abdominal pain.  Bowels moving.  Reviewed outside blood pressures:  averaging mostly 130-140s/70-90.  Blood sugars 90-110 in am and 90-120 in the evening.     Past Medical History:  Diagnosis Date   GERD (gastroesophageal reflux disease)    Hypercholesterolemia    Hyperglycemia    Hypertension    Iron deficiency anemia    Past Surgical History:  Procedure Laterality Date   INGUINAL HERNIA REPAIR     Family History  Problem Relation Age of Onset   Hypertension Mother    Hypertension Father    CVA Paternal Grandfather    Colon cancer Neg Hx    Prostate cancer Neg Hx    Social History   Socioeconomic History   Marital status: Married    Spouse name: Not on file   Number of children: 2   Years of education: Not on file   Highest education level: Not on file  Occupational History   Not on file  Tobacco Use   Smoking status: Never   Smokeless tobacco: Never  Vaping Use   Vaping Use: Never used  Substance and Sexual Activity   Alcohol use: No    Alcohol/week: 0.0 standard drinks   Drug use: No   Sexual activity: Not on file  Other Topics Concern   Not  on file  Social History Narrative   ** Merged History Encounter **       Social Determinants of Health   Financial Resource Strain: Low Risk    Difficulty of Paying Living Expenses: Not hard at all  Food Insecurity: Not on file  Transportation Needs: Not on file  Physical Activity: Not on file  Stress: Not on file  Social Connections: Not on file     Review of Systems  Constitutional:  Negative for appetite change and unexpected weight change.  HENT:  Negative for congestion and sinus pressure.   Respiratory:  Negative for cough, chest tightness and shortness of breath.   Cardiovascular:  Negative for chest pain and palpitations.  Gastrointestinal:  Negative for abdominal pain, diarrhea, nausea and vomiting.  Genitourinary:  Negative for difficulty urinating and dysuria.  Musculoskeletal:  Negative for joint swelling and myalgias.  Skin:  Negative for color change and rash.  Neurological:  Negative for dizziness, light-headedness and headaches.  Psychiatric/Behavioral:  Negative for agitation and dysphoric mood.       Objective:     BP 138/80   Pulse 81   Temp 97.9 F (36.6 C)   Resp 16   Ht 5' 10"  (1.778 m)   Wt 233  lb (105.7 kg)   SpO2 98%   BMI 33.43 kg/m  Wt Readings from Last 3 Encounters:  08/06/21 233 lb (105.7 kg)  06/25/21 232 lb (105.2 kg)  04/24/21 238 lb (108 kg)    Physical Exam Constitutional:      General: He is not in acute distress.    Appearance: Normal appearance. He is well-developed.  HENT:     Head: Normocephalic and atraumatic.     Right Ear: External ear normal.     Left Ear: External ear normal.  Eyes:     General: No scleral icterus.       Right eye: No discharge.        Left eye: No discharge.  Cardiovascular:     Rate and Rhythm: Normal rate and regular rhythm.  Pulmonary:     Effort: Pulmonary effort is normal. No respiratory distress.     Breath sounds: Normal breath sounds.  Abdominal:     General: Bowel sounds are  normal.     Palpations: Abdomen is soft.     Tenderness: There is no abdominal tenderness.  Musculoskeletal:        General: No swelling or tenderness.     Cervical back: Neck supple. No tenderness.  Lymphadenopathy:     Cervical: No cervical adenopathy.  Skin:    Findings: No erythema or rash.  Neurological:     Mental Status: He is alert.  Psychiatric:        Mood and Affect: Mood normal.        Behavior: Behavior normal.     Outpatient Encounter Medications as of 08/06/2021  Medication Sig   carvedilol (COREG) 6.25 MG tablet Take 1 tablet (6.25 mg total) by mouth 2 (two) times daily with a meal.   aspirin 81 MG tablet Take 81 mg by mouth daily.   atorvastatin (LIPITOR) 40 MG tablet TAKE 1 TABLET DAILY   Lancets (ACCU-CHEK SOFT TOUCH) lancets Use as instructed   losartan-hydrochlorothiazide (HYZAAR) 100-12.5 MG tablet TAKE 1 TABLET DAILY   meloxicam (MOBIC) 15 MG tablet Take 1 tablet (15 mg total) by mouth daily.   metFORMIN (GLUCOPHAGE) 1000 MG tablet Take 1 tablet (1,000 mg total) by mouth 2 (two) times daily with a meal.   omeprazole (PRILOSEC) 40 MG capsule Take 1 capsule (40 mg total) by mouth daily.   tamsulosin (FLOMAX) 0.4 MG CAPS capsule TAKE 1 CAPSULE DAILY AFTER SUPPER   [DISCONTINUED] carvedilol (COREG) 3.125 MG tablet Take 1 tablet (3.125 mg total) by mouth 2 (two) times daily with a meal.   No facility-administered encounter medications on file as of 08/06/2021.     Lab Results  Component Value Date   WBC 8.1 06/25/2021   HGB 13.9 06/25/2021   HCT 40.6 06/25/2021   PLT 300.0 06/25/2021   GLUCOSE 102 (H) 06/25/2021   CHOL 128 06/25/2021   TRIG 70.0 06/25/2021   HDL 43.50 06/25/2021   LDLCALC 71 06/25/2021   ALT 14 06/25/2021   AST 12 06/25/2021   NA 139 06/25/2021   K 3.8 06/25/2021   CL 101 06/25/2021   CREATININE 0.91 06/25/2021   BUN 13 06/25/2021   CO2 31 06/25/2021   TSH 3.16 06/25/2021   PSA 1.74 06/25/2021   HGBA1C 6.9 (H) 06/25/2021    MICROALBUR 0.7 02/03/2020       Assessment & Plan:   Problem List Items Addressed This Visit     Diabetes mellitus ([place])    Low carb diet and exercise.  Follow met b and a1c. Sugars as outlined.        Relevant Orders   Hemoglobin A1c   GERD (gastroesophageal reflux disease)    No upper symptoms reported.  On omeprazole.        Hypercholesterolemia    On lipitor.  Low cholesterol diet and exercise.  Follow lipid panel and liver function tests.        Relevant Medications   carvedilol (COREG) 6.25 MG tablet   Other Relevant Orders   Lipid panel   Hepatic function panel   Hypertension - Primary    Blood pressure remains elevated.  Increase carvedilol to 6.30m bid.  Follow pressures.  Get him back in soon to reassess. Follow pressures.        Relevant Medications   carvedilol (COREG) 6.25 MG tablet   Other Relevant Orders   Basic metabolic panel     CEinar Pheasant MD

## 2021-08-11 ENCOUNTER — Encounter: Payer: Self-pay | Admitting: Internal Medicine

## 2021-08-11 NOTE — Assessment & Plan Note (Signed)
Low carb diet and exercise.  Follow met b and a1c. Sugars as outlined.

## 2021-08-11 NOTE — Assessment & Plan Note (Signed)
On lipitor.  Low cholesterol diet and exercise.  Follow lipid panel and liver function tests.   

## 2021-08-11 NOTE — Assessment & Plan Note (Signed)
Blood pressure remains elevated.  Increase carvedilol to 6.25mg  bid.  Follow pressures.  Get him back in soon to reassess. Follow pressures.

## 2021-08-11 NOTE — Assessment & Plan Note (Signed)
No upper symptoms reported.  On omeprazole.  

## 2021-08-27 ENCOUNTER — Telehealth: Payer: Self-pay

## 2021-08-27 ENCOUNTER — Encounter: Payer: Self-pay | Admitting: Internal Medicine

## 2021-08-27 NOTE — Telephone Encounter (Signed)
LMTCB to triage

## 2021-08-27 NOTE — Telephone Encounter (Signed)
The new medicine you put me on low number is in mid 90s and I been having headaches 3 in the last month

## 2021-08-28 ENCOUNTER — Telehealth (INDEPENDENT_AMBULATORY_CARE_PROVIDER_SITE_OTHER): Payer: 59 | Admitting: Internal Medicine

## 2021-08-28 DIAGNOSIS — E1165 Type 2 diabetes mellitus with hyperglycemia: Secondary | ICD-10-CM | POA: Diagnosis not present

## 2021-08-28 DIAGNOSIS — K219 Gastro-esophageal reflux disease without esophagitis: Secondary | ICD-10-CM

## 2021-08-28 DIAGNOSIS — I10 Essential (primary) hypertension: Secondary | ICD-10-CM

## 2021-08-28 DIAGNOSIS — R519 Headache, unspecified: Secondary | ICD-10-CM

## 2021-08-28 DIAGNOSIS — R0683 Snoring: Secondary | ICD-10-CM

## 2021-08-28 MED ORDER — CARVEDILOL 12.5 MG PO TABS: 12.5000 mg | ORAL_TABLET | Freq: Two times a day (BID) | ORAL | 2 refills | Status: DC

## 2021-08-28 NOTE — Progress Notes (Signed)
Patient ID: Aaron Allison., male   DOB: 07-Apr-1966, 55 y.o.   MRN: [id number]   Virtual Visit via video Note  This visit type was conducted due to national recommendations for restrictions regarding the COVID-19 pandemic (e.g. social distancing).  This format is felt to be most appropriate for this patient at this time.  All issues noted in this document were discussed and addressed.  No physical exam was performed (except for noted visual exam findings with Video Visits).   I connected with [name] by a video enabled telemedicine application and verified that I am speaking with the correct person using two identifiers. Location patient: home Location provider: work  Persons participating in the virtual visit: patient, provider  The limitations, risks, security and privacy concerns of performing an evaluation and management service by video and the availability of in person appointments have been discussed.  It has also been  discussed with the patient that there may be a patient responsible charge related to this service. The patient expressed understanding and agreed to proceed.  Reason for visit: work in appt  HPI: Work in with concerns regarding persistent elevated blood pressure and intermittent headache.  Reviewed outside blood pressure readings - 134-152/80-90s.  Increased carvedilol to 6.53m bid last visit.  (Pulse 70-80).  Does report headache this am and yesterday.  No headache currently.  Also reports headache last week.  Describes headache as feeling like - "sinus headache".  Not severe.  Resolves with lying down, shutting eyes and resting.  No dizziness or light headedness.  No sinus congestion or nasal congestion.  No sore throat.  No chest pain, chest tightness or sob.  Does report some fatigue.  Feels working third shift could be affecting.  Also reports increased snoring.  Does not feel rested when wakes up.     ROS: See pertinent positives and negatives per HPI.  Past  Medical History:  Diagnosis Date   GERD (gastroesophageal reflux disease)    Hypercholesterolemia    Hyperglycemia    Hypertension    Iron deficiency anemia     Past Surgical History:  Procedure Laterality Date   INGUINAL HERNIA REPAIR      Family History  Problem Relation Age of Onset   Hypertension Mother    Hypertension Father    CVA Paternal Grandfather    Colon cancer Neg Hx    Prostate cancer Neg Hx     SOCIAL HX: reviewed.    Current Outpatient Medications:    carvedilol (COREG) 12.5 MG tablet, Take 1 tablet (12.5 mg total) by mouth 2 (two) times daily with a meal., Disp: 60 tablet, Rfl: 2   aspirin 81 MG tablet, Take 81 mg by mouth daily., Disp: , Rfl:    atorvastatin (LIPITOR) 40 MG tablet, TAKE 1 TABLET DAILY, Disp: 90 tablet, Rfl: 3   Lancets (ACCU-CHEK SOFT TOUCH) lancets, Use as instructed, Disp: 100 each, Rfl: 5   losartan-hydrochlorothiazide (HYZAAR) 100-12.5 MG tablet, TAKE 1 TABLET DAILY, Disp: 90 tablet, Rfl: 3   meloxicam (MOBIC) 15 MG tablet, Take 1 tablet (15 mg total) by mouth daily., Disp: 30 tablet, Rfl: 1   metFORMIN (GLUCOPHAGE) 1000 MG tablet, Take 1 tablet (1,000 mg total) by mouth 2 (two) times daily with a meal., Disp: 180 tablet, Rfl: 3   omeprazole (PRILOSEC) 40 MG capsule, Take 1 capsule (40 mg total) by mouth daily., Disp: 90 capsule, Rfl: 3   tamsulosin (FLOMAX) 0.4 MG CAPS capsule, TAKE 1 CAPSULE DAILY AFTER  SUPPER, Disp: 90 capsule, Rfl: 3  EXAM:  GENERAL: alert, oriented, appears well and in no acute distress  HEENT: atraumatic, conjunttiva clear, no obvious abnormalities on inspection of external nose and ears  NECK: normal movements of the head and neck  LUNGS: on inspection no signs of respiratory distress, breathing rate appears normal, no obvious gross SOB, gasping or wheezing  CV: no obvious cyanosis  PSYCH/NEURO: pleasant and cooperative, no obvious depression or anxiety, speech and thought processing grossly  intact  ASSESSMENT AND PLAN:  Discussed the following assessment and plan:  Problem List Items Addressed This Visit     Diabetes mellitus ([place])    Low carb diet and exercise.  Follow met b and a1c.        GERD (gastroesophageal reflux disease)    No upper symptoms reported.  On omeprazole.        Headache    Intermittent headache as outlined.  No headache currently.  Resolves with shutting eyes and rest.  Treat blood pressure as outlined.  Schedule pulmonary evaluation for further w/up regarding possible sleep apnea.  No evidence of sinus congestion or nasal congestion.  Follow.  Any acute change or problems, he is to be reevaluated.        Relevant Medications   carvedilol (COREG) 12.5 MG tablet   Other Relevant Orders   Ambulatory referral to Pulmonology   Hypertension    Persistent elevation in blood pressure as outlined.  Continue hyzaar.  Increase carvedilol to 12.73m bid.  Discussed further evaluation for sleep apnea.  Follow pressures.  Send in readings.        Relevant Medications   carvedilol (COREG) 12.5 MG tablet   Other Relevant Orders   Ambulatory referral to Pulmonology   Snoring    Increased snoring, daytime somnolence, headache and elevated blood pressure.  Discussed possible sleep apnea.  Agreeable for further evaluation.  Refer to pulmonary.       Relevant Orders   Ambulatory referral to Pulmonology    Return if symptoms worsen or fail to improve, for keep scheduled.   I discussed the assessment and treatment plan with the patient. The patient was provided an opportunity to ask questions and all were answered. The patient agreed with the plan and demonstrated an understanding of the instructions.   The patient was advised to call back or seek an in-person evaluation if the symptoms worsen or if the condition fails to improve as anticipated.    CEinar Pheasant MD

## 2021-08-28 NOTE — Telephone Encounter (Signed)
Spoke with patient. We saw him recently and changed his blood pressure medications. He is currently taking carvedilol 6.25 mg BID and losartan-hctz 100-12.5mg  q day. BP today was 153/83.   11/26 152/94 11/27 134/80 11/28 143/95 11/29 141/92  Patient says he has had "sinus" headaches the last couple of days. Tylenol does not help. He says the only way he can get rid of the head ache is to sleep. No other acute symptoms.

## 2021-08-28 NOTE — Telephone Encounter (Signed)
Patient returned office phone call. 

## 2021-08-28 NOTE — Telephone Encounter (Signed)
Pulse has been in the 70s-80s. Patient is aware of below and is ok to do virtual if preferred or try increasing medication first then re-evaluate.

## 2021-08-28 NOTE — Telephone Encounter (Signed)
Was seen as virtual visit today.

## 2021-08-28 NOTE — Telephone Encounter (Signed)
Blood pressure remains elevated.  Does he have any pulse readings with blood pressure readings (or does he remember what pulse has been running).  One possibility is to increase carvedilol to 12.5mg  bid if pulse is ok.  Can adjust another medication or add medication if need to.  This will help with blood pressure, but I am not sure if headaches are coming from this - given previous message about sinus symptoms.  Ok to take tylenol.  Would avoid antiinflammatories.

## 2021-08-29 ENCOUNTER — Encounter: Payer: Self-pay | Admitting: Internal Medicine

## 2021-08-29 DIAGNOSIS — R0683 Snoring: Secondary | ICD-10-CM | POA: Insufficient documentation

## 2021-08-29 DIAGNOSIS — R519 Headache, unspecified: Secondary | ICD-10-CM | POA: Insufficient documentation

## 2021-08-29 NOTE — Assessment & Plan Note (Signed)
Persistent elevation in blood pressure as outlined.  Continue hyzaar.  Increase carvedilol to 12.5mg  bid.  Discussed further evaluation for sleep apnea.  Follow pressures.  Send in readings.

## 2021-08-29 NOTE — Assessment & Plan Note (Signed)
Intermittent headache as outlined.  No headache currently.  Resolves with shutting eyes and rest.  Treat blood pressure as outlined.  Schedule pulmonary evaluation for further w/up regarding possible sleep apnea.  No evidence of sinus congestion or nasal congestion.  Follow.  Any acute change or problems, he is to be reevaluated.

## 2021-08-29 NOTE — Assessment & Plan Note (Signed)
No upper symptoms reported.  On omeprazole.  

## 2021-08-29 NOTE — Assessment & Plan Note (Signed)
Low carb diet and exercise.  Follow met b and a1c.   

## 2021-08-29 NOTE — Assessment & Plan Note (Signed)
Increased snoring, daytime somnolence, headache and elevated blood pressure.  Discussed possible sleep apnea.  Agreeable for further evaluation.  Refer to pulmonary.

## 2021-09-24 ENCOUNTER — Encounter: Payer: Self-pay | Admitting: Internal Medicine

## 2021-09-26 ENCOUNTER — Other Ambulatory Visit: Payer: Self-pay

## 2021-09-26 ENCOUNTER — Other Ambulatory Visit: Payer: Self-pay | Admitting: Internal Medicine

## 2021-09-26 MED ORDER — LOSARTAN POTASSIUM-HCTZ 100-12.5 MG PO TABS: 1.0000 | ORAL_TABLET | Freq: Every day | ORAL | 3 refills | Status: DC

## 2021-09-26 MED ORDER — ATORVASTATIN CALCIUM 40 MG PO TABS: 40.0000 mg | ORAL_TABLET | Freq: Every day | ORAL | 3 refills | Status: DC

## 2021-09-26 MED ORDER — METFORMIN HCL 1000 MG PO TABS: 1000.0000 mg | ORAL_TABLET | Freq: Two times a day (BID) | ORAL | 3 refills | Status: DC

## 2021-09-26 MED ORDER — OMEPRAZOLE 40 MG PO CPDR: 40.0000 mg | DELAYED_RELEASE_CAPSULE | Freq: Every day | ORAL | 3 refills | Status: DC

## 2021-09-26 MED ORDER — TAMSULOSIN HCL 0.4 MG PO CAPS: ORAL_CAPSULE | ORAL | 3 refills | Status: DC

## 2021-09-26 MED ORDER — CARVEDILOL 12.5 MG PO TABS: 12.5000 mg | ORAL_TABLET | Freq: Two times a day (BID) | ORAL | 2 refills | Status: DC

## 2021-10-15 ENCOUNTER — Ambulatory Visit: Payer: 59 | Admitting: Internal Medicine

## 2021-10-16 DIAGNOSIS — Z872 Personal history of diseases of the skin and subcutaneous tissue: Secondary | ICD-10-CM | POA: Diagnosis not present

## 2021-10-16 DIAGNOSIS — L578 Other skin changes due to chronic exposure to nonionizing radiation: Secondary | ICD-10-CM | POA: Diagnosis not present

## 2021-10-16 DIAGNOSIS — L281 Prurigo nodularis: Secondary | ICD-10-CM | POA: Diagnosis not present

## 2021-10-16 DIAGNOSIS — L570 Actinic keratosis: Secondary | ICD-10-CM | POA: Diagnosis not present

## 2021-10-17 ENCOUNTER — Ambulatory Visit (INDEPENDENT_AMBULATORY_CARE_PROVIDER_SITE_OTHER): Payer: 59 | Admitting: Internal Medicine

## 2021-10-17 ENCOUNTER — Encounter: Payer: Self-pay | Admitting: Internal Medicine

## 2021-10-17 ENCOUNTER — Other Ambulatory Visit: Payer: Self-pay

## 2021-10-17 VITALS — BP 140/90 | HR 75 | Temp 98.0°F | Ht 70.0 in | Wt 234.2 lb

## 2021-10-17 DIAGNOSIS — G4719 Other hypersomnia: Secondary | ICD-10-CM | POA: Diagnosis not present

## 2021-10-17 NOTE — Patient Instructions (Signed)
Obtain sleep study  Recommend weight loss   

## 2021-10-17 NOTE — Progress Notes (Signed)
Name: Aaron Allison. MRN: [id number] DOB: [birth year]      CHIEF COMPLAINT: Excessive daytime sleepiness  HISTORY OF PRESENT ILLNESS:   Patient is seen today for problems and issues with sleep related to excessive daytime sleepiness Patient  has been having sleep problems for many years Patient has been having excessive daytime sleepiness for a long time Patient has been having extreme fatigue and tiredness, lack of energy +  very Loud snoring every night   Patient works at night ligate making cigarettes Comes home and sleeps around 9:30 AM and wakes up at 3:30 PM Patient does have excessive daytime sleepiness and fatigue at times    Discussed sleep data and reviewed with patient.  Encouraged proper weight management.  Discussed driving precautions and its relationship with hypersomnolence.  Discussed operating dangerous equipment and its relationship with hypersomnolence.  Discussed sleep hygiene, and benefits of a fixed sleep waked time.  The importance of getting eight or more hours of sleep discussed with patient.  Discussed limiting the use of the computer and television before bedtime.  Decrease naps during the day, so night time sleep will become enhanced.  Limit caffeine, and sleep deprivation.  HTN, stroke, and heart failure are potential risk factors.    EPWORTH SLEEP SCORE 6    PAST MEDICAL HISTORY :   has a past medical history of GERD (gastroesophageal reflux disease), Hypercholesterolemia, Hyperglycemia, Hypertension, and Iron deficiency anemia.  has a past surgical history that includes Inguinal hernia repair. Prior to Admission medications   Medication Sig Start Date End Date Taking? Authorizing Provider  aspirin 81 MG tablet Take 81 mg by mouth daily.   Yes [provider]  atorvastatin (LIPITOR) 40 MG tablet Take 1 tablet (40 mg total) by mouth daily. 09/26/21  Yes Dale [place], MD  COREG 12.5 MG tablet TAKE 1 TABLET TWICE DAILY  WITH  MEALS 09/27/21  Yes Dale [place], MD  Lancets (ACCU-CHEK SOFT TOUCH) lancets Use as instructed 12/29/13  Yes Dale Proctor, MD  losartan-hydrochlorothiazide (HYZAAR) 100-12.5 MG tablet Take 1 tablet by mouth daily. 09/26/21  Yes Dale [place], MD  meloxicam (MOBIC) 15 MG tablet Take 1 tablet (15 mg total) by mouth daily. Patient taking differently: Take 15 mg by mouth as needed. 07/06/20  Yes Felecia Shelling, DPM  metFORMIN (GLUCOPHAGE) 1000 MG tablet Take 1 tablet (1,000 mg total) by mouth 2 (two) times daily with a meal. 09/26/21  Yes Dale [place], MD  omeprazole (PRILOSEC) 40 MG capsule Take 1 capsule (40 mg total) by mouth daily. 09/26/21  Yes Dale [place], MD  tamsulosin (FLOMAX) 0.4 MG CAPS capsule TAKE 1 CAPSULE DAILY AFTER SUPPER 09/26/21  Yes Dale , MD   No Known Allergies  FAMILY HISTORY:  family history includes CVA in his paternal grandfather; Hypertension in his father and mother. SOCIAL HISTORY:  reports that he has never smoked. He has never used smokeless tobacco. He reports that he does not drink alcohol and does not use drugs.   Review of Systems:  Gen:  Denies  fever, sweats, chills weight loss  HEENT: Denies blurred vision, double vision, ear pain, eye pain, hearing loss, nose bleeds, sore throat Cardiac:  No dizziness, chest pain or heaviness, chest tightness,edema, No JVD Resp:   No cough, -sputum production, -shortness of breath,-wheezing, -hemoptysis,  Gi: Denies swallowing difficulty, stomach pain, nausea or vomiting, diarrhea, constipation, Other:  All other systems negative   ALL OTHER ROS ARE NEGATIVE   BP 140/90 (BP Location:  Left Arm, Cuff Size: Normal)    Pulse 75    Temp 98 F (36.7 C) (Temporal)    Ht 5\' 10"  (1.778 m)    Wt 234 lb 3.2 oz (106.2 kg)    SpO2 99%    BMI 33.60 kg/m     Physical Examination:   General Appearance: No distress  EYES PERRLA, EOM intact.   NECK Supple, No JVD Pulmonary: normal breath sounds, No  wheezing.  CardiovascularNormal S1,S2.  No m/r/g.   Abdomen: Benign, Soft, non-tender.  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  Diabetes Mellitis - Sleep apnea can contribute to DM, therefore treatment of sleep apnea is important part of DM management.    MEDICATION ADJUSTMENTS/LABS AND TESTS ORDERED: Obtain home sleep study   CURRENT MEDICATIONS REVIEWED AT LENGTH WITH PATIENT TODAY   Patient  satisfied with Plan of action and management. All questions answered  Follow up 3 to 6 months  Total Time Spent 35 mins   [address] Wall[address] Bamberg, M.D.  [facility]  Medical Director [facility] Medical Director [facility] Cardio[facility] Department

## 2021-10-29 ENCOUNTER — Ambulatory Visit (INDEPENDENT_AMBULATORY_CARE_PROVIDER_SITE_OTHER): Payer: 59 | Admitting: Internal Medicine

## 2021-10-29 ENCOUNTER — Other Ambulatory Visit: Payer: Self-pay

## 2021-10-29 VITALS — BP 134/76 | HR 86 | Temp 97.9°F | Resp 16 | Ht 70.0 in | Wt 235.0 lb

## 2021-10-29 DIAGNOSIS — I10 Essential (primary) hypertension: Secondary | ICD-10-CM

## 2021-10-29 DIAGNOSIS — E7800 Pure hypercholesterolemia, unspecified: Secondary | ICD-10-CM

## 2021-10-29 DIAGNOSIS — K219 Gastro-esophageal reflux disease without esophagitis: Secondary | ICD-10-CM | POA: Diagnosis not present

## 2021-10-29 DIAGNOSIS — R197 Diarrhea, unspecified: Secondary | ICD-10-CM

## 2021-10-29 DIAGNOSIS — E1165 Type 2 diabetes mellitus with hyperglycemia: Secondary | ICD-10-CM

## 2021-10-29 DIAGNOSIS — Z114 Encounter for screening for human immunodeficiency virus [HIV]: Secondary | ICD-10-CM

## 2021-10-29 DIAGNOSIS — D649 Anemia, unspecified: Secondary | ICD-10-CM

## 2021-10-29 DIAGNOSIS — Z1159 Encounter for screening for other viral diseases: Secondary | ICD-10-CM

## 2021-10-29 MED ORDER — METFORMIN HCL ER 500 MG PO TB24: ORAL_TABLET | ORAL | 2 refills | Status: DC

## 2021-10-29 NOTE — Progress Notes (Signed)
Patient ID: Aaron Headland., male   DOB: [birth year], 56 y.o.   MRN: [id number]   Subjective:    Patient ID: Aaron Headland., male    DOB: 1966-03-22, 56 y.o.   MRN: [id number]  This visit occurred during the SARS-CoV-2 public health emergency.  Safety protocols were in place, including screening questions prior to the visit, additional usage of staff PPE, and extensive cleaning of exam room while observing appropriate contact time as indicated for disinfecting solutions.   Patient here for a scheduled follow up.   Chief Complaint  Patient presents with   Hypertension   .   HPI Here to follow up regarding blood pressure, blood sugar and cholesterol.  Reviewed blood pressure readings - average 120-130s/70-80s.  Tolerating coreg.  No chest pain or sob reported.  No cough or congestion.  No abdominal pain.  Does report loose stools - 2-3x/week.  No fever.  No blood.  Discussed metformin.  Diet and exercise.  Headaches better.     Past Medical History:  Diagnosis Date   GERD (gastroesophageal reflux disease)    Hypercholesterolemia    Hyperglycemia    Hypertension    Iron deficiency anemia    Past Surgical History:  Procedure Laterality Date   INGUINAL HERNIA REPAIR     Family History  Problem Relation Age of Onset   Hypertension Mother    Hypertension Father    CVA Paternal Grandfather    Colon cancer Neg Hx    Prostate cancer Neg Hx    Social History   Socioeconomic History   Marital status: Married    Spouse name: Not on file   Number of children: 2   Years of education: Not on file   Highest education level: Not on file  Occupational History   Not on file  Tobacco Use   Smoking status: Never   Smokeless tobacco: Never  Vaping Use   Vaping Use: Never used  Substance and Sexual Activity   Alcohol use: No    Alcohol/week: 0.0 standard drinks   Drug use: No   Sexual activity: Not on file  Other Topics Concern   Not on file  Social History Narrative   ** Merged  History Encounter **       Social Determinants of Health   Financial Resource Strain: Low Risk    Difficulty of Paying Living Expenses: Not hard at all  Food Insecurity: Not on file  Transportation Needs: Not on file  Physical Activity: Not on file  Stress: Not on file  Social Connections: Not on file     Review of Systems  Constitutional:  Negative for appetite change and unexpected weight change.  HENT:  Negative for congestion and sinus pressure.   Respiratory:  Negative for cough, chest tightness and shortness of breath.   Cardiovascular:  Negative for chest pain, palpitations and leg swelling.  Gastrointestinal:  Negative for abdominal pain, diarrhea, nausea and vomiting.  Genitourinary:  Negative for difficulty urinating and dysuria.  Musculoskeletal:  Negative for joint swelling and myalgias.  Skin:  Negative for color change and rash.  Neurological:  Negative for dizziness, light-headedness and headaches.  Psychiatric/Behavioral:  Negative for agitation and dysphoric mood.       Objective:     BP 134/76    Pulse 86    Temp 97.9 F (36.6 C)    Resp 16    Ht 5' 10"  (1.778 m)    Wt 235 lb (106.6 kg)  SpO2 99%    BMI 33.72 kg/m  Wt Readings from Last 3 Encounters:  10/29/21 235 lb (106.6 kg)  10/17/21 234 lb 3.2 oz (106.2 kg)  08/06/21 233 lb (105.7 kg)    Physical Exam Constitutional:      General: He is not in acute distress.    Appearance: Normal appearance. He is well-developed.  HENT:     Head: Normocephalic and atraumatic.     Right Ear: External ear normal.     Left Ear: External ear normal.  Eyes:     General: No scleral icterus.       Right eye: No discharge.        Left eye: No discharge.  Cardiovascular:     Rate and Rhythm: Normal rate and regular rhythm.  Pulmonary:     Effort: Pulmonary effort is normal. No respiratory distress.     Breath sounds: Normal breath sounds.  Abdominal:     General: Bowel sounds are normal.     Palpations:  Abdomen is soft.     Tenderness: There is no abdominal tenderness.  Musculoskeletal:        General: No swelling or tenderness.     Cervical back: Neck supple. No tenderness.  Lymphadenopathy:     Cervical: No cervical adenopathy.  Skin:    Findings: No erythema or rash.  Neurological:     Mental Status: He is alert.  Psychiatric:        Mood and Affect: Mood normal.        Behavior: Behavior normal.     Outpatient Encounter Medications as of 10/29/2021  Medication Sig   metFORMIN (GLUCOPHAGE XR) 500 MG 24 hr tablet 2 tablets bid   aspirin 81 MG tablet Take 81 mg by mouth daily.   atorvastatin (LIPITOR) 40 MG tablet Take 1 tablet (40 mg total) by mouth daily.   COREG 12.5 MG tablet TAKE 1 TABLET TWICE DAILY  WITH MEALS   Lancets (ACCU-CHEK SOFT TOUCH) lancets Use as instructed   losartan-hydrochlorothiazide (HYZAAR) 100-12.5 MG tablet Take 1 tablet by mouth daily.   meloxicam (MOBIC) 15 MG tablet Take 1 tablet (15 mg total) by mouth daily. (Patient taking differently: Take 15 mg by mouth as needed.)   omeprazole (PRILOSEC) 40 MG capsule Take 1 capsule (40 mg total) by mouth daily.   tamsulosin (FLOMAX) 0.4 MG CAPS capsule TAKE 1 CAPSULE DAILY AFTER SUPPER   [DISCONTINUED] metFORMIN (GLUCOPHAGE) 1000 MG tablet Take 1 tablet (1,000 mg total) by mouth 2 (two) times daily with a meal.   No facility-administered encounter medications on file as of 10/29/2021.     Lab Results  Component Value Date   WBC 8.1 06/25/2021   HGB 13.9 06/25/2021   HCT 40.6 06/25/2021   PLT 300.0 06/25/2021   GLUCOSE 102 (H) 06/25/2021   CHOL 128 06/25/2021   TRIG 70.0 06/25/2021   HDL 43.50 06/25/2021   LDLCALC 71 06/25/2021   ALT 14 06/25/2021   AST 12 06/25/2021   NA 139 06/25/2021   K 3.8 06/25/2021   CL 101 06/25/2021   CREATININE 0.91 06/25/2021   BUN 13 06/25/2021   CO2 31 06/25/2021   TSH 3.16 06/25/2021   PSA 1.74 06/25/2021   HGBA1C 6.9 (H) 06/25/2021   MICROALBUR 0.7 02/03/2020        Assessment & Plan:   Problem List Items Addressed This Visit     Anemia    Follow cbc.       Diabetes  mellitus ([place])    Low carb diet and exercise. Reports loose stool as outlined.  Will change metformin to XR.  See if diarrhea improves.  Call with update.   Follow met b and a1c.        Relevant Medications   metFORMIN (GLUCOPHAGE XR) 500 MG 24 hr tablet   Diarrhea    Persistent loose stool as outlined.  Will change metformin to XR.  See if diarrhea improves.  Call with update.       GERD (gastroesophageal reflux disease)    No upper symptoms reported.  On omeprazole.        Hypercholesterolemia    On lipitor.  Low cholesterol diet and exercise.  Follow lipid panel and liver function tests.        Hypertension    Blood pressure doing better as outlined.  Continue hyzaar and carvedilol 12/.39m bid.  Follow pressures.  Follow metabolic panel.        Other Visit Diagnoses     Encounter for hepatitis C screening test for low risk patient    -  Primary   Relevant Orders   Hepatitis C Antibody   Screening for HIV without presence of risk factors       Relevant Orders   HIV Antibody (routine testing w rflx)        CEinar Pheasant MD

## 2021-10-29 NOTE — Patient Instructions (Signed)
Stop metformin 1000mg    Start metformin XR 500mg  - take two tablets twice a day.   Let me know if the diarrhea continues.   Continue to monitor your blood pressures.

## 2021-11-05 ENCOUNTER — Encounter: Payer: Self-pay | Admitting: Internal Medicine

## 2021-11-05 NOTE — Assessment & Plan Note (Signed)
Persistent loose stool as outlined.  Will change metformin to XR.  See if diarrhea improves.  Call with update.

## 2021-11-05 NOTE — Assessment & Plan Note (Signed)
Blood pressure doing better as outlined.  Continue hyzaar and carvedilol 12/.5mg  bid.  Follow pressures.  Follow metabolic panel.

## 2021-11-05 NOTE — Assessment & Plan Note (Signed)
No upper symptoms reported.  On omeprazole.  

## 2021-11-05 NOTE — Assessment & Plan Note (Signed)
On lipitor.  Low cholesterol diet and exercise.  Follow lipid panel and liver function tests.   

## 2021-11-05 NOTE — Assessment & Plan Note (Signed)
Low carb diet and exercise. Reports loose stool as outlined.  Will change metformin to XR.  See if diarrhea improves.  Call with update.   Follow met b and a1c.

## 2021-11-05 NOTE — Assessment & Plan Note (Signed)
Follow cbc.  

## 2021-11-15 ENCOUNTER — Ambulatory Visit: Payer: 59

## 2021-11-15 ENCOUNTER — Other Ambulatory Visit: Payer: Self-pay

## 2021-11-15 DIAGNOSIS — G4733 Obstructive sleep apnea (adult) (pediatric): Secondary | ICD-10-CM | POA: Diagnosis not present

## 2021-11-15 DIAGNOSIS — G4719 Other hypersomnia: Secondary | ICD-10-CM

## 2021-11-18 ENCOUNTER — Telehealth: Payer: Self-pay | Admitting: Internal Medicine

## 2021-11-18 ENCOUNTER — Other Ambulatory Visit: Payer: Self-pay

## 2021-11-18 MED ORDER — METFORMIN HCL ER 500 MG PO TB24: 1000.0000 mg | ORAL_TABLET | Freq: Two times a day (BID) | ORAL | 1 refills | Status: DC

## 2021-11-18 NOTE — Telephone Encounter (Signed)
Rx sent in

## 2021-11-18 NOTE — Telephone Encounter (Signed)
Patient like a refill on his metFORMIN (GLUCOPHAGE XR) 500 MG 24 hr tablet. Please use [facility].

## 2021-11-21 ENCOUNTER — Telehealth: Payer: Self-pay | Admitting: Internal Medicine

## 2021-11-21 DIAGNOSIS — G4733 Obstructive sleep apnea (adult) (pediatric): Secondary | ICD-10-CM | POA: Diagnosis not present

## 2021-11-21 NOTE — Telephone Encounter (Signed)
Dr Sood has read pt's home sleep study and it has been scanned to the chart.  

## 2021-11-25 NOTE — Telephone Encounter (Signed)
Spoke to patient and relayed below results. He voiced his understanding.  He would like to discuss results further at his upcoming visit 11/29/2021. Nothing further needed at this time.

## 2021-11-28 ENCOUNTER — Other Ambulatory Visit: Payer: Self-pay

## 2021-11-28 ENCOUNTER — Other Ambulatory Visit (INDEPENDENT_AMBULATORY_CARE_PROVIDER_SITE_OTHER): Payer: 59

## 2021-11-28 DIAGNOSIS — Z114 Encounter for screening for human immunodeficiency virus [HIV]: Secondary | ICD-10-CM

## 2021-11-28 DIAGNOSIS — E7800 Pure hypercholesterolemia, unspecified: Secondary | ICD-10-CM

## 2021-11-28 DIAGNOSIS — E1165 Type 2 diabetes mellitus with hyperglycemia: Secondary | ICD-10-CM | POA: Diagnosis not present

## 2021-11-28 DIAGNOSIS — I10 Essential (primary) hypertension: Secondary | ICD-10-CM | POA: Diagnosis not present

## 2021-11-28 DIAGNOSIS — Z1159 Encounter for screening for other viral diseases: Secondary | ICD-10-CM | POA: Diagnosis not present

## 2021-11-28 LAB — BASIC METABOLIC PANEL
BUN: 15 mg/dL (ref 6–23)
CO2: 29 mEq/L (ref 19–32)
Calcium: 8.9 mg/dL (ref 8.4–10.5)
Chloride: 99 mEq/L (ref 96–112)
Creatinine, Ser: 1.02 mg/dL (ref 0.40–1.50)
GFR: 82.12 mL/min (ref >60.00)
Glucose, Bld: 111 mg/dL — ABNORMAL HIGH (ref 70–99)
Potassium: 3.7 mEq/L (ref 3.5–5.1)
Sodium: 136 mEq/L (ref 135–145)

## 2021-11-28 LAB — HEPATIC FUNCTION PANEL
ALT: 16 U/L (ref 0–53)
AST: 15 U/L (ref 0–37)
Albumin: 3.9 g/dL (ref 3.5–5.2)
Alkaline Phosphatase: 73 U/L (ref 39–117)
Bilirubin, Direct: 0.1 mg/dL (ref 0.0–0.3)
Total Bilirubin: 0.8 mg/dL (ref 0.2–1.2)
Total Protein: 6.9 g/dL (ref 6.0–8.3)

## 2021-11-28 LAB — LIPID PANEL
Cholesterol: 132 mg/dL (ref 0–200)
HDL: 41.8 mg/dL (ref >39.00)
LDL Cholesterol: 69 mg/dL (ref 0–99)
NonHDL: 89.81
Total CHOL/HDL Ratio: 3
Triglycerides: 105 mg/dL (ref 0.0–149.0)
VLDL: 21 mg/dL (ref 0.0–40.0)

## 2021-11-28 LAB — HEMOGLOBIN A1C: Hgb A1c MFr Bld: 7.1 % — ABNORMAL HIGH (ref 4.6–6.5)

## 2021-11-29 ENCOUNTER — Encounter: Payer: Self-pay | Admitting: Nurse Practitioner

## 2021-11-29 ENCOUNTER — Telehealth (INDEPENDENT_AMBULATORY_CARE_PROVIDER_SITE_OTHER): Payer: 59 | Admitting: Nurse Practitioner

## 2021-11-29 DIAGNOSIS — G4733 Obstructive sleep apnea (adult) (pediatric): Secondary | ICD-10-CM

## 2021-11-29 DIAGNOSIS — I10 Essential (primary) hypertension: Secondary | ICD-10-CM | POA: Diagnosis not present

## 2021-11-29 DIAGNOSIS — R0683 Snoring: Secondary | ICD-10-CM | POA: Diagnosis not present

## 2021-11-29 LAB — HEPATITIS C ANTIBODY
Hepatitis C Ab: NONREACTIVE
SIGNAL TO CUT-OFF: 0.05 (ref <1.00)

## 2021-11-29 LAB — HIV ANTIBODY (ROUTINE TESTING W REFLEX): HIV 1&2 Ab, 4th Generation: NONREACTIVE

## 2021-11-29 NOTE — Assessment & Plan Note (Addendum)
Very mild OSA on recent HST with AHI 5.2. Pt has very minimal fatigue symptoms. We discussed how untreated sleep apnea puts an individual at risk for cardiac arrhthymias, pulm HTN, DM, stroke and increases their risk for daytime accidents; although, these risks are limited given his borderline OSA. We also briefly reviewed treatment options including weight loss, side sleeping position, oral appliance, CPAP therapy or referral to ENT for possible surgical options. Pt opted for side sleeping position. Healthy weight management was discussed as well.  ?

## 2021-11-29 NOTE — Progress Notes (Signed)
? ?Patient ID: Aaron Allison., male     DOB: [birth year], 56 y.o.      MRN: [id number] ? ?Chief Complaint  ?Patient presents with  ? Follow-up  ? ? ?Virtual Visit via Video Note ? ?I connected with Aaron Allison. on 11/29/21 at  9:00 AM EST by a video enabled telemedicine application and verified that I am speaking with the correct person using two identifiers. ? ?Location: ?Patient: Home ?Provider: Office ?  ?I discussed the limitations of evaluation and management by telemedicine and the availability of in person appointments. The patient expressed understanding and agreed to proceed. ? ?History of Present Illness: ?56 year old male, never smoker followed for mild obstructive sleep apnea. He is a patient of Dr. Zoila Shutter and last seen in office on 10/17/2021. Past medical history significant for HTN, GERD, DM, BPH, HLD.  ? ?11/29/2021: Today - HST follow up ?Patient presents today via virtual visit to discuss home sleep study results. His HST showed very mild obstructive sleep apnea with a AHI 5.2. He reported that he primarily did the test to ensure that untreated sleep apnea was not affecting his heart and causing increased BPs. He works third shift and doesn't have any issues sleeping afterwards. He has occasional fatigue but nothing that is unmanageable in his opinion. He gets around 6-7 hours of continuous sleep per day. He does note that he has been told he snores at night. He denies any drowsy driving, morning headaches or narcolepsy. Overall, he feels well and offers no complaints.  ? ?Epworth 5 ? ?No Known Allergies ?Immunization History  ?Administered Date(s) Administered  ? Influenza-Unspecified 06/29/2018  ? Moderna Sars-Covid-2 Vaccination 12/31/2019, 01/25/2020, 09/17/2020  ? ?Past Medical History:  ?Diagnosis Date  ? GERD (gastroesophageal reflux disease)   ? Hypercholesterolemia   ? Hyperglycemia   ? Hypertension   ? Iron deficiency anemia   ? ? ?Tobacco History: ?Social History  ? ?Tobacco Use   ?Smoking Status Never  ?Smokeless Tobacco Never  ? ?Counseling given: Not Answered ? ? ?Outpatient Medications Prior to Visit  ?Medication Sig Dispense Refill  ? aspirin 81 MG tablet Take 81 mg by mouth daily.    ? atorvastatin (LIPITOR) 40 MG tablet Take 1 tablet (40 mg total) by mouth daily. 90 tablet 3  ? COREG 12.5 MG tablet TAKE 1 TABLET TWICE DAILY  WITH MEALS 180 tablet 0  ? Lancets (ACCU-CHEK SOFT TOUCH) lancets Use as instructed 100 each 5  ? losartan-hydrochlorothiazide (HYZAAR) 100-12.5 MG tablet Take 1 tablet by mouth daily. 90 tablet 3  ? metFORMIN (GLUCOPHAGE XR) 500 MG 24 hr tablet Take 2 tablets (1,000 mg total) by mouth 2 (two) times daily. 360 tablet 1  ? omeprazole (PRILOSEC) 40 MG capsule Take 1 capsule (40 mg total) by mouth daily. 90 capsule 3  ? tamsulosin (FLOMAX) 0.4 MG CAPS capsule TAKE 1 CAPSULE DAILY AFTER SUPPER 90 capsule 3  ? meloxicam (MOBIC) 15 MG tablet Take 1 tablet (15 mg total) by mouth daily. (Patient not taking: Reported on 11/29/2021) 30 tablet 1  ? ?No facility-administered medications prior to visit.  ? ?  ?Review of Systems:  ? ?Constitutional: No weight loss or gain, night sweats, fevers, chills. +occasional minimal daytime fatigue ?HEENT: No headaches, difficulty swallowing, tooth/dental problems, or sore throat. No sneezing, itching, ear ache, nasal congestion, or post nasal drip ?CV:  No chest pain, orthopnea, PND, swelling in lower extremities, anasarca, dizziness, palpitations, syncope ?Resp: No shortness of breath  with exertion or at rest. No excess mucus or change in color of mucus. No productive or non-productive. No hemoptysis. No wheezing.  No chest wall deformity ?GI:  No heartburn, indigestion, abdominal pain, nausea, vomiting, diarrhea, change in bowel habits, loss of appetite, bloody stools.  ?GU: No dysuria, change in color of urine, urgency or frequency.  No flank pain, no hematuria  ?Skin: No rash, lesions, ulcerations ?MSK:  No joint pain or swelling.  No  decreased range of motion.  No back pain. ?Neuro: No dizziness or lightheadedness.  ?Psych: No depression or anxiety. Mood stable.  ? ?Observations/Objective: ?Patient is well-developed, well-nourished in no acute distress. A&Ox3. Resting comfortably at home. Unlabored, regular breathing. Speech is clear and coherent with logical content without any difficulties. ? ?11/15/2021 HST: AHI 5.2/hr, SpO2 low 84% average 93% ? ? ?Assessment and Plan: ?Mild obstructive sleep apnea ?Very mild OSA on recent HST with AHI 5.2. Pt has very minimal fatigue symptoms. We discussed how untreated sleep apnea puts an individual at risk for cardiac arrhthymias, pulm HTN, DM, stroke and increases their risk for daytime accidents; although, these risks are limited given his borderline OSA. We also briefly reviewed treatment options including weight loss, side sleeping position, oral appliance, CPAP therapy or referral to ENT for possible surgical options. Pt opted for side sleeping position. Healthy weight management was discussed as well.  ? ?Hypertension ?Working with PCP regarding uncontrolled HTN. Adjusted DM meds, which he believes has helped. Follow up with PCP as scheduled.  ? ?Snoring ?Occasional loud snoring. No other problems with sleep. Breathing stable otherwise.  ? ? ? ?Follow Up Instructions: Follow up with Dr. Mortimer Fries as needed or if you decide you want to try other treatment options. If symptoms do not improve or worsen, please contact office for sooner follow up or seek emergency care. ? ?  ?I discussed the assessment and treatment plan with the patient. The patient was provided an opportunity to ask questions and all were answered. The patient agreed with the plan and demonstrated an understanding of the instructions. ?  ?The patient was advised to call back or seek an in-person evaluation if the symptoms worsen or if the condition fails to improve as anticipated. ? ?I provided 25 minutes of non-face-to-face time during  this encounter. ? ? ?Clayton Bibles, NP  ? ?

## 2021-11-29 NOTE — Assessment & Plan Note (Signed)
Occasional loud snoring. No other problems with sleep. Breathing stable otherwise.  ?

## 2021-11-29 NOTE — Assessment & Plan Note (Signed)
Working with PCP regarding uncontrolled HTN. Adjusted DM meds, which he believes has helped. Follow up with PCP as scheduled.  ?

## 2021-11-29 NOTE — Patient Instructions (Signed)
You have very mild obstructive sleep apnea. Utilize side lying sleeping position with pillow behind your back at night.  ?Healthy weight management discussed.  ?Notify us if you would like to try other interventions such as oral appliance or CPAP. ? ?Follow up with Dr. Belia Heman as needed. If symptoms do not improve or worsen, please contact office for sooner follow up or seek emergency care. ?

## 2021-12-31 ENCOUNTER — Telehealth (INDEPENDENT_AMBULATORY_CARE_PROVIDER_SITE_OTHER): Payer: 59 | Admitting: Internal Medicine

## 2021-12-31 ENCOUNTER — Encounter: Payer: Self-pay | Admitting: Internal Medicine

## 2021-12-31 DIAGNOSIS — I10 Essential (primary) hypertension: Secondary | ICD-10-CM | POA: Diagnosis not present

## 2021-12-31 DIAGNOSIS — K219 Gastro-esophageal reflux disease without esophagitis: Secondary | ICD-10-CM

## 2021-12-31 DIAGNOSIS — E7800 Pure hypercholesterolemia, unspecified: Secondary | ICD-10-CM | POA: Diagnosis not present

## 2021-12-31 DIAGNOSIS — E1165 Type 2 diabetes mellitus with hyperglycemia: Secondary | ICD-10-CM | POA: Diagnosis not present

## 2021-12-31 MED ORDER — EMPAGLIFLOZIN 10 MG PO TABS: 10.0000 mg | ORAL_TABLET | Freq: Every day | ORAL | 2 refills | Status: DC

## 2021-12-31 NOTE — Progress Notes (Signed)
Patient ID: Aaron Headland., male   DOB: 29-Jun-1966, 56 y.o.   MRN: [id number] ? ? ?Virtual Visit via video Note ? ?This visit type was conducted due to national recommendations for restrictions regarding the COVID-19 pandemic (e.g. social distancing).  This format is felt to be most appropriate for this patient at this time.  All issues noted in this document were discussed and addressed.  No physical exam was performed (except for noted visual exam findings with Video Visits).  ? ?I connected with [name] by a video enabled telemedicine application and verified that I am speaking with the correct person using two identifiers. ?Location patient: home ?Location provider: work ?Persons participating in the virtual visit: patient, provider ? ?The limitations, risks, security and privacy concerns of performing an evaluation and management service by video and the availability of in person appointments have been discussed. It has also been discussed with the patient that there may be a patient responsible charge related to this service. The patient expressed understanding and agreed to proceed. ? ? ?Reason for visit: work:  work in [employer].  ? ?HPI: ?Work in to discuss diabetes and medication adjustment.  A1c 7.1.  discussed low carb diet and exercise.  Stays active.  No chest pain or sob reported.  No abdominal pain reported.  Bowels better since changing to extended release metformin.  Discussed treatment options.  Desires not to do injections.   ? ? ?ROS: See pertinent positives and negatives per HPI. ? ?Past Medical History:  ?Diagnosis Date  ? GERD (gastroesophageal reflux disease)   ? Hypercholesterolemia   ? Hyperglycemia   ? Hypertension   ? Iron deficiency anemia   ? ? ?Past Surgical History:  ?Procedure Laterality Date  ? INGUINAL HERNIA REPAIR    ? ? ?Family History  ?Problem Relation Age of Onset  ? Hypertension Mother   ? Hypertension Father   ? CVA Paternal Grandfather   ? Colon cancer Neg Hx   ? Prostate  cancer Neg Hx   ? ? ?SOCIAL HX: reviewed.  ? ? ?Current Outpatient Medications:  ?  aspirin 81 MG tablet, Take 81 mg by mouth daily., Disp: , Rfl:  ?  atorvastatin (LIPITOR) 40 MG tablet, Take 1 tablet (40 mg total) by mouth daily., Disp: 90 tablet, Rfl: 3 ?  COREG 12.5 MG tablet, TAKE 1 TABLET TWICE DAILY  WITH MEALS, Disp: 180 tablet, Rfl: 0 ?  empagliflozin (JARDIANCE) 10 MG TABS tablet, Take 1 tablet (10 mg total) by mouth daily before breakfast., Disp: 30 tablet, Rfl: 2 ?  losartan-hydrochlorothiazide (HYZAAR) 100-12.5 MG tablet, Take 1 tablet by mouth daily., Disp: 90 tablet, Rfl: 3 ?  metFORMIN (GLUCOPHAGE XR) 500 MG 24 hr tablet, Take 2 tablets (1,000 mg total) by mouth 2 (two) times daily., Disp: 360 tablet, Rfl: 1 ?  omeprazole (PRILOSEC) 40 MG capsule, Take 1 capsule (40 mg total) by mouth daily., Disp: 90 capsule, Rfl: 3 ?  tamsulosin (FLOMAX) 0.4 MG CAPS capsule, TAKE 1 CAPSULE DAILY AFTER SUPPER, Disp: 90 capsule, Rfl: 3 ? ?EXAM: ? ?GENERAL: alert, oriented, appears well and in no acute distress ? ?HEENT: atraumatic, conjunttiva clear, no obvious abnormalities on inspection of external nose and ears ? ?NECK: normal movements of the head and neck ? ?LUNGS: on inspection no signs of respiratory distress, breathing rate appears normal, no obvious gross SOB, gasping or wheezing ? ?CV: no obvious cyanosis ? ?PSYCH/NEURO: pleasant and cooperative, no obvious depression or anxiety, speech and thought  processing grossly intact ? ?ASSESSMENT AND PLAN: ? ?Discussed the following assessment and plan: ? ?Problem List Items Addressed This Visit   ? ? Diabetes mellitus ([place])  ?  Low carb diet and exercise. Changed metformin to XR. Loose stools improved.  Discussed treatment options given elevated a1c.  Desires no injections.  Start jardiance.  Discussed possible side effects.  Discussed holding if sick.  Follow met b and a1c.   ?  ?  ? Relevant Medications  ? empagliflozin (JARDIANCE) 10 MG TABS tablet  ? GERD  (gastroesophageal reflux disease)  ?  No upper symptoms reported.  On omeprazole.   ?  ?  ? Hypercholesterolemia  ?  On lipitor.  Low cholesterol diet and exercise.  Follow lipid panel and liver function tests.   ?  ?  ? Hypertension  ?  On losartan/hctz and coreg.  Start jardiance.  Hold on additional blood pressure medication.  Follow pressures.  Follow metabolic panel  ?  ?  ? ? ?Return if symptoms worsen or fail to improve, for keep scheduled. ?  ?I discussed the assessment and treatment plan with the patient. The patient was provided an opportunity to ask questions and all were answered. The patient agreed with the plan and demonstrated an understanding of the instructions. ?  ?The patient was advised to call back or seek an in-person evaluation if the symptoms worsen or if the condition fails to improve as anticipated. ? ? ? ?Einar Pheasant, MD   ?

## 2022-01-02 ENCOUNTER — Telehealth: Payer: Self-pay | Admitting: Internal Medicine

## 2022-01-02 NOTE — Telephone Encounter (Signed)
Pt spouse called about medication refill omeprazole. Pt spouse does not know if he needs a PA or not  ?

## 2022-01-04 ENCOUNTER — Encounter: Payer: Self-pay | Admitting: Internal Medicine

## 2022-01-04 NOTE — Assessment & Plan Note (Signed)
Low carb diet and exercise. Changed metformin to XR. Loose stools improved.  Discussed treatment options given elevated a1c.  Desires no injections.  Start jardiance.  Discussed possible side effects.  Discussed holding if sick.  Follow met b and a1c.   ?

## 2022-01-04 NOTE — Assessment & Plan Note (Signed)
No upper symptoms reported.  On omeprazole.  

## 2022-01-04 NOTE — Assessment & Plan Note (Signed)
On lipitor.  Low cholesterol diet and exercise.  Follow lipid panel and liver function tests.   

## 2022-01-04 NOTE — Assessment & Plan Note (Signed)
On losartan/hctz and coreg.  Start jardiance.  Hold on additional blood pressure medication.  Follow pressures.  Follow metabolic panel  

## 2022-01-06 ENCOUNTER — Telehealth: Payer: Self-pay

## 2022-01-06 ENCOUNTER — Other Ambulatory Visit: Payer: Self-pay

## 2022-01-06 MED ORDER — OMEPRAZOLE 40 MG PO CPDR: 40.0000 mg | DELAYED_RELEASE_CAPSULE | Freq: Every day | ORAL | 3 refills | Status: DC

## 2022-01-06 NOTE — Telephone Encounter (Signed)
Abriel Mohrmann Key: [id number] - PA Case ID: [id number]  ?Status : Sent to Plan today ?Drug : Omeprazole 40MG  dr capsules ?Form : PA Form [phone] NCPDP)  ?  ?  ?Note   ? ?(7654 D routed conversation to You 4 days ago  ? ?Alphonzo Dublin D 4 days ago  ? ?AD ?Pt spouse called about medication refill omeprazole. Pt spouse does not know if he needs a PA or not  ?  ? ?

## 2022-01-06 NOTE — Telephone Encounter (Signed)
Aaron Allison Key: [id number] - PA Case ID: [id number]  ?Status : Sent to Plan today ?Drug : Omeprazole 40MG  dr capsules ?Form : [employer] PA Form [phone] NCPDP) ?

## 2022-01-08 ENCOUNTER — Telehealth: Payer: Self-pay

## 2022-01-08 NOTE — Telephone Encounter (Signed)
Bunyan Arakelian Key: [id number]  ? ?Outcome ?Approved ?Your PA request has been approved.  ? ?Drug ?Omeprazole 40MG  dr capsules ? ?Form ?[facility] Electronic PA Form [phone] NCPDP) ?

## 2022-01-10 ENCOUNTER — Other Ambulatory Visit: Payer: Self-pay

## 2022-01-10 MED ORDER — OMEPRAZOLE 40 MG PO CPDR: 40.0000 mg | DELAYED_RELEASE_CAPSULE | Freq: Every day | ORAL | 3 refills | Status: DC

## 2022-01-10 NOTE — Telephone Encounter (Signed)
Pt wife called in stating that our office had called there phone and she is returning our call... Advise pt wife of below note... Pt wife state that the medication (omeprazole (PRILOSEC) 40 MG capsule) is suppose to [facility]... pt is requesting for medication to be sent to [facility] because that's where all of pt medication is at... Pt requesting callback ?

## 2022-01-10 NOTE — Telephone Encounter (Signed)
Omeprazole sent to [facility] ? ?LM for pt to cb ?

## 2022-01-13 ENCOUNTER — Encounter: Payer: Self-pay | Admitting: Internal Medicine

## 2022-01-13 ENCOUNTER — Ambulatory Visit (INDEPENDENT_AMBULATORY_CARE_PROVIDER_SITE_OTHER): Payer: 59 | Admitting: Internal Medicine

## 2022-01-13 DIAGNOSIS — E7800 Pure hypercholesterolemia, unspecified: Secondary | ICD-10-CM

## 2022-01-13 DIAGNOSIS — K219 Gastro-esophageal reflux disease without esophagitis: Secondary | ICD-10-CM | POA: Diagnosis not present

## 2022-01-13 DIAGNOSIS — D649 Anemia, unspecified: Secondary | ICD-10-CM

## 2022-01-13 DIAGNOSIS — E1165 Type 2 diabetes mellitus with hyperglycemia: Secondary | ICD-10-CM

## 2022-01-13 DIAGNOSIS — I10 Essential (primary) hypertension: Secondary | ICD-10-CM | POA: Diagnosis not present

## 2022-01-13 MED ORDER — MUPIROCIN 2 % EX OINT: 1.0000 "application " | TOPICAL_OINTMENT | Freq: Two times a day (BID) | CUTANEOUS | 0 refills | Status: DC

## 2022-01-13 NOTE — Progress Notes (Signed)
Patient ID: Aaron Headland., male   DOB: 1966-03-15, 56 y.o.   MRN: [id number] ? ? ?Subjective:  ? ? Patient ID: Aaron Headland., male    DOB: 1966/03/23, 56 y.o.   MRN: [id number] ? ?This visit occurred during the SARS-CoV-2 public health emergency.  Safety protocols were in place, including screening questions prior to the visit, additional usage of staff PPE, and extensive cleaning of exam room while observing appropriate contact time as indicated for disinfecting solutions.  ? ?Patient here for a scheduled follow up.  ? ?Chief Complaint  ?Patient presents with  ? Follow-up  ?  F/u for D.M  - pt started on Jardiance. Pt reports doing well so far on medication.  ? .  ? ?HPI ?Recently started on jardiance.  A1c had remained 7.1.  he is tolerating.  Blood sugars doing better.  Just started after Easter.  No chest pain or sob reported.  No abdominal pain.  Bowels moving.  Outside blood pressures reviewed.  Still mostly in 130s/80s.  Taking coreg, losartan/hctz and now on jardiance for his sugars.   ? ? ?Past Medical History:  ?Diagnosis Date  ? GERD (gastroesophageal reflux disease)   ? Hypercholesterolemia   ? Hyperglycemia   ? Hypertension   ? Iron deficiency anemia   ? ?Past Surgical History:  ?Procedure Laterality Date  ? INGUINAL HERNIA REPAIR    ? ?Family History  ?Problem Relation Age of Onset  ? Hypertension Mother   ? Hypertension Father   ? CVA Paternal Grandfather   ? Colon cancer Neg Hx   ? Prostate cancer Neg Hx   ? ?Social History  ? ?Socioeconomic History  ? Marital status: Married  ?  Spouse name: Not on file  ? Number of children: 2  ? Years of education: Not on file  ? Highest education level: Not on file  ?Occupational History  ? Not on file  ?Tobacco Use  ? Smoking status: Never  ? Smokeless tobacco: Never  ?Vaping Use  ? Vaping Use: Never used  ?Substance and Sexual Activity  ? Alcohol use: No  ?  Alcohol/week: 0.0 standard drinks  ? Drug use: No  ? Sexual activity: Not on file  ?Other Topics  Concern  ? Not on file  ?Social History Narrative  ? ** Merged History Encounter **  ?    ? ?Social Determinants of Health  ? ?Financial Resource Strain: Not on file  ?Food Insecurity: Not on file  ?Transportation Needs: Not on file  ?Physical Activity: Not on file  ?Stress: Not on file  ?Social Connections: Not on file  ? ? ? ?Review of Systems  ?Constitutional:  Negative for appetite change and unexpected weight change.  ?HENT:  Negative for sinus pressure.   ?Respiratory:  Negative for cough, chest tightness and shortness of breath.   ?Cardiovascular:  Negative for chest pain, palpitations and leg swelling.  ?Gastrointestinal:  Negative for abdominal pain, diarrhea, nausea and vomiting.  ?Genitourinary:  Negative for difficulty urinating and dysuria.  ?Musculoskeletal:  Negative for joint swelling and myalgias.  ?Skin:  Negative for color change and rash.  ?Neurological:  Negative for dizziness, light-headedness and headaches.  ?Psychiatric/Behavioral:  Negative for agitation and dysphoric mood.   ? ?   ?Objective:  ?  ? ?BP 122/70 (BP Location: Left Arm, Patient Position: Sitting, Cuff Size: Large)   Pulse 75   Temp 97.9 ?F (36.6 ?C) (Temporal)   Resp (!) 21   Ht 5'  10" (1.778 m)   Wt 230 lb (104.3 kg)   SpO2 99%   BMI 33.00 kg/m?  ?Wt Readings from Last 3 Encounters:  ?01/13/22 230 lb (104.3 kg)  ?10/29/21 235 lb (106.6 kg)  ?10/17/21 234 lb 3.2 oz (106.2 kg)  ? ? ?Physical Exam ?Constitutional:   ?   General: He is not in acute distress. ?   Appearance: Normal appearance. He is well-developed.  ?HENT:  ?   Head: Normocephalic and atraumatic.  ?   Right Ear: External ear normal.  ?   Left Ear: External ear normal.  ?Eyes:  ?   General: No scleral icterus.    ?   Right eye: No discharge.     ?   Left eye: No discharge.  ?Cardiovascular:  ?   Rate and Rhythm: Normal rate and regular rhythm.  ?Pulmonary:  ?   Effort: Pulmonary effort is normal. No respiratory distress.  ?   Breath sounds: Normal breath  sounds.  ?Abdominal:  ?   General: Bowel sounds are normal.  ?   Palpations: Abdomen is soft.  ?   Tenderness: There is no abdominal tenderness.  ?Musculoskeletal:     ?   General: No swelling or tenderness.  ?   Cervical back: Neck supple. No tenderness.  ?Lymphadenopathy:  ?   Cervical: No cervical adenopathy.  ?Skin: ?   Findings: No erythema or rash.  ?Neurological:  ?   Mental Status: He is alert.  ?Psychiatric:     ?   Mood and Affect: Mood normal.     ?   Behavior: Behavior normal.  ? ? ? ?Outpatient Encounter Medications as of 01/13/2022  ?Medication Sig  ? aspirin 81 MG tablet Take 81 mg by mouth daily.  ? atorvastatin (LIPITOR) 40 MG tablet Take 1 tablet (40 mg total) by mouth daily.  ? COREG 12.5 MG tablet TAKE 1 TABLET TWICE DAILY  WITH MEALS  ? empagliflozin (JARDIANCE) 10 MG TABS tablet Take 1 tablet (10 mg total) by mouth daily before breakfast.  ? losartan-hydrochlorothiazide (HYZAAR) 100-12.5 MG tablet Take 1 tablet by mouth daily.  ? metFORMIN (GLUCOPHAGE XR) 500 MG 24 hr tablet Take 2 tablets (1,000 mg total) by mouth 2 (two) times daily.  ? mupirocin ointment (BACTROBAN) 2 % Apply 1 application. topically 2 (two) times daily.  ? omeprazole (PRILOSEC) 40 MG capsule Take 1 capsule (40 mg total) by mouth daily.  ? tamsulosin (FLOMAX) 0.4 MG CAPS capsule TAKE 1 CAPSULE DAILY AFTER SUPPER  ? ?No facility-administered encounter medications on file as of 01/13/2022.  ?  ? ?Lab Results  ?Component Value Date  ? WBC 8.1 06/25/2021  ? HGB 13.9 06/25/2021  ? HCT 40.6 06/25/2021  ? PLT 300.0 06/25/2021  ? GLUCOSE 111 (H) 11/28/2021  ? CHOL 132 11/28/2021  ? TRIG 105.0 11/28/2021  ? HDL 41.80 11/28/2021  ? [place] 69 11/28/2021  ? ALT 16 11/28/2021  ? AST 15 11/28/2021  ? NA 136 11/28/2021  ? K 3.7 11/28/2021  ? CL 99 11/28/2021  ? CREATININE 1.02 11/28/2021  ? BUN 15 11/28/2021  ? CO2 29 11/28/2021  ? TSH 3.16 06/25/2021  ? PSA 1.74 06/25/2021  ? HGBA1C 7.1 (H) 11/28/2021  ? MICROALBUR 0.7 02/03/2020  ? ? ?    ?Assessment & Plan:  ? ?Problem List Items Addressed This Visit   ? ? Anemia  ?  Follow cbc.  ? ?  ?  ? Diabetes mellitus ([place])  ?  Low carb diet and exercise. Changed metformin to XR. Loose stools improved.  On jardiance now.  Tolerating.  Sugars reviewed.  Doing well. Follow met b and a1c.   ? ?  ?  ? Relevant Orders  ? Hemoglobin A1c  ? Basic metabolic panel  ? Microalbumin / creatinine urine ratio  ? GERD (gastroesophageal reflux disease)  ?  No upper symptoms reported.  On omeprazole.   ? ?  ?  ? Hypercholesterolemia  ?  On lipitor.  Low cholesterol diet and exercise.  Follow lipid panel and liver function tests.   ? ?  ?  ? Relevant Orders  ? Hepatic function panel  ? Lipid panel  ? Hypertension  ?  On losartan/hctz and coreg. Outside blood pressures reviewed.  Most averaging 130s/80s. Just started jardiance.  Hold on additional blood pressure medication.  Follow pressures.  Follow metabolic panel  ? ?  ?  ? ? ? ?Einar Pheasant, MD  ?

## 2022-01-13 NOTE — Telephone Encounter (Signed)
approved

## 2022-01-19 ENCOUNTER — Encounter: Payer: Self-pay | Admitting: Internal Medicine

## 2022-01-19 NOTE — Assessment & Plan Note (Signed)
Follow cbc.  

## 2022-01-19 NOTE — Assessment & Plan Note (Signed)
On losartan/hctz and coreg. Outside blood pressures reviewed.  Most averaging 130s/80s. Just started jardiance.  Hold on additional blood pressure medication.  Follow pressures.  Follow metabolic panel  ?

## 2022-01-19 NOTE — Assessment & Plan Note (Signed)
No upper symptoms reported.  On omeprazole.  

## 2022-01-19 NOTE — Assessment & Plan Note (Signed)
Low carb diet and exercise. Changed metformin to XR. Loose stools improved.  On jardiance now.  Tolerating.  Sugars reviewed.  Doing well. Follow met b and a1c.   ?

## 2022-01-19 NOTE — Assessment & Plan Note (Signed)
On lipitor.  Low cholesterol diet and exercise.  Follow lipid panel and liver function tests.   

## 2022-01-25 ENCOUNTER — Encounter: Payer: Self-pay | Admitting: Internal Medicine

## 2022-01-27 NOTE — Telephone Encounter (Signed)
Patient said this started about a week ago like a sore throat and had severe indigestions patient viewed side effects of Jardiance at that time and did not see these as symptoms of reaction so continued the Jardiance until Saturday when his throat became sore it hurt to drink water and noticed some facial swelling patient then viewed allergic reaction to Jardiance and it stated facial swelling and throat swelling patient estopped Jardiance on Saturday none that day and none Sunday , patient says he he feels better today. Sore throat and swelling is gone advised patient to not take any more Jardiance and if swelling reoccurred to be evaluated immediately.   ?

## 2022-01-27 NOTE — Telephone Encounter (Signed)
Agree with holding jardiance.  It appears his symptoms have resolved.  Have him remain off.  Follow sugars.  Send in readings over the next few weeks.  If any problems or return of symptoms, needs to be evaluated.   ?

## 2022-01-28 NOTE — Telephone Encounter (Signed)
Patient given providers instructions. He voiced understanding of instructions. No questions or concerns.  ?

## 2022-01-28 NOTE — Telephone Encounter (Signed)
Lm for pt to cb.

## 2022-02-02 ENCOUNTER — Encounter: Payer: Self-pay | Admitting: Internal Medicine

## 2022-02-03 ENCOUNTER — Other Ambulatory Visit: Payer: Self-pay

## 2022-02-03 MED ORDER — CARVEDILOL 12.5 MG PO TABS: 12.5000 mg | ORAL_TABLET | Freq: Two times a day (BID) | ORAL | 0 refills | Status: DC

## 2022-04-11 ENCOUNTER — Other Ambulatory Visit (INDEPENDENT_AMBULATORY_CARE_PROVIDER_SITE_OTHER): Payer: 59

## 2022-04-11 DIAGNOSIS — E7800 Pure hypercholesterolemia, unspecified: Secondary | ICD-10-CM

## 2022-04-11 DIAGNOSIS — E1165 Type 2 diabetes mellitus with hyperglycemia: Secondary | ICD-10-CM | POA: Diagnosis not present

## 2022-04-11 LAB — HEPATIC FUNCTION PANEL
ALT: 16 U/L (ref 0–53)
AST: 15 U/L (ref 0–37)
Albumin: 4.2 g/dL (ref 3.5–5.2)
Alkaline Phosphatase: 74 U/L (ref 39–117)
Bilirubin, Direct: 0.1 mg/dL (ref 0.0–0.3)
Total Bilirubin: 0.7 mg/dL (ref 0.2–1.2)
Total Protein: 7.2 g/dL (ref 6.0–8.3)

## 2022-04-11 LAB — LIPID PANEL
Cholesterol: 140 mg/dL (ref 0–200)
HDL: 43.3 mg/dL (ref >39.00)
LDL Cholesterol: 78 mg/dL (ref 0–99)
NonHDL: 96.54
Total CHOL/HDL Ratio: 3
Triglycerides: 93 mg/dL (ref 0.0–149.0)
VLDL: 18.6 mg/dL (ref 0.0–40.0)

## 2022-04-11 LAB — MICROALBUMIN / CREATININE URINE RATIO
Creatinine,U: 151.3 mg/dL
Microalb Creat Ratio: 0.6 mg/g (ref 0.0–30.0)
Microalb, Ur: 1 mg/dL (ref 0.0–1.9)

## 2022-04-11 LAB — BASIC METABOLIC PANEL
BUN: 19 mg/dL (ref 6–23)
CO2: 26 mEq/L (ref 19–32)
Calcium: 9.1 mg/dL (ref 8.4–10.5)
Chloride: 101 mEq/L (ref 96–112)
Creatinine, Ser: 1.1 mg/dL (ref 0.40–1.50)
GFR: 74.82 mL/min (ref >60.00)
Glucose, Bld: 119 mg/dL — ABNORMAL HIGH (ref 70–99)
Potassium: 3.5 mEq/L (ref 3.5–5.1)
Sodium: 138 mEq/L (ref 135–145)

## 2022-04-11 LAB — HEMOGLOBIN A1C: Hgb A1c MFr Bld: 7.7 % — ABNORMAL HIGH (ref 4.6–6.5)

## 2022-04-16 ENCOUNTER — Ambulatory Visit (INDEPENDENT_AMBULATORY_CARE_PROVIDER_SITE_OTHER): Payer: 59 | Admitting: Internal Medicine

## 2022-04-16 ENCOUNTER — Encounter: Payer: Self-pay | Admitting: Internal Medicine

## 2022-04-16 DIAGNOSIS — I10 Essential (primary) hypertension: Secondary | ICD-10-CM | POA: Diagnosis not present

## 2022-04-16 DIAGNOSIS — D649 Anemia, unspecified: Secondary | ICD-10-CM

## 2022-04-16 DIAGNOSIS — E1165 Type 2 diabetes mellitus with hyperglycemia: Secondary | ICD-10-CM | POA: Diagnosis not present

## 2022-04-16 DIAGNOSIS — K219 Gastro-esophageal reflux disease without esophagitis: Secondary | ICD-10-CM | POA: Diagnosis not present

## 2022-04-16 DIAGNOSIS — E7800 Pure hypercholesterolemia, unspecified: Secondary | ICD-10-CM

## 2022-04-16 MED ORDER — GLIPIZIDE ER 2.5 MG PO TB24
2.5000 mg | ORAL_TABLET | Freq: Every day | ORAL | 2 refills | Status: DC
Start: 2022-04-16 — End: 2022-04-28

## 2022-04-16 MED ORDER — MELOXICAM 15 MG PO TABS: 15.0000 mg | ORAL_TABLET | Freq: Every day | ORAL | 0 refills | Status: DC | PRN

## 2022-04-16 NOTE — Progress Notes (Signed)
Patient ID: Aaron Headland., male   DOB: [birth year], 56 y.o.   MRN: [id number]   Subjective:    Patient ID: Aaron Headland., male    DOB: 1966/03/08, 56 y.o.   MRN: [id number]   Patient here for a scheduled follow up.   Chief Complaint  Patient presents with   Hypertension   Diabetes   .   HPI Reports he is doing relatively well.  Was started on jardiance last visit.  Developed swelling of his glands and sore throat.  This resolved after he stopped jardiance.  Recent labs reviewed.  A1c 7.7.  discussed diet and exercise.  He is drinking more soft drinks.  Stays active.  No chest pain or sob.  No acid reflux.  No abdominal pain.  Bowels moving.  Discussed treatment options.     Past Medical History:  Diagnosis Date   GERD (gastroesophageal reflux disease)    Hypercholesterolemia    Hyperglycemia    Hypertension    Iron deficiency anemia    Past Surgical History:  Procedure Laterality Date   INGUINAL HERNIA REPAIR     Family History  Problem Relation Age of Onset   Hypertension Mother    Hypertension Father    CVA Paternal Grandfather    Colon cancer Neg Hx    Prostate cancer Neg Hx    Social History   Socioeconomic History   Marital status: Married    Spouse name: Not on file   Number of children: 2   Years of education: Not on file   Highest education level: Not on file  Occupational History   Not on file  Tobacco Use   Smoking status: Never   Smokeless tobacco: Never  Vaping Use   Vaping Use: Never used  Substance and Sexual Activity   Alcohol use: No    Alcohol/week: 0.0 standard drinks of alcohol   Drug use: No   Sexual activity: Not on file  Other Topics Concern   Not on file  Social History Narrative   ** Merged History Encounter **       Social Determinants of Health   Financial Resource Strain: Low Risk  (11/19/2020)   Overall Financial Resource Strain (CARDIA)    Difficulty of Paying Living Expenses: Not hard at all  Food Insecurity: Not on  file  Transportation Needs: Not on file  Physical Activity: Insufficiently Active (08/08/2020)   Exercise Vital Sign    Days of Exercise per Week: 2 days    Minutes of Exercise per Session: 40 min  Stress: Not on file  Social Connections: Not on file     Review of Systems  Constitutional:  Negative for appetite change and unexpected weight change.  HENT:  Negative for congestion and sinus pressure.   Respiratory:  Negative for cough, chest tightness and shortness of breath.   Cardiovascular:  Negative for chest pain, palpitations and leg swelling.  Gastrointestinal:  Negative for abdominal pain, diarrhea, nausea and vomiting.  Genitourinary:  Negative for difficulty urinating and dysuria.  Musculoskeletal:  Negative for joint swelling and myalgias.  Skin:  Negative for color change and rash.  Neurological:  Negative for dizziness, light-headedness and headaches.  Psychiatric/Behavioral:  Negative for agitation and dysphoric mood.        Objective:     BP 128/78   Pulse 88   Temp 98.1 F (36.7 C) (Temporal)   Resp 16   Ht $R'5\' 10"'eN$  (1.778 m)   Wt 240 lb  9.6 oz (109.1 kg)   SpO2 98%   BMI 34.52 kg/m  Wt Readings from Last 3 Encounters:  04/16/22 240 lb 9.6 oz (109.1 kg)  01/13/22 230 lb (104.3 kg)  10/29/21 235 lb (106.6 kg)    Physical Exam Constitutional:      General: He is not in acute distress.    Appearance: Normal appearance. He is well-developed.  HENT:     Head: Normocephalic and atraumatic.     Right Ear: External ear normal.     Left Ear: External ear normal.  Eyes:     General: No scleral icterus.       Right eye: No discharge.        Left eye: No discharge.  Cardiovascular:     Rate and Rhythm: Normal rate and regular rhythm.  Pulmonary:     Effort: Pulmonary effort is normal. No respiratory distress.     Breath sounds: Normal breath sounds.  Abdominal:     General: Bowel sounds are normal.     Palpations: Abdomen is soft.     Tenderness: There  is no abdominal tenderness.  Musculoskeletal:        General: No swelling or tenderness.     Cervical back: Neck supple. No tenderness.  Lymphadenopathy:     Cervical: No cervical adenopathy.  Skin:    Findings: No erythema or rash.  Neurological:     Mental Status: He is alert.  Psychiatric:        Mood and Affect: Mood normal.        Behavior: Behavior normal.      Outpatient Encounter Medications as of 04/16/2022  Medication Sig   glipiZIDE (GLIPIZIDE XL) 2.5 MG 24 hr tablet Take 1 tablet (2.5 mg total) by mouth daily with breakfast.   meloxicam (MOBIC) 15 MG tablet Take 1 tablet (15 mg total) by mouth daily as needed for pain.   aspirin 81 MG tablet Take 81 mg by mouth daily.   atorvastatin (LIPITOR) 40 MG tablet Take 1 tablet (40 mg total) by mouth daily.   carvedilol (COREG) 12.5 MG tablet Take 1 tablet (12.5 mg total) by mouth 2 (two) times daily with a meal.   losartan-hydrochlorothiazide (HYZAAR) 100-12.5 MG tablet Take 1 tablet by mouth daily.   metFORMIN (GLUCOPHAGE XR) 500 MG 24 hr tablet Take 2 tablets (1,000 mg total) by mouth 2 (two) times daily.   mupirocin ointment (BACTROBAN) 2 % Apply 1 application. topically 2 (two) times daily.   omeprazole (PRILOSEC) 40 MG capsule Take 1 capsule (40 mg total) by mouth daily.   tamsulosin (FLOMAX) 0.4 MG CAPS capsule TAKE 1 CAPSULE DAILY AFTER SUPPER   [DISCONTINUED] empagliflozin (JARDIANCE) 10 MG TABS tablet Take 1 tablet (10 mg total) by mouth daily before breakfast. (Patient not taking: Reported on 04/16/2022)   No facility-administered encounter medications on file as of 04/16/2022.     Lab Results  Component Value Date   WBC 8.1 06/25/2021   HGB 13.9 06/25/2021   HCT 40.6 06/25/2021   PLT 300.0 06/25/2021   GLUCOSE 119 (H) 04/11/2022   CHOL 140 04/11/2022   TRIG 93.0 04/11/2022   HDL 43.30 04/11/2022   LDLCALC 78 04/11/2022   ALT 16 04/11/2022   AST 15 04/11/2022   NA 138 04/11/2022   K 3.5 04/11/2022   CL 101  04/11/2022   CREATININE 1.10 04/11/2022   BUN 19 04/11/2022   CO2 26 04/11/2022   TSH 3.16 06/25/2021   PSA 1.74  06/25/2021   HGBA1C 7.7 (H) 04/11/2022   MICROALBUR 1.0 04/11/2022       Assessment & Plan:   Problem List Items Addressed This Visit     Anemia    Follow cbc.       Diabetes mellitus ([place])    Low carb diet and exercise. Changed metformin to XR. Loose stools improved.  Discussed treatment options given elevated a1c.  Desires no injections.  Reaction to jardiance as outlined.  Start glipizide 2.59m q day. Follow sugars.  Discussed possible low sugar.  Follow met b and a1c.        Relevant Medications   glipiZIDE (GLIPIZIDE XL) 2.5 MG 24 hr tablet   GERD (gastroesophageal reflux disease)    No upper symptoms reported.  On omeprazole.        Hypercholesterolemia    On lipitor.  Low cholesterol diet and exercise.  Follow lipid panel and liver function tests.        Hypertension    On losartan/hctz and coreg.  Start jardiance.  Hold on additional blood pressure medication.  Follow pressures.  Follow metabolic panel         CEinar Pheasant MD

## 2022-04-20 NOTE — Assessment & Plan Note (Signed)
No upper symptoms reported.  On omeprazole.  

## 2022-04-20 NOTE — Assessment & Plan Note (Signed)
On losartan/hctz and coreg.  Start jardiance.  Hold on additional blood pressure medication.  Follow pressures.  Follow metabolic panel

## 2022-04-20 NOTE — Assessment & Plan Note (Signed)
Follow cbc.  

## 2022-04-20 NOTE — Assessment & Plan Note (Signed)
Low carb diet and exercise. Changed metformin to XR. Loose stools improved.  Discussed treatment options given elevated a1c.  Desires no injections.  Reaction to jardiance as outlined.  Start glipizide 2.71m q day. Follow sugars.  Discussed possible low sugar.  Follow met b and a1c.

## 2022-04-20 NOTE — Assessment & Plan Note (Signed)
On lipitor.  Low cholesterol diet and exercise.  Follow lipid panel and liver function tests.   

## 2022-04-28 ENCOUNTER — Other Ambulatory Visit: Payer: Self-pay

## 2022-04-28 ENCOUNTER — Telehealth: Payer: Self-pay | Admitting: Internal Medicine

## 2022-04-28 MED ORDER — GLIPIZIDE ER 2.5 MG PO TB24: 2.5000 mg | ORAL_TABLET | Freq: Every day | ORAL | 2 refills | Status: DC

## 2022-04-28 MED ORDER — CARVEDILOL 12.5 MG PO TABS: 12.5000 mg | ORAL_TABLET | Freq: Two times a day (BID) | ORAL | 2 refills | Status: DC

## 2022-04-28 NOTE — Telephone Encounter (Signed)
Pt spouse called stating pt need a refill on carvedilol and glipiZIDE sent to [facility]

## 2022-04-28 NOTE — Telephone Encounter (Signed)
sent 

## 2022-05-19 ENCOUNTER — Other Ambulatory Visit: Payer: Self-pay

## 2022-05-19 ENCOUNTER — Telehealth: Payer: Self-pay | Admitting: Internal Medicine

## 2022-05-19 MED ORDER — METFORMIN HCL ER 500 MG PO TB24: 1000.0000 mg | ORAL_TABLET | Freq: Two times a day (BID) | ORAL | 1 refills | Status: DC

## 2022-05-19 NOTE — Telephone Encounter (Signed)
Patient called and notified that metformin sent to [facility].

## 2022-05-19 NOTE — Telephone Encounter (Signed)
Pt spouse came into office stating pt need metformin sent to [facility]

## 2022-06-04 ENCOUNTER — Encounter: Payer: Self-pay | Admitting: Internal Medicine

## 2022-06-17 LAB — HM DIABETES FOOT EXAM

## 2022-06-18 ENCOUNTER — Encounter: Payer: Self-pay | Admitting: Internal Medicine

## 2022-06-18 ENCOUNTER — Other Ambulatory Visit: Payer: Self-pay

## 2022-06-18 ENCOUNTER — Ambulatory Visit (INDEPENDENT_AMBULATORY_CARE_PROVIDER_SITE_OTHER): Payer: 59 | Admitting: Internal Medicine

## 2022-06-18 VITALS — BP 138/84 | HR 70 | Temp 98.6°F | Ht 70.0 in | Wt 243.2 lb

## 2022-06-18 DIAGNOSIS — I10 Essential (primary) hypertension: Secondary | ICD-10-CM

## 2022-06-18 DIAGNOSIS — E1165 Type 2 diabetes mellitus with hyperglycemia: Secondary | ICD-10-CM

## 2022-06-18 DIAGNOSIS — E7800 Pure hypercholesterolemia, unspecified: Secondary | ICD-10-CM

## 2022-06-18 DIAGNOSIS — Z125 Encounter for screening for malignant neoplasm of prostate: Secondary | ICD-10-CM

## 2022-06-18 DIAGNOSIS — K219 Gastro-esophageal reflux disease without esophagitis: Secondary | ICD-10-CM | POA: Diagnosis not present

## 2022-06-18 NOTE — Progress Notes (Signed)
Patient ID: Aaron Headland., male   DOB: 1966/03/23, 56 y.o.   MRN: [id number]   Subjective:    Patient ID: Aaron Headland., male    DOB: 1966/02/14, 56 y.o.   MRN: [id number]   Patient here for  Chief Complaint  Patient presents with   Follow-up   .   HPI Here to follow up regarding hypercholesterolemia, hypertension and diabetes.  On metformin.  Did not tolerate jardiance (states caused sore throat and swelling of glands).  Last visit, started on glipizide.  Reviewed outside blood sugar readings.  AM sugars 90-120.  PM sugars - 110-120.  No chest pain.  No cough or congestion.  No acid reflux.  No abdominal pain.  Bowels moving.    Past Medical History:  Diagnosis Date   GERD (gastroesophageal reflux disease)    Hypercholesterolemia    Hyperglycemia    Hypertension    Iron deficiency anemia    Past Surgical History:  Procedure Laterality Date   INGUINAL HERNIA REPAIR     Family History  Problem Relation Age of Onset   Hypertension Mother    Hypertension Father    CVA Paternal Grandfather    Colon cancer Neg Hx    Prostate cancer Neg Hx    Social History   Socioeconomic History   Marital status: Married    Spouse name: Not on file   Number of children: 2   Years of education: Not on file   Highest education level: Not on file  Occupational History   Not on file  Tobacco Use   Smoking status: Never   Smokeless tobacco: Never  Vaping Use   Vaping Use: Never used  Substance and Sexual Activity   Alcohol use: No    Alcohol/week: 0.0 standard drinks of alcohol   Drug use: No   Sexual activity: Not on file  Other Topics Concern   Not on file  Social History Narrative   ** Merged History Encounter **       Social Determinants of Health   Financial Resource Strain: Low Risk  (11/19/2020)   Overall Financial Resource Strain (CARDIA)    Difficulty of Paying Living Expenses: Not hard at all  Food Insecurity: Not on file  Transportation Needs: Not on file   Physical Activity: Insufficiently Active (08/08/2020)   Exercise Vital Sign    Days of Exercise per Week: 2 days    Minutes of Exercise per Session: 40 min  Stress: Not on file  Social Connections: Not on file     Review of Systems  Constitutional:  Negative for appetite change and unexpected weight change.  HENT:  Negative for congestion and sinus pressure.   Respiratory:  Negative for cough, chest tightness and shortness of breath.   Cardiovascular:  Negative for chest pain, palpitations and leg swelling.  Gastrointestinal:  Negative for abdominal pain, diarrhea, nausea and vomiting.  Genitourinary:  Negative for difficulty urinating and dysuria.  Musculoskeletal:  Negative for joint swelling and myalgias.  Skin:  Negative for color change and rash.  Neurological:  Negative for dizziness and headaches.  Psychiatric/Behavioral:  Negative for agitation and dysphoric mood.        Increased stress.        Objective:     BP 138/84 (BP Location: Left Arm, Patient Position: Sitting, Cuff Size: Normal)   Pulse 70   Temp 98.6 F (37 C) (Oral)   Ht 5' 10"  (1.778 m)   Wt 243 lb 3.2  oz (110.3 kg)   SpO2 98%   BMI 34.90 kg/m  Wt Readings from Last 3 Encounters:  06/18/22 243 lb 3.2 oz (110.3 kg)  04/16/22 240 lb 9.6 oz (109.1 kg)  01/13/22 230 lb (104.3 kg)    Physical Exam Constitutional:      General: He is not in acute distress.    Appearance: Normal appearance. He is well-developed.  HENT:     Head: Normocephalic and atraumatic.     Right Ear: External ear normal.     Left Ear: External ear normal.  Eyes:     General: No scleral icterus.       Right eye: No discharge.        Left eye: No discharge.  Cardiovascular:     Rate and Rhythm: Normal rate and regular rhythm.  Pulmonary:     Effort: Pulmonary effort is normal. No respiratory distress.     Breath sounds: Normal breath sounds.  Abdominal:     General: Bowel sounds are normal.     Palpations: Abdomen is  soft.     Tenderness: There is no abdominal tenderness.  Musculoskeletal:        General: No swelling or tenderness.     Cervical back: Neck supple. No tenderness.  Lymphadenopathy:     Cervical: No cervical adenopathy.  Skin:    Findings: No erythema or rash.  Neurological:     Mental Status: He is alert.  Psychiatric:        Mood and Affect: Mood normal.        Behavior: Behavior normal.      Outpatient Encounter Medications as of 06/18/2022  Medication Sig   aspirin 81 MG tablet Take 81 mg by mouth daily.   atorvastatin (LIPITOR) 40 MG tablet Take 1 tablet (40 mg total) by mouth daily.   carvedilol (COREG) 12.5 MG tablet Take 1 tablet (12.5 mg total) by mouth 2 (two) times daily with a meal.   glipiZIDE (GLIPIZIDE XL) 2.5 MG 24 hr tablet Take 1 tablet (2.5 mg total) by mouth daily with breakfast.   losartan-hydrochlorothiazide (HYZAAR) 100-12.5 MG tablet Take 1 tablet by mouth daily.   meloxicam (MOBIC) 15 MG tablet Take 1 tablet (15 mg total) by mouth daily as needed for pain.   metFORMIN (GLUCOPHAGE XR) 500 MG 24 hr tablet Take 2 tablets (1,000 mg total) by mouth 2 (two) times daily.   mupirocin ointment (BACTROBAN) 2 % Apply 1 application. topically 2 (two) times daily.   omeprazole (PRILOSEC) 40 MG capsule Take 1 capsule (40 mg total) by mouth daily.   tamsulosin (FLOMAX) 0.4 MG CAPS capsule TAKE 1 CAPSULE DAILY AFTER SUPPER   No facility-administered encounter medications on file as of 06/18/2022.     Lab Results  Component Value Date   WBC 8.1 06/25/2021   HGB 13.9 06/25/2021   HCT 40.6 06/25/2021   PLT 300.0 06/25/2021   GLUCOSE 119 (H) 04/11/2022   CHOL 140 04/11/2022   TRIG 93.0 04/11/2022   HDL 43.30 04/11/2022   LDLCALC 78 04/11/2022   ALT 16 04/11/2022   AST 15 04/11/2022   NA 138 04/11/2022   K 3.5 04/11/2022   CL 101 04/11/2022   CREATININE 1.10 04/11/2022   BUN 19 04/11/2022   CO2 26 04/11/2022   TSH 3.16 06/25/2021   PSA 1.74 06/25/2021   HGBA1C  7.7 (H) 04/11/2022   MICROALBUR 1.0 04/11/2022       Assessment & Plan:   Problem List  Items Addressed This Visit     Diabetes mellitus ([place])    Low carb diet and exercise. Changed metformin to XR. Loose stools improved.  Discussed treatment options given elevated a1c.  Desires no injections.  Reaction to jardiance as outlined.  Was started on glipizide 2.81m q day. Reviewed outside sugar readings. Improved. Follow met b and a1c.        Relevant Orders   Hemoglobin A1c   GERD (gastroesophageal reflux disease)    No upper symptoms reported.  On omeprazole.        Hypercholesterolemia    On lipitor.  Low cholesterol diet and exercise.  Follow lipid panel and liver function tests.        Relevant Orders   CBC with Differential/Platelet   Hepatic function panel   TSH   Lipid panel   Hypertension    On losartan/hctz and coreg. Outside blood pressures reviewed.  Follow pressures.  Follow metabolic panel.      Relevant Orders   Basic metabolic panel   Other Visit Diagnoses     Prostate cancer screening    -  Primary   Relevant Orders   PSA        CEinar Pheasant MD

## 2022-06-22 ENCOUNTER — Encounter: Payer: Self-pay | Admitting: Internal Medicine

## 2022-06-22 NOTE — Assessment & Plan Note (Signed)
On losartan/hctz and coreg. Outside blood pressures reviewed.  Follow pressures.  Follow metabolic panel.

## 2022-06-22 NOTE — Assessment & Plan Note (Signed)
No upper symptoms reported.  On omeprazole.  

## 2022-06-22 NOTE — Assessment & Plan Note (Signed)
On lipitor.  Low cholesterol diet and exercise.  Follow lipid panel and liver function tests.   

## 2022-06-22 NOTE — Assessment & Plan Note (Signed)
Low carb diet and exercise. Changed metformin to XR. Loose stools improved.  Discussed treatment options given elevated a1c.  Desires no injections.  Reaction to jardiance as outlined.  Was started on glipizide 2.5mg q day. Reviewed outside sugar readings. Improved. Follow met b and a1c.   

## 2022-06-25 ENCOUNTER — Telehealth: Payer: Self-pay | Admitting: Internal Medicine

## 2022-06-25 ENCOUNTER — Other Ambulatory Visit: Payer: Self-pay

## 2022-06-25 MED ORDER — MUPIROCIN 2 % EX OINT: 1.0000 | TOPICAL_OINTMENT | Freq: Two times a day (BID) | CUTANEOUS | 0 refills | Status: AC

## 2022-06-25 NOTE — Telephone Encounter (Signed)
Pt notified refill sent

## 2022-06-25 NOTE — Telephone Encounter (Signed)
Patient is requesting a refill on his mupirocin ointment (BACTROBAN) 2 %.

## 2022-07-22 ENCOUNTER — Telehealth: Payer: Self-pay | Admitting: Internal Medicine

## 2022-07-22 NOTE — Telephone Encounter (Signed)
Pt need a refill on glipiZIDE and carvedilol sent to [facility]

## 2022-07-23 MED ORDER — GLIPIZIDE ER 2.5 MG PO TB24: 2.5000 mg | ORAL_TABLET | Freq: Every day | ORAL | 2 refills | Status: DC

## 2022-07-23 MED ORDER — CARVEDILOL 12.5 MG PO TABS: 12.5000 mg | ORAL_TABLET | Freq: Two times a day (BID) | ORAL | 2 refills | Status: DC

## 2022-07-23 NOTE — Telephone Encounter (Signed)
Rx for carvedilol and glipizide [facility].

## 2022-07-28 ENCOUNTER — Telehealth: Payer: Self-pay

## 2022-07-28 NOTE — Telephone Encounter (Signed)
Patient's wife, Amun Stemm, called to state patient needs refills for the following medications:  atorvastatin (LIPITOR) 40 MG tablet,  losartan-hydrochlorothiazide (HYZAAR) 100-12.5 MG tablet, metFORMIN (GLUCOPHAGE XR) 500 MG 24 hr tablet, omeprazole (PRILOSEC) 40 MG capsule, tamsulosin (FLOMAX) 0.4 MG CAPS capsule.  Milus Banister states patient is getting low on these medications.  Milus Banister requests that we please call these prescriptions in to [facility].  Milus Banister states these are 90-day supplies for the medications.

## 2022-07-29 ENCOUNTER — Other Ambulatory Visit: Payer: Self-pay

## 2022-07-29 MED ORDER — TAMSULOSIN HCL 0.4 MG PO CAPS: ORAL_CAPSULE | ORAL | 3 refills | Status: DC

## 2022-07-29 MED ORDER — GLIPIZIDE ER 2.5 MG PO TB24: 2.5000 mg | ORAL_TABLET | Freq: Every day | ORAL | 2 refills | Status: DC

## 2022-07-29 MED ORDER — METFORMIN HCL ER 500 MG PO TB24: 1000.0000 mg | ORAL_TABLET | Freq: Two times a day (BID) | ORAL | 1 refills | Status: DC

## 2022-07-29 MED ORDER — LOSARTAN POTASSIUM-HCTZ 100-12.5 MG PO TABS: 1.0000 | ORAL_TABLET | Freq: Every day | ORAL | 3 refills | Status: DC

## 2022-07-29 MED ORDER — OMEPRAZOLE 40 MG PO CPDR: 40.0000 mg | DELAYED_RELEASE_CAPSULE | Freq: Every day | ORAL | 3 refills | Status: DC

## 2022-07-29 MED ORDER — ATORVASTATIN CALCIUM 40 MG PO TABS: 40.0000 mg | ORAL_TABLET | Freq: Every day | ORAL | 3 refills | Status: DC

## 2022-07-29 NOTE — Telephone Encounter (Signed)
sent 

## 2022-08-04 LAB — HM DIABETES EYE EXAM

## 2022-09-05 ENCOUNTER — Other Ambulatory Visit (INDEPENDENT_AMBULATORY_CARE_PROVIDER_SITE_OTHER): Payer: 59

## 2022-09-05 DIAGNOSIS — E7800 Pure hypercholesterolemia, unspecified: Secondary | ICD-10-CM | POA: Diagnosis not present

## 2022-09-05 DIAGNOSIS — E1165 Type 2 diabetes mellitus with hyperglycemia: Secondary | ICD-10-CM

## 2022-09-05 DIAGNOSIS — I10 Essential (primary) hypertension: Secondary | ICD-10-CM | POA: Diagnosis not present

## 2022-09-05 DIAGNOSIS — Z125 Encounter for screening for malignant neoplasm of prostate: Secondary | ICD-10-CM | POA: Diagnosis not present

## 2022-09-05 LAB — CBC WITH DIFFERENTIAL/PLATELET
Basophils Absolute: 0.1 10*3/uL (ref 0.0–0.1)
Basophils Relative: 0.5 % (ref 0.0–3.0)
Eosinophils Absolute: 0.2 10*3/uL (ref 0.0–0.7)
Eosinophils Relative: 2 % (ref 0.0–5.0)
HCT: 41.2 % (ref 39.0–52.0)
Hemoglobin: 14 g/dL (ref 13.0–17.0)
Lymphocytes Relative: 28 % (ref 12.0–46.0)
Lymphs Abs: 2.8 10*3/uL (ref 0.7–4.0)
MCHC: 34.1 g/dL (ref 30.0–36.0)
MCV: 88.2 fl (ref 78.0–100.0)
Monocytes Absolute: 0.8 10*3/uL (ref 0.1–1.0)
Monocytes Relative: 7.6 % (ref 3.0–12.0)
Neutro Abs: 6.1 10*3/uL (ref 1.4–7.7)
Neutrophils Relative %: 61.9 % (ref 43.0–77.0)
Platelets: 296 10*3/uL (ref 150.0–400.0)
RBC: 4.67 Mil/uL (ref 4.22–5.81)
RDW: 13.3 % (ref 11.5–15.5)
WBC: 9.9 10*3/uL (ref 4.0–10.5)

## 2022-09-05 LAB — BASIC METABOLIC PANEL
BUN: 16 mg/dL (ref 6–23)
CO2: 30 mEq/L (ref 19–32)
Calcium: 9.2 mg/dL (ref 8.4–10.5)
Chloride: 100 mEq/L (ref 96–112)
Creatinine, Ser: 1.11 mg/dL (ref 0.40–1.50)
GFR: 73.8 mL/min (ref >60.00)
Glucose, Bld: 93 mg/dL (ref 70–99)
Potassium: 3.7 mEq/L (ref 3.5–5.1)
Sodium: 139 mEq/L (ref 135–145)

## 2022-09-05 LAB — HEPATIC FUNCTION PANEL
ALT: 15 U/L (ref 0–53)
AST: 13 U/L (ref 0–37)
Albumin: 4.1 g/dL (ref 3.5–5.2)
Alkaline Phosphatase: 70 U/L (ref 39–117)
Bilirubin, Direct: 0.1 mg/dL (ref 0.0–0.3)
Total Bilirubin: 0.7 mg/dL (ref 0.2–1.2)
Total Protein: 6.9 g/dL (ref 6.0–8.3)

## 2022-09-05 LAB — PSA: PSA: 1.98 ng/mL (ref 0.10–4.00)

## 2022-09-05 LAB — HEMOGLOBIN A1C: Hgb A1c MFr Bld: 7.3 % — ABNORMAL HIGH (ref 4.6–6.5)

## 2022-09-05 LAB — LIPID PANEL
Cholesterol: 132 mg/dL (ref 0–200)
HDL: 40.9 mg/dL (ref >39.00)
LDL Cholesterol: 71 mg/dL (ref 0–99)
NonHDL: 90.88
Total CHOL/HDL Ratio: 3
Triglycerides: 98 mg/dL (ref 0.0–149.0)
VLDL: 19.6 mg/dL (ref 0.0–40.0)

## 2022-09-05 LAB — TSH: TSH: 1.7 u[IU]/mL (ref 0.35–5.50)

## 2022-09-10 ENCOUNTER — Ambulatory Visit (INDEPENDENT_AMBULATORY_CARE_PROVIDER_SITE_OTHER): Payer: 59 | Admitting: Internal Medicine

## 2022-09-10 ENCOUNTER — Encounter: Payer: Self-pay | Admitting: Internal Medicine

## 2022-09-10 VITALS — BP 152/90 | HR 82 | Temp 98.1°F | Resp 17 | Ht 70.0 in | Wt 250.0 lb

## 2022-09-10 DIAGNOSIS — E7800 Pure hypercholesterolemia, unspecified: Secondary | ICD-10-CM | POA: Diagnosis not present

## 2022-09-10 DIAGNOSIS — Z0000 Encounter for general adult medical examination without abnormal findings: Secondary | ICD-10-CM | POA: Diagnosis not present

## 2022-09-10 DIAGNOSIS — E1165 Type 2 diabetes mellitus with hyperglycemia: Secondary | ICD-10-CM | POA: Diagnosis not present

## 2022-09-10 DIAGNOSIS — D649 Anemia, unspecified: Secondary | ICD-10-CM

## 2022-09-10 DIAGNOSIS — K219 Gastro-esophageal reflux disease without esophagitis: Secondary | ICD-10-CM

## 2022-09-10 DIAGNOSIS — I10 Essential (primary) hypertension: Secondary | ICD-10-CM

## 2022-09-10 MED ORDER — METFORMIN HCL ER 500 MG PO TB24: 1000.0000 mg | ORAL_TABLET | Freq: Two times a day (BID) | ORAL | 1 refills | Status: DC

## 2022-09-10 MED ORDER — OMEPRAZOLE 40 MG PO CPDR: 40.0000 mg | DELAYED_RELEASE_CAPSULE | Freq: Every day | ORAL | 3 refills | Status: DC

## 2022-09-10 MED ORDER — AMLODIPINE BESYLATE 5 MG PO TABS: 5.0000 mg | ORAL_TABLET | Freq: Every day | ORAL | 2 refills | Status: DC

## 2022-09-10 MED ORDER — ATORVASTATIN CALCIUM 40 MG PO TABS: 40.0000 mg | ORAL_TABLET | Freq: Every day | ORAL | 3 refills | Status: DC

## 2022-09-10 MED ORDER — CARVEDILOL 12.5 MG PO TABS: 12.5000 mg | ORAL_TABLET | Freq: Two times a day (BID) | ORAL | 2 refills | Status: DC

## 2022-09-10 MED ORDER — LOSARTAN POTASSIUM-HCTZ 100-12.5 MG PO TABS: 1.0000 | ORAL_TABLET | Freq: Every day | ORAL | 3 refills | Status: DC

## 2022-09-10 MED ORDER — TAMSULOSIN HCL 0.4 MG PO CAPS: ORAL_CAPSULE | ORAL | 3 refills | Status: DC

## 2022-09-10 MED ORDER — GLIPIZIDE ER 2.5 MG PO TB24: 2.5000 mg | ORAL_TABLET | Freq: Every day | ORAL | 2 refills | Status: DC

## 2022-09-10 NOTE — Progress Notes (Signed)
Patient ID: Aaron Headland., Aaron Allison   DOB: 1966-06-24, 56 y.o.   MRN: [id number]   Subjective:    Patient ID: Aaron Headland., Aaron Allison    DOB: [birth year], 56 y.o.   MRN: [id number]   Patient here for  Chief Complaint  Patient presents with   Annual Exam    CPE    .   HPI Here for a physical exam.  On metformin - diabetes. Recently started on glipizide.  Did not tolerate jardiance.  Recent A1c improved - 7.3.  reviewed outside sugar readings.  Blood sugars in the mornings ranging 96-110.  Had a couple checks in the evening around the low 100s.  Has been under a lot of stress recently.  Stress with family medical issues.  Father had a below the knee amputation.  He has been working on his house.  Overall he feels he is handling things well.  Has not been able to exercise.  Has not been watching his diet as well.  Discussed labs.  No chest pain or shortness of breath.  No nausea or vomiting.  No abdominal pain or bowel change.   Past Medical History:  Diagnosis Date   GERD (gastroesophageal reflux disease)    Hypercholesterolemia    Hyperglycemia    Hypertension    Iron deficiency anemia    Past Surgical History:  Procedure Laterality Date   INGUINAL HERNIA REPAIR     Family History  Problem Relation Age of Onset   Hypertension Mother    Hypertension Father    CVA Paternal Grandfather    Colon cancer Neg Hx    Prostate cancer Neg Hx    Social History   Socioeconomic History   Marital status: Married    Spouse name: Not on file   Number of children: 2   Years of education: Not on file   Highest education level: Not on file  Occupational History   Not on file  Tobacco Use   Smoking status: Never   Smokeless tobacco: Never  Vaping Use   Vaping Use: Never used  Substance and Sexual Activity   Alcohol use: No    Alcohol/week: 0.0 standard drinks of alcohol   Drug use: No   Sexual activity: Not on file  Other Topics Concern   Not on file  Social History Narrative   **  Merged History Encounter **       Social Determinants of Health   Financial Resource Strain: Low Risk  (11/19/2020)   Overall Financial Resource Strain (CARDIA)    Difficulty of Paying Living Expenses: Not hard at all  Food Insecurity: Not on file  Transportation Needs: Not on file  Physical Activity: Insufficiently Active (08/08/2020)   Exercise Vital Sign    Days of Exercise per Week: 2 days    Minutes of Exercise per Session: 40 min  Stress: Not on file  Social Connections: Not on file     Review of Systems  Constitutional:  Negative for fatigue and unexpected weight change.  HENT:  Negative for congestion, sinus pressure and sore throat.   Eyes:  Negative for pain and visual disturbance.  Respiratory:  Negative for cough, chest tightness and shortness of breath.   Cardiovascular:  Negative for chest pain and palpitations.  Gastrointestinal:  Negative for abdominal pain, diarrhea, nausea and vomiting.  Genitourinary:  Negative for difficulty urinating and dysuria.  Musculoskeletal:  Negative for joint swelling and myalgias.  Skin:  Negative for color change and  rash.  Neurological:  Negative for dizziness, light-headedness and headaches.  Hematological:  Negative for adenopathy. Does not bruise/bleed easily.  Psychiatric/Behavioral:  Negative for agitation and dysphoric mood.        Objective:     BP (!) 152/90 (BP Location: Left Arm, Patient Position: Sitting, Cuff Size: Large)   Pulse 82   Temp 98.1 F (36.7 C) (Temporal)   Resp 17   Ht _0  (1.778 m)   Wt 250 lb (113.4 kg)   SpO2 97%   BMI 35.87 kg/m  Wt Readings from Last 3 Encounters:  09/10/22 250 lb (113.4 kg)  06/18/22 243 lb 3.2 oz (110.3 kg)  04/16/22 240 lb 9.6 oz (109.1 kg)    Physical Exam Constitutional:      General: He is not in acute distress.    Appearance: Normal appearance. He is well-developed.  HENT:     Head: Normocephalic and atraumatic.     Right Ear: External ear normal.      Left Ear: External ear normal.  Eyes:     General: No scleral icterus.       Right eye: No discharge.        Left eye: No discharge.     Conjunctiva/sclera: Conjunctivae normal.  Neck:     Thyroid: No thyromegaly.  Cardiovascular:     Rate and Rhythm: Normal rate and regular rhythm.  Pulmonary:     Effort: No respiratory distress.     Breath sounds: Normal breath sounds. No wheezing.  Abdominal:     General: Bowel sounds are normal.     Palpations: Abdomen is soft.     Tenderness: There is no abdominal tenderness.  Musculoskeletal:        General: No swelling or tenderness.     Cervical back: Neck supple. No tenderness.  Lymphadenopathy:     Cervical: No cervical adenopathy.  Skin:    Findings: No erythema or rash.  Neurological:     Mental Status: He is alert and oriented to person, place, and time.  Psychiatric:        Mood and Affect: Mood normal.        Behavior: Behavior normal.      Outpatient Encounter Medications as of 09/10/2022  Medication Sig   amLODipine (NORVASC) 5 MG tablet Take 1 tablet (5 mg total) by mouth daily.   aspirin 81 MG tablet Take 81 mg by mouth daily.   meloxicam (MOBIC) 15 MG tablet Take 1 tablet (15 mg total) by mouth daily as needed for pain.   mupirocin ointment (BACTROBAN) 2 % Apply 1 Application topically 2 (two) times daily.   [DISCONTINUED] atorvastatin (LIPITOR) 40 MG tablet Take 1 tablet (40 mg total) by mouth daily.   [DISCONTINUED] carvedilol (COREG) 12.5 MG tablet Take 1 tablet (12.5 mg total) by mouth 2 (two) times daily with a meal.   [DISCONTINUED] glipiZIDE (GLIPIZIDE XL) 2.5 MG 24 hr tablet Take 1 tablet (2.5 mg total) by mouth daily with breakfast.   [DISCONTINUED] losartan-hydrochlorothiazide (HYZAAR) 100-12.5 MG tablet Take 1 tablet by mouth daily.   [DISCONTINUED] metFORMIN (GLUCOPHAGE XR) 500 MG 24 hr tablet Take 2 tablets (1,000 mg total) by mouth 2 (two) times daily.   [DISCONTINUED] omeprazole (PRILOSEC) 40 MG capsule  Take 1 capsule (40 mg total) by mouth daily.   [DISCONTINUED] tamsulosin (FLOMAX) 0.4 MG CAPS capsule TAKE 1 CAPSULE DAILY AFTER SUPPER   atorvastatin (LIPITOR) 40 MG tablet Take 1 tablet (40 mg total) by mouth daily.  carvedilol (COREG) 12.5 MG tablet Take 1 tablet (12.5 mg total) by mouth 2 (two) times daily with a meal.   glipiZIDE (GLIPIZIDE XL) 2.5 MG 24 hr tablet Take 1 tablet (2.5 mg total) by mouth daily with breakfast.   losartan-hydrochlorothiazide (HYZAAR) 100-12.5 MG tablet Take 1 tablet by mouth daily.   metFORMIN (GLUCOPHAGE XR) 500 MG 24 hr tablet Take 2 tablets (1,000 mg total) by mouth 2 (two) times daily.   omeprazole (PRILOSEC) 40 MG capsule Take 1 capsule (40 mg total) by mouth daily.   tamsulosin (FLOMAX) 0.4 MG CAPS capsule TAKE 1 CAPSULE DAILY AFTER SUPPER   No facility-administered encounter medications on file as of 09/10/2022.     Lab Results  Component Value Date   WBC 9.9 09/05/2022   HGB 14.0 09/05/2022   HCT 41.2 09/05/2022   PLT 296.0 09/05/2022   GLUCOSE 93 09/05/2022   CHOL 132 09/05/2022   TRIG 98.0 09/05/2022   HDL 40.90 09/05/2022   LDLCALC 71 09/05/2022   ALT 15 09/05/2022   AST 13 09/05/2022   NA 139 09/05/2022   K 3.7 09/05/2022   CL 100 09/05/2022   CREATININE 1.11 09/05/2022   BUN 16 09/05/2022   CO2 30 09/05/2022   TSH 1.70 09/05/2022   PSA 1.98 09/05/2022   HGBA1C 7.3 (H) 09/05/2022   MICROALBUR 1.0 04/11/2022       Assessment & Plan:   Problem List Items Addressed This Visit     Anemia    Follow cbc.       Diabetes mellitus ([place])    Low carb diet and exercise. Changed metformin to XR. Loose stools improved.  Desires no injections.  Reaction to jardiance. Was started on glipizide 2.50m q day. Reviewed outside sugar readings. Improved. Follow met b and a1c.  Hold on making changes.  A1c improved - 7.3.       Relevant Medications   atorvastatin (LIPITOR) 40 MG tablet   glipiZIDE (GLIPIZIDE XL) 2.5 MG 24 hr tablet    losartan-hydrochlorothiazide (HYZAAR) 100-12.5 MG tablet   metFORMIN (GLUCOPHAGE XR) 500 MG 24 hr tablet   Other Relevant Orders   HgB A1c   GERD (gastroesophageal reflux disease)    No upper symptoms reported.  On omeprazole.        Relevant Medications   omeprazole (PRILOSEC) 40 MG capsule   Health care maintenance    Physical today 09/10/22.   Followed by urology for psa.  Colonoscopy 05/2017.        Hypercholesterolemia    On lipitor.  Low cholesterol diet and exercise.  Follow lipid panel and liver function tests.        Relevant Medications   atorvastatin (LIPITOR) 40 MG tablet   carvedilol (COREG) 12.5 MG tablet   losartan-hydrochlorothiazide (HYZAAR) 100-12.5 MG tablet   amLODipine (NORVASC) 5 MG tablet   Other Relevant Orders   Hepatic function panel   Lipid Profile   Hypertension    On losartan/hctz and coreg.  Pressures remaining elevated.  Amlodipine 549mq day.  Follow pressures.  Follow metabolic panel.      Relevant Medications   atorvastatin (LIPITOR) 40 MG tablet   carvedilol (COREG) 12.5 MG tablet   losartan-hydrochlorothiazide (HYZAAR) 100-12.5 MG tablet   amLODipine (NORVASC) 5 MG tablet   Other Relevant Orders   Basic Metabolic Panel (BMET)   Other Visit Diagnoses     Routine general medical examination at a health care facility    -  Primary  Cregg Jutte, MD  

## 2022-09-10 NOTE — Assessment & Plan Note (Signed)
Physical today 09/10/22.   Followed by urology for psa.  Colonoscopy 05/2017.

## 2022-09-24 ENCOUNTER — Encounter: Payer: Self-pay | Admitting: Internal Medicine

## 2022-09-24 NOTE — Assessment & Plan Note (Signed)
Follow cbc.  

## 2022-09-24 NOTE — Assessment & Plan Note (Signed)
On lipitor.  Low cholesterol diet and exercise.  Follow lipid panel and liver function tests.   

## 2022-09-24 NOTE — Assessment & Plan Note (Signed)
Low carb diet and exercise. Changed metformin to XR. Loose stools improved.  Desires no injections.  Reaction to jardiance. Was started on glipizide 2.23m q day. Reviewed outside sugar readings. Improved. Follow met b and a1c.  Hold on making changes.  A1c improved - 7.3.

## 2022-09-24 NOTE — Assessment & Plan Note (Signed)
On losartan/hctz and coreg.  Pressures remaining elevated.  Amlodipine 5mg  q day.  Follow pressures.  Follow metabolic panel.

## 2022-09-24 NOTE — Assessment & Plan Note (Signed)
No upper symptoms reported.  On omeprazole.  

## 2022-10-16 DIAGNOSIS — Z872 Personal history of diseases of the skin and subcutaneous tissue: Secondary | ICD-10-CM | POA: Diagnosis not present

## 2022-10-16 DIAGNOSIS — L578 Other skin changes due to chronic exposure to nonionizing radiation: Secondary | ICD-10-CM | POA: Diagnosis not present

## 2022-10-16 DIAGNOSIS — L281 Prurigo nodularis: Secondary | ICD-10-CM | POA: Diagnosis not present

## 2022-10-16 DIAGNOSIS — D485 Neoplasm of uncertain behavior of skin: Secondary | ICD-10-CM | POA: Diagnosis not present

## 2022-10-16 DIAGNOSIS — L570 Actinic keratosis: Secondary | ICD-10-CM | POA: Diagnosis not present

## 2022-10-29 ENCOUNTER — Encounter: Payer: Self-pay | Admitting: Internal Medicine

## 2022-10-30 ENCOUNTER — Other Ambulatory Visit: Payer: Self-pay

## 2022-10-30 MED ORDER — AMLODIPINE BESYLATE 5 MG PO TABS: 5.0000 mg | ORAL_TABLET | Freq: Every day | ORAL | 2 refills | Status: DC

## 2022-12-10 ENCOUNTER — Other Ambulatory Visit (INDEPENDENT_AMBULATORY_CARE_PROVIDER_SITE_OTHER): Payer: 59

## 2022-12-10 DIAGNOSIS — E7800 Pure hypercholesterolemia, unspecified: Secondary | ICD-10-CM

## 2022-12-10 DIAGNOSIS — I10 Essential (primary) hypertension: Secondary | ICD-10-CM | POA: Diagnosis not present

## 2022-12-10 DIAGNOSIS — E1165 Type 2 diabetes mellitus with hyperglycemia: Secondary | ICD-10-CM

## 2022-12-10 LAB — BASIC METABOLIC PANEL
BUN: 14 mg/dL (ref 6–23)
CO2: 28 mEq/L (ref 19–32)
Calcium: 9.1 mg/dL (ref 8.4–10.5)
Chloride: 100 mEq/L (ref 96–112)
Creatinine, Ser: 1 mg/dL (ref 0.40–1.50)
GFR: 83.49 mL/min (ref >60.00)
Glucose, Bld: 101 mg/dL — ABNORMAL HIGH (ref 70–99)
Potassium: 3.7 mEq/L (ref 3.5–5.1)
Sodium: 138 mEq/L (ref 135–145)

## 2022-12-10 LAB — LIPID PANEL
Cholesterol: 152 mg/dL (ref 0–200)
HDL: 43.6 mg/dL (ref >39.00)
LDL Cholesterol: 88 mg/dL (ref 0–99)
NonHDL: 108.24
Total CHOL/HDL Ratio: 3
Triglycerides: 100 mg/dL (ref 0.0–149.0)
VLDL: 20 mg/dL (ref 0.0–40.0)

## 2022-12-10 LAB — HEPATIC FUNCTION PANEL
ALT: 18 U/L (ref 0–53)
AST: 15 U/L (ref 0–37)
Albumin: 3.8 g/dL (ref 3.5–5.2)
Alkaline Phosphatase: 72 U/L (ref 39–117)
Bilirubin, Direct: 0.2 mg/dL (ref 0.0–0.3)
Total Bilirubin: 0.8 mg/dL (ref 0.2–1.2)
Total Protein: 6.6 g/dL (ref 6.0–8.3)

## 2022-12-10 LAB — HEMOGLOBIN A1C: Hgb A1c MFr Bld: 7.2 % — ABNORMAL HIGH (ref 4.6–6.5)

## 2022-12-11 ENCOUNTER — Telehealth: Payer: Self-pay

## 2022-12-11 NOTE — Telephone Encounter (Signed)
-----   Message from Einar Pheasant, MD sent at 12/10/2022  9:58 PM EDT ----- Notify - overall sugar control improved some from last check.  A1c 7.2.  she if he is checking and recording sugars.  If not, check and record bid and bring in to his upcoming appt.  Kidney function and liver function tests are wnl.  Cholesterol ok.

## 2022-12-11 NOTE — Telephone Encounter (Signed)
LMTCB

## 2022-12-12 ENCOUNTER — Ambulatory Visit: Payer: 59 | Admitting: Internal Medicine

## 2022-12-16 ENCOUNTER — Encounter: Payer: Self-pay | Admitting: Internal Medicine

## 2022-12-23 ENCOUNTER — Ambulatory Visit: Payer: 59 | Admitting: Internal Medicine

## 2022-12-23 NOTE — Telephone Encounter (Signed)
Pt returned [place] LPN call. Transferred.

## 2022-12-23 NOTE — Telephone Encounter (Signed)
Results given to patient. Has appt with Dr Nicki Reaper tomorrow.

## 2022-12-24 ENCOUNTER — Ambulatory Visit (INDEPENDENT_AMBULATORY_CARE_PROVIDER_SITE_OTHER): Payer: 59 | Admitting: Internal Medicine

## 2022-12-24 ENCOUNTER — Encounter: Payer: Self-pay | Admitting: Internal Medicine

## 2022-12-24 VITALS — BP 126/70 | HR 76 | Temp 98.3°F | Resp 16 | Ht 70.0 in | Wt 248.0 lb

## 2022-12-24 DIAGNOSIS — E7800 Pure hypercholesterolemia, unspecified: Secondary | ICD-10-CM | POA: Diagnosis not present

## 2022-12-24 DIAGNOSIS — I10 Essential (primary) hypertension: Secondary | ICD-10-CM

## 2022-12-24 DIAGNOSIS — D649 Anemia, unspecified: Secondary | ICD-10-CM | POA: Diagnosis not present

## 2022-12-24 DIAGNOSIS — E1165 Type 2 diabetes mellitus with hyperglycemia: Secondary | ICD-10-CM | POA: Diagnosis not present

## 2022-12-24 DIAGNOSIS — K219 Gastro-esophageal reflux disease without esophagitis: Secondary | ICD-10-CM | POA: Diagnosis not present

## 2022-12-24 LAB — HM DIABETES EYE EXAM

## 2022-12-24 MED ORDER — AMLODIPINE BESYLATE 10 MG PO TABS: 10.0000 mg | ORAL_TABLET | Freq: Every day | ORAL | 1 refills | Status: DC

## 2022-12-24 NOTE — Progress Notes (Signed)
Subjective:    Patient ID: Aaron Headland., male    DOB: [birth year], 57 y.o.   MRN: [id number]  Patient here for  Chief Complaint  Patient presents with   Medical Management of Chronic Issues    HPI Here for follow up regarding diabetes, hypertension and hypercholesterolemia.  Amlodipine added 09/10/22. Reviewed recent labs.  Recent A1c 7.2.  decreasing.  Reviewed outside blood sugars.  Bedtime sugars 148-150.  Fasting 90-110.  Discussed diet and exercise.  Discussed medication.  Given A1c improving and given recent sugar checks, will continue current medications.  No chest pain or sob reported.  No increased cough or congestion.  No abdominal pain or bowel change.  Handling stress.  Blood pressures mostly 130-140/70-80s.     Past Medical History:  Diagnosis Date   GERD (gastroesophageal reflux disease)    Hypercholesterolemia    Hyperglycemia    Hypertension    Iron deficiency anemia    Past Surgical History:  Procedure Laterality Date   INGUINAL HERNIA REPAIR     Family History  Problem Relation Age of Onset   Hypertension Mother    Hypertension Father    CVA Paternal Grandfather    Colon cancer Neg Hx    Prostate cancer Neg Hx    Social History   Socioeconomic History   Marital status: Married    Spouse name: Not on file   Number of children: 2   Years of education: Not on file   Highest education level: Not on file  Occupational History   Not on file  Tobacco Use   Smoking status: Never   Smokeless tobacco: Never  Vaping Use   Vaping Use: Never used  Substance and Sexual Activity   Alcohol use: No    Alcohol/week: 0.0 standard drinks of alcohol   Drug use: No   Sexual activity: Not on file  Other Topics Concern   Not on file  Social History Narrative   ** Merged History Encounter **       Social Determinants of Health   Financial Resource Strain: Low Risk  (11/19/2020)   Overall Financial Resource Strain (CARDIA)    Difficulty of Paying Living  Expenses: Not hard at all  Food Insecurity: Not on file  Transportation Needs: Not on file  Physical Activity: Insufficiently Active (08/08/2020)   Exercise Vital Sign    Days of Exercise per Week: 2 days    Minutes of Exercise per Session: 40 min  Stress: Not on file  Social Connections: Not on file     Review of Systems  Constitutional:  Negative for appetite change and unexpected weight change.  HENT:  Negative for congestion and sinus pressure.   Respiratory:  Negative for cough, chest tightness and shortness of breath.   Cardiovascular:  Negative for chest pain and palpitations.  Gastrointestinal:  Negative for abdominal pain, diarrhea, nausea and vomiting.  Genitourinary:  Negative for difficulty urinating and dysuria.  Musculoskeletal:  Negative for joint swelling and myalgias.  Skin:  Negative for color change and rash.  Neurological:  Negative for dizziness and headaches.  Psychiatric/Behavioral:  Negative for agitation and dysphoric mood.        Objective:     BP 126/70   Pulse 76   Temp 98.3 F (36.8 C)   Resp 16   Ht 5\' 10"  (1.778 m)   Wt 248 lb (112.5 kg)   SpO2 98%   BMI 35.58 kg/m  Wt Readings from Last 3 Encounters:  12/24/22 248 lb (112.5 kg)  09/10/22 250 lb (113.4 kg)  06/18/22 243 lb 3.2 oz (110.3 kg)    Physical Exam Constitutional:      General: He is not in acute distress.    Appearance: Normal appearance. He is well-developed.  HENT:     Head: Normocephalic and atraumatic.     Right Ear: External ear normal.     Left Ear: External ear normal.  Eyes:     General: No scleral icterus.       Right eye: No discharge.        Left eye: No discharge.  Cardiovascular:     Rate and Rhythm: Normal rate and regular rhythm.  Pulmonary:     Effort: Pulmonary effort is normal. No respiratory distress.     Breath sounds: Normal breath sounds.  Abdominal:     General: Bowel sounds are normal.     Palpations: Abdomen is soft.     Tenderness:  There is no abdominal tenderness.  Musculoskeletal:        General: No swelling or tenderness.     Cervical back: Neck supple. No tenderness.  Lymphadenopathy:     Cervical: No cervical adenopathy.  Skin:    Findings: No erythema or rash.  Neurological:     Mental Status: He is alert.  Psychiatric:        Mood and Affect: Mood normal.        Behavior: Behavior normal.      Outpatient Encounter Medications as of 12/24/2022  Medication Sig   amLODipine (NORVASC) 10 MG tablet Take 1 tablet (10 mg total) by mouth daily.   aspirin 81 MG tablet Take 81 mg by mouth daily.   atorvastatin (LIPITOR) 40 MG tablet Take 1 tablet (40 mg total) by mouth daily.   carvedilol (COREG) 12.5 MG tablet Take 1 tablet (12.5 mg total) by mouth 2 (two) times daily with a meal.   glipiZIDE (GLIPIZIDE XL) 2.5 MG 24 hr tablet Take 1 tablet (2.5 mg total) by mouth daily with breakfast.   losartan-hydrochlorothiazide (HYZAAR) 100-12.5 MG tablet Take 1 tablet by mouth daily.   meloxicam (MOBIC) 15 MG tablet Take 1 tablet (15 mg total) by mouth daily as needed for pain.   metFORMIN (GLUCOPHAGE XR) 500 MG 24 hr tablet Take 2 tablets (1,000 mg total) by mouth 2 (two) times daily.   mupirocin ointment (BACTROBAN) 2 % Apply 1 Application topically 2 (two) times daily.   omeprazole (PRILOSEC) 40 MG capsule Take 1 capsule (40 mg total) by mouth daily.   tamsulosin (FLOMAX) 0.4 MG CAPS capsule TAKE 1 CAPSULE DAILY AFTER SUPPER   [DISCONTINUED] amLODipine (NORVASC) 5 MG tablet Take 1 tablet (5 mg total) by mouth daily.   No facility-administered encounter medications on file as of 12/24/2022.     Lab Results  Component Value Date   WBC 9.9 09/05/2022   HGB 14.0 09/05/2022   HCT 41.2 09/05/2022   PLT 296.0 09/05/2022   GLUCOSE 101 (H) 12/10/2022   CHOL 152 12/10/2022   TRIG 100.0 12/10/2022   HDL 43.60 12/10/2022   LDLCALC 88 12/10/2022   ALT 18 12/10/2022   AST 15 12/10/2022   NA 138 12/10/2022   K 3.7  12/10/2022   CL 100 12/10/2022   CREATININE 1.00 12/10/2022   BUN 14 12/10/2022   CO2 28 12/10/2022   TSH 1.70 09/05/2022   PSA 1.98 09/05/2022   HGBA1C 7.2 (H) 12/10/2022   MICROALBUR 1.0 04/11/2022  Assessment & Plan:  Type 2 diabetes mellitus with hyperglycemia, without long-term current use of insulin ([facility]) Assessment & Plan: Low carb diet and exercise. Changed metformin to XR. Loose stools improved.  Desires no injections.  Reaction to jardiance. Was started on glipizide 2.5mg  q day. Reviewed outside sugar readings. Improved. Follow met b and a1c.  Hold on making changes.  A1c improved - 7.2. follow.    Orders: -     Hemoglobin A1c; Future  Hypercholesterolemia Assessment & Plan: On lipitor.  Low cholesterol diet and exercise.  Follow lipid panel and liver function tests.    Orders: -     Basic metabolic panel; Future -     Lipid panel; Future -     Hepatic function panel; Future  Anemia, unspecified type Assessment & Plan: Follow cbc.    Gastroesophageal reflux disease, unspecified whether esophagitis present Assessment & Plan: No upper symptoms reported.  On omeprazole.     Primary hypertension Assessment & Plan: On losartan/hctz, coreg and amlodipine 5mg  q day. Pressures remaining elevated.  Increase amlodipine to 10mg  q day.  Follow pressures.  Follow metabolic panel.   Other orders -     amLODIPine Besylate; Take 1 tablet (10 mg total) by mouth daily.  Dispense: 90 tablet; Refill: 1     Einar Pheasant, MD

## 2022-12-28 ENCOUNTER — Encounter: Payer: Self-pay | Admitting: Internal Medicine

## 2022-12-28 NOTE — Assessment & Plan Note (Signed)
Low carb diet and exercise. Changed metformin to XR. Loose stools improved.  Desires no injections.  Reaction to jardiance. Was started on glipizide 2.5mg  q day. Reviewed outside sugar readings. Improved. Follow met b and a1c.  Hold on making changes.  A1c improved - 7.2. follow.

## 2022-12-28 NOTE — Assessment & Plan Note (Signed)
On lipitor.  Low cholesterol diet and exercise.  Follow lipid panel and liver function tests.   

## 2022-12-28 NOTE — Assessment & Plan Note (Signed)
No upper symptoms reported.  On omeprazole.  

## 2022-12-28 NOTE — Assessment & Plan Note (Signed)
On losartan/hctz, coreg and amlodipine 5mg  q day. Pressures remaining elevated.  Increase amlodipine to 10mg  q day.  Follow pressures.  Follow metabolic panel.

## 2022-12-28 NOTE — Assessment & Plan Note (Signed)
Follow cbc.  

## 2023-02-02 ENCOUNTER — Other Ambulatory Visit: Payer: Self-pay | Admitting: Internal Medicine

## 2023-03-27 ENCOUNTER — Other Ambulatory Visit (INDEPENDENT_AMBULATORY_CARE_PROVIDER_SITE_OTHER): Payer: 59

## 2023-03-27 DIAGNOSIS — E1165 Type 2 diabetes mellitus with hyperglycemia: Secondary | ICD-10-CM

## 2023-03-27 DIAGNOSIS — E7800 Pure hypercholesterolemia, unspecified: Secondary | ICD-10-CM | POA: Diagnosis not present

## 2023-03-27 LAB — LIPID PANEL
Cholesterol: 128 mg/dL (ref 0–200)
HDL: 40.4 mg/dL (ref >39.00)
LDL Cholesterol: 68 mg/dL (ref 0–99)
NonHDL: 87.42
Total CHOL/HDL Ratio: 3
Triglycerides: 95 mg/dL (ref 0.0–149.0)
VLDL: 19 mg/dL (ref 0.0–40.0)

## 2023-03-27 LAB — BASIC METABOLIC PANEL
BUN: 15 mg/dL (ref 6–23)
CO2: 29 mEq/L (ref 19–32)
Calcium: 9.4 mg/dL (ref 8.4–10.5)
Chloride: 101 mEq/L (ref 96–112)
Creatinine, Ser: 1.08 mg/dL (ref 0.40–1.50)
GFR: 75.97 mL/min (ref >60.00)
Glucose, Bld: 109 mg/dL — ABNORMAL HIGH (ref 70–99)
Potassium: 3.8 mEq/L (ref 3.5–5.1)
Sodium: 138 mEq/L (ref 135–145)

## 2023-03-27 LAB — HEPATIC FUNCTION PANEL
ALT: 18 U/L (ref 0–53)
AST: 15 U/L (ref 0–37)
Albumin: 3.9 g/dL (ref 3.5–5.2)
Alkaline Phosphatase: 74 U/L (ref 39–117)
Bilirubin, Direct: 0.2 mg/dL (ref 0.0–0.3)
Total Bilirubin: 0.8 mg/dL (ref 0.2–1.2)
Total Protein: 7 g/dL (ref 6.0–8.3)

## 2023-03-27 LAB — HEMOGLOBIN A1C: Hgb A1c MFr Bld: 7.3 % — ABNORMAL HIGH (ref 4.6–6.5)

## 2023-04-01 ENCOUNTER — Ambulatory Visit (INDEPENDENT_AMBULATORY_CARE_PROVIDER_SITE_OTHER): Payer: 59 | Admitting: Internal Medicine

## 2023-04-01 ENCOUNTER — Encounter: Payer: Self-pay | Admitting: Internal Medicine

## 2023-04-01 VITALS — BP 138/82 | HR 70 | Temp 97.9°F | Resp 16 | Ht 70.0 in | Wt 255.0 lb

## 2023-04-01 DIAGNOSIS — I10 Essential (primary) hypertension: Secondary | ICD-10-CM

## 2023-04-01 DIAGNOSIS — K219 Gastro-esophageal reflux disease without esophagitis: Secondary | ICD-10-CM | POA: Diagnosis not present

## 2023-04-01 DIAGNOSIS — E7800 Pure hypercholesterolemia, unspecified: Secondary | ICD-10-CM

## 2023-04-01 DIAGNOSIS — E1165 Type 2 diabetes mellitus with hyperglycemia: Secondary | ICD-10-CM

## 2023-04-01 DIAGNOSIS — Z7984 Long term (current) use of oral hypoglycemic drugs: Secondary | ICD-10-CM

## 2023-04-01 MED ORDER — AMLODIPINE BESYLATE 10 MG PO TABS: 10.0000 mg | ORAL_TABLET | Freq: Every day | ORAL | 1 refills | Status: DC

## 2023-04-01 NOTE — Progress Notes (Signed)
Subjective:    Patient ID: Aaron Deer., male    DOB: 26-Dec-1965, 57 y.o.   MRN: [id number]  Patient here for  Chief Complaint  Patient presents with   Medical Management of Chronic Issues    HPI Here for follow up regarding diabetes, hypertension and hypercholesterolemia. Now on amlodipine 10mg  q day.  Continues on carvedilol and losartan/hydrochlorothiazide.  Blood pressures reviewed.  Most readings 120-130s/70-80s.  Recent check - 150.  Reviewed recent labs.  A1c 7.3.  discussed diet and exercise.  Increased stress.  Father is in the hospital again.  He is working and trying to take care of multiple yards, etc.  He feels he is handling things relatively well. No chest pain or sob reported.  No cough or congestion.  No abdominal pain or bowel change reported.  He has adjusted diet - helping keep bowels moving.     Past Medical History:  Diagnosis Date   GERD (gastroesophageal reflux disease)    Hypercholesterolemia    Hyperglycemia    Hypertension    Iron deficiency anemia    Past Surgical History:  Procedure Laterality Date   INGUINAL HERNIA REPAIR     Family History  Problem Relation Age of Onset   Hypertension Mother    Hypertension Father    CVA Paternal Grandfather    Colon cancer Neg Hx    Prostate cancer Neg Hx    Social History   Socioeconomic History   Marital status: Married    Spouse name: Not on file   Number of children: 2   Years of education: Not on file   Highest education level: Not on file  Occupational History   Not on file  Tobacco Use   Smoking status: Never   Smokeless tobacco: Never  Vaping Use   Vaping Use: Never used  Substance and Sexual Activity   Alcohol use: No    Alcohol/week: 0.0 standard drinks of alcohol   Drug use: No   Sexual activity: Not on file  Other Topics Concern   Not on file  Social History Narrative   ** Merged History Encounter **       Social Determinants of Health   Financial Resource Strain: Low Risk   (11/19/2020)   Overall Financial Resource Strain (CARDIA)    Difficulty of Paying Living Expenses: Not hard at all  Food Insecurity: Not on file  Transportation Needs: Not on file  Physical Activity: Insufficiently Active (08/08/2020)   Exercise Vital Sign    Days of Exercise per Week: 2 days    Minutes of Exercise per Session: 40 min  Stress: Not on file  Social Connections: Not on file     Review of Systems  Constitutional:  Negative for appetite change and unexpected weight change.  HENT:  Negative for congestion and sinus pressure.   Respiratory:  Negative for cough, chest tightness and shortness of breath.   Cardiovascular:  Negative for chest pain, palpitations and leg swelling.  Gastrointestinal:  Negative for abdominal pain, diarrhea, nausea and vomiting.  Genitourinary:  Negative for difficulty urinating and dysuria.  Musculoskeletal:  Negative for joint swelling and myalgias.  Skin:  Negative for color change and rash.  Neurological:  Negative for dizziness and headaches.  Psychiatric/Behavioral:  Negative for agitation and dysphoric mood.        Objective:     BP 138/82   Pulse 70   Temp 97.9 F (36.6 C)   Resp 16   Ht 5'  10" (1.778 m)   Wt 255 lb (115.7 kg)   SpO2 98%   BMI 36.59 kg/m  Wt Readings from Last 3 Encounters:  04/01/23 255 lb (115.7 kg)  12/24/22 248 lb (112.5 kg)  09/10/22 250 lb (113.4 kg)    Physical Exam Constitutional:      General: He is not in acute distress.    Appearance: Normal appearance. He is well-developed.  HENT:     Head: Normocephalic and atraumatic.     Right Ear: External ear normal.     Left Ear: External ear normal.  Eyes:     General: No scleral icterus.       Right eye: No discharge.        Left eye: No discharge.  Cardiovascular:     Rate and Rhythm: Normal rate and regular rhythm.  Pulmonary:     Effort: Pulmonary effort is normal. No respiratory distress.     Breath sounds: Normal breath sounds.   Abdominal:     General: Bowel sounds are normal.     Palpations: Abdomen is soft.     Tenderness: There is no abdominal tenderness.  Musculoskeletal:        General: No swelling or tenderness.     Cervical back: Neck supple. No tenderness.     Comments: DP pulses palpable and equal bilaterally   Lymphadenopathy:     Cervical: No cervical adenopathy.  Skin:    Findings: No erythema or rash.  Neurological:     Mental Status: He is alert.  Psychiatric:        Mood and Affect: Mood normal.        Behavior: Behavior normal.      Outpatient Encounter Medications as of 04/01/2023  Medication Sig   amLODipine (NORVASC) 10 MG tablet Take 1 tablet (10 mg total) by mouth daily.   aspirin 81 MG tablet Take 81 mg by mouth daily.   atorvastatin (LIPITOR) 40 MG tablet Take 1 tablet (40 mg total) by mouth daily.   carvedilol (COREG) 12.5 MG tablet Take 1 tablet (12.5 mg total) by mouth 2 (two) times daily with a meal.   glipiZIDE (GLIPIZIDE XL) 2.5 MG 24 hr tablet Take 1 tablet (2.5 mg total) by mouth daily with breakfast.   losartan-hydrochlorothiazide (HYZAAR) 100-12.5 MG tablet Take 1 tablet by mouth daily.   meloxicam (MOBIC) 15 MG tablet Take 1 tablet (15 mg total) by mouth daily as needed for pain.   metFORMIN (GLUCOPHAGE-XR) 500 MG 24 hr tablet TAKE 2 TABLETS TWICE A DAY   mupirocin ointment (BACTROBAN) 2 % Apply 1 Application topically 2 (two) times daily.   omeprazole (PRILOSEC) 40 MG capsule Take 1 capsule (40 mg total) by mouth daily.   tamsulosin (FLOMAX) 0.4 MG CAPS capsule TAKE 1 CAPSULE DAILY AFTER SUPPER   [DISCONTINUED] amLODipine (NORVASC) 10 MG tablet Take 1 tablet (10 mg total) by mouth daily.   No facility-administered encounter medications on file as of 04/01/2023.     Lab Results  Component Value Date   WBC 9.9 09/05/2022   HGB 14.0 09/05/2022   HCT 41.2 09/05/2022   PLT 296.0 09/05/2022   GLUCOSE 109 (H) 03/27/2023   CHOL 128 03/27/2023   TRIG 95.0 03/27/2023    HDL 40.40 03/27/2023   LDLCALC 68 03/27/2023   ALT 18 03/27/2023   AST 15 03/27/2023   NA 138 03/27/2023   K 3.8 03/27/2023   CL 101 03/27/2023   CREATININE 1.08 03/27/2023   BUN  15 03/27/2023   CO2 29 03/27/2023   TSH 1.70 09/05/2022   PSA 1.98 09/05/2022   HGBA1C 7.3 (H) 03/27/2023   MICROALBUR 1.0 04/11/2022    No results found.     Assessment & Plan:  Type 2 diabetes mellitus with hyperglycemia, without long-term current use of insulin (HCC) Assessment & Plan: Low carb diet and exercise. Changed metformin to XR. Loose stools improved.  Desires no injections.  Reaction to jardiance. Was started on glipizide 2.5mg  q day. A1c 7.3. discussed further medication.  He would like to work on diet and exercise, before changing medication.  Follow met b and a1c.  Hold on making changes.   Orders: -     Hemoglobin A1c; Future -     Microalbumin / creatinine urine ratio; Future  Hypercholesterolemia Assessment & Plan: On lipitor.  Low cholesterol diet and exercise.  Follow lipid panel and liver function tests.    Orders: -     Lipid panel; Future -     Hepatic function panel; Future -     Basic metabolic panel; Future -     TSH; Future  Gastroesophageal reflux disease, unspecified whether esophagitis present Assessment & Plan: No upper symptoms reported.  On omeprazole.     Primary hypertension Assessment & Plan: On losartan/hctz, coreg and amlodipine 10mg  q day.  Follow pressures.  Follow metabolic panel.   Other orders -     amLODIPine Besylate; Take 1 tablet (10 mg total) by mouth daily.  Dispense: 90 tablet; Refill: 1     Dale Eddy, MD

## 2023-04-01 NOTE — Assessment & Plan Note (Addendum)
Low carb diet and exercise. Changed metformin to XR. Loose stools improved.  Desires no injections.  Reaction to jardiance. Was started on glipizide 2.5mg  q day. A1c 7.3. discussed further medication.  He would like to work on diet and exercise, before changing medication.  Follow met b and a1c.  Hold on making changes.

## 2023-04-05 ENCOUNTER — Encounter: Payer: Self-pay | Admitting: Internal Medicine

## 2023-04-05 NOTE — Assessment & Plan Note (Signed)
No upper symptoms reported.  On omeprazole.  

## 2023-04-05 NOTE — Assessment & Plan Note (Signed)
On lipitor.  Low cholesterol diet and exercise.  Follow lipid panel and liver function tests.   

## 2023-04-05 NOTE — Assessment & Plan Note (Signed)
On losartan/hctz, coreg and amlodipine 10mg  q day.  Follow pressures.  Follow metabolic panel.

## 2023-04-07 ENCOUNTER — Encounter: Payer: Self-pay | Admitting: Internal Medicine

## 2023-06-04 ENCOUNTER — Other Ambulatory Visit: Payer: Self-pay | Admitting: Internal Medicine

## 2023-06-25 ENCOUNTER — Other Ambulatory Visit: Payer: Self-pay | Admitting: Internal Medicine

## 2023-07-03 ENCOUNTER — Other Ambulatory Visit (INDEPENDENT_AMBULATORY_CARE_PROVIDER_SITE_OTHER): Payer: 59

## 2023-07-03 DIAGNOSIS — E1165 Type 2 diabetes mellitus with hyperglycemia: Secondary | ICD-10-CM | POA: Diagnosis not present

## 2023-07-03 DIAGNOSIS — E7800 Pure hypercholesterolemia, unspecified: Secondary | ICD-10-CM

## 2023-07-03 LAB — HEMOGLOBIN A1C: Hgb A1c MFr Bld: 7.4 % — ABNORMAL HIGH (ref 4.6–6.5)

## 2023-07-03 LAB — HEPATIC FUNCTION PANEL
ALT: 15 U/L (ref 0–53)
AST: 16 U/L (ref 0–37)
Albumin: 3.8 g/dL (ref 3.5–5.2)
Alkaline Phosphatase: 86 U/L (ref 39–117)
Bilirubin, Direct: 0.2 mg/dL (ref 0.0–0.3)
Total Bilirubin: 0.9 mg/dL (ref 0.2–1.2)
Total Protein: 6.7 g/dL (ref 6.0–8.3)

## 2023-07-03 LAB — LIPID PANEL
Cholesterol: 132 mg/dL (ref 0–200)
HDL: 42.5 mg/dL (ref >39.00)
LDL Cholesterol: 67 mg/dL (ref 0–99)
NonHDL: 89.23
Total CHOL/HDL Ratio: 3
Triglycerides: 111 mg/dL (ref 0.0–149.0)
VLDL: 22.2 mg/dL (ref 0.0–40.0)

## 2023-07-03 LAB — BASIC METABOLIC PANEL
BUN: 13 mg/dL (ref 6–23)
CO2: 30 meq/L (ref 19–32)
Calcium: 9.1 mg/dL (ref 8.4–10.5)
Chloride: 99 meq/L (ref 96–112)
Creatinine, Ser: 1.08 mg/dL (ref 0.40–1.50)
GFR: 75.83 mL/min (ref >60.00)
Glucose, Bld: 115 mg/dL — ABNORMAL HIGH (ref 70–99)
Potassium: 3.8 meq/L (ref 3.5–5.1)
Sodium: 138 meq/L (ref 135–145)

## 2023-07-03 LAB — MICROALBUMIN / CREATININE URINE RATIO
Creatinine,U: 133.3 mg/dL
Microalb Creat Ratio: 0.5 mg/g (ref 0.0–30.0)
Microalb, Ur: <0.7 mg/dL (ref 0.0–1.9)

## 2023-07-03 LAB — TSH: TSH: 2.43 u[IU]/mL (ref 0.35–5.50)

## 2023-07-06 ENCOUNTER — Telehealth: Payer: Self-pay | Admitting: *Deleted

## 2023-07-06 ENCOUNTER — Encounter: Payer: Self-pay | Admitting: *Deleted

## 2023-07-06 NOTE — Telephone Encounter (Signed)
Left voicemail to return call & sent to [place] (last logged into [place] in January)

## 2023-07-06 NOTE — Telephone Encounter (Addendum)
-----   Message from [place] sent at 07/05/2023  7:08 PM EDT -----  Your overall sugar control increased from recent checks. Dr. Lorin Picket would like for you to check and record sugars and send in readings. Cholesterol levels look good. Thyroid test, kidney function tests and liver function tests are normal.

## 2023-07-06 NOTE — Telephone Encounter (Signed)
-----   Message from [place] sent at 07/05/2023  7:08 PM EDT ----- Notify - overall sugar control increased from recent checks.  Have him check and record sugars and send in readings. Cholesterol levels look good. Thyroid test, kidney function tests and liver function tests are wnl.

## 2023-07-08 ENCOUNTER — Ambulatory Visit: Payer: 59 | Admitting: Internal Medicine

## 2023-07-09 ENCOUNTER — Encounter: Payer: Self-pay | Admitting: *Deleted

## 2023-07-16 ENCOUNTER — Encounter: Payer: Self-pay | Admitting: Internal Medicine

## 2023-07-16 ENCOUNTER — Ambulatory Visit: Payer: 59 | Admitting: Internal Medicine

## 2023-07-16 VITALS — BP 128/78 | HR 85 | Temp 98.0°F | Resp 16 | Ht 70.0 in | Wt 256.0 lb

## 2023-07-16 DIAGNOSIS — D649 Anemia, unspecified: Secondary | ICD-10-CM

## 2023-07-16 DIAGNOSIS — E7800 Pure hypercholesterolemia, unspecified: Secondary | ICD-10-CM | POA: Diagnosis not present

## 2023-07-16 DIAGNOSIS — T7840XA Allergy, unspecified, initial encounter: Secondary | ICD-10-CM | POA: Insufficient documentation

## 2023-07-16 DIAGNOSIS — T7840XD Allergy, unspecified, subsequent encounter: Secondary | ICD-10-CM

## 2023-07-16 DIAGNOSIS — E1165 Type 2 diabetes mellitus with hyperglycemia: Secondary | ICD-10-CM

## 2023-07-16 DIAGNOSIS — Z7984 Long term (current) use of oral hypoglycemic drugs: Secondary | ICD-10-CM

## 2023-07-16 DIAGNOSIS — K219 Gastro-esophageal reflux disease without esophagitis: Secondary | ICD-10-CM | POA: Diagnosis not present

## 2023-07-16 DIAGNOSIS — I10 Essential (primary) hypertension: Secondary | ICD-10-CM

## 2023-07-16 LAB — HM DIABETES FOOT EXAM

## 2023-07-16 MED ORDER — MELOXICAM 15 MG PO TABS: 15.0000 mg | ORAL_TABLET | Freq: Every day | ORAL | 0 refills | Status: AC | PRN

## 2023-07-16 NOTE — Assessment & Plan Note (Signed)
Follow cbc.

## 2023-07-16 NOTE — Assessment & Plan Note (Signed)
No upper symptoms reported.  On omeprazole.

## 2023-07-16 NOTE — Assessment & Plan Note (Signed)
Had presumed allergic reaction as outlined.  He relates to garlic.  Discussed epi pen.  He declines. Discussed referral to an allergist.  Declines.  Keeps benadryl.  Wants to see if he avoids garlic, if symptoms resolve.  Will notify me if changes his mind.

## 2023-07-16 NOTE — Progress Notes (Signed)
Subjective:    Patient ID: Aaron Deer., male    DOB: 17-Sep-1966, 57 y.o.   MRN: [id number]  Patient here for  Chief Complaint  Patient presents with   Medical Management of Chronic Issues    HPI Here for follow up regarding diabetes, hypertension and hypercholesterolemia. Now on amlodipine 10mg  q day.  Continues on carvedilol and losartan/hydrochlorothiazide. On metformin XR.  Unable to take jardiance (reaction).  On glipizide 2.5mg  q day. Discussed recent labs.  A1c 7.4.  increased.  Discussed medication adjustment and discuss starting GLP 1 agonist. No chest pain or sob.  Stays busy.  Stays physically active.  No cough or congestion.  No abdominal pain or bowel change.  Had reaction to garlic? - on two occasions.  Throat felt sore and like it was swelling.  No sob.  No trouble breathing.  States he sprayed chloraseptic both times and symptoms resolved.     Past Medical History:  Diagnosis Date   GERD (gastroesophageal reflux disease)    Hypercholesterolemia    Hyperglycemia    Hypertension    Iron deficiency anemia    Past Surgical History:  Procedure Laterality Date   INGUINAL HERNIA REPAIR     Family History  Problem Relation Age of Onset   Hypertension Mother    Hypertension Father    CVA Paternal Grandfather    Colon cancer Neg Hx    Prostate cancer Neg Hx    Social History   Socioeconomic History   Marital status: Married    Spouse name: Not on file   Number of children: 2   Years of education: Not on file   Highest education level: Not on file  Occupational History   Not on file  Tobacco Use   Smoking status: Never   Smokeless tobacco: Never  Vaping Use   Vaping status: Never Used  Substance and Sexual Activity   Alcohol use: No    Alcohol/week: 0.0 standard drinks of alcohol   Drug use: No   Sexual activity: Not on file  Other Topics Concern   Not on file  Social History Narrative   ** Merged History Encounter **       Social Determinants of  Health   Financial Resource Strain: Low Risk  (11/19/2020)   Overall Financial Resource Strain (CARDIA)    Difficulty of Paying Living Expenses: Not hard at all  Food Insecurity: Not on file  Transportation Needs: Not on file  Physical Activity: Insufficiently Active (08/08/2020)   Exercise Vital Sign    Days of Exercise per Week: 2 days    Minutes of Exercise per Session: 40 min  Stress: Not on file  Social Connections: Not on file     Review of Systems  Constitutional:  Negative for appetite change and unexpected weight change.  HENT:  Negative for congestion and sinus pressure.   Respiratory:  Negative for cough, chest tightness and shortness of breath.   Cardiovascular:  Negative for chest pain, palpitations and leg swelling.  Gastrointestinal:  Negative for abdominal pain, diarrhea, nausea and vomiting.  Genitourinary:  Negative for difficulty urinating and dysuria.  Musculoskeletal:  Negative for joint swelling and myalgias.       Occasional knee pain.  Takes meloxicam prn.  Rarely uses.   Skin:  Negative for color change and rash.  Neurological:  Negative for dizziness and headaches.  Psychiatric/Behavioral:  Negative for agitation and dysphoric mood.        Objective:  BP 128/78   Pulse 85   Temp 98 F (36.7 C)   Resp 16   Ht 5\' 10"  (1.778 m)   Wt 256 lb (116.1 kg)   SpO2 98%   BMI 36.73 kg/m  Wt Readings from Last 3 Encounters:  07/16/23 256 lb (116.1 kg)  04/01/23 255 lb (115.7 kg)  12/24/22 248 lb (112.5 kg)    Physical Exam Vitals reviewed.  Constitutional:      General: He is not in acute distress.    Appearance: Normal appearance. He is well-developed.  HENT:     Head: Normocephalic and atraumatic.     Right Ear: External ear normal.     Left Ear: External ear normal.  Eyes:     General: No scleral icterus.       Right eye: No discharge.        Left eye: No discharge.     Conjunctiva/sclera: Conjunctivae normal.  Cardiovascular:      Rate and Rhythm: Normal rate and regular rhythm.  Pulmonary:     Effort: Pulmonary effort is normal. No respiratory distress.     Breath sounds: Normal breath sounds.  Abdominal:     General: Bowel sounds are normal.     Palpations: Abdomen is soft.     Tenderness: There is no abdominal tenderness.  Musculoskeletal:        General: No swelling or tenderness.     Cervical back: Neck supple. No tenderness.  Lymphadenopathy:     Cervical: No cervical adenopathy.  Skin:    Findings: No erythema or rash.  Neurological:     Mental Status: He is alert.  Psychiatric:        Mood and Affect: Mood normal.        Behavior: Behavior normal.      Outpatient Encounter Medications as of 07/16/2023  Medication Sig   amLODipine (NORVASC) 10 MG tablet TAKE 1 TABLET DAILY   aspirin 81 MG tablet Take 81 mg by mouth daily.   atorvastatin (LIPITOR) 40 MG tablet Take 1 tablet (40 mg total) by mouth daily.   carvedilol (COREG) 12.5 MG tablet TAKE 1 TABLET TWICE A DAY WITH MEALS   glipiZIDE (GLUCOTROL XL) 2.5 MG 24 hr tablet TAKE 1 TABLET DAILY WITH BREAKFAST   losartan-hydrochlorothiazide (HYZAAR) 100-12.5 MG tablet Take 1 tablet by mouth daily.   meloxicam (MOBIC) 15 MG tablet Take 1 tablet (15 mg total) by mouth daily as needed for pain.   metFORMIN (GLUCOPHAGE-XR) 500 MG 24 hr tablet TAKE 2 TABLETS TWICE A DAY   mupirocin ointment (BACTROBAN) 2 % Apply 1 Application topically 2 (two) times daily.   omeprazole (PRILOSEC) 40 MG capsule Take 1 capsule (40 mg total) by mouth daily.   tamsulosin (FLOMAX) 0.4 MG CAPS capsule TAKE 1 CAPSULE DAILY AFTER SUPPER   [DISCONTINUED] meloxicam (MOBIC) 15 MG tablet Take 1 tablet (15 mg total) by mouth daily as needed for pain.   No facility-administered encounter medications on file as of 07/16/2023.     Lab Results  Component Value Date   WBC 9.9 09/05/2022   HGB 14.0 09/05/2022   HCT 41.2 09/05/2022   PLT 296.0 09/05/2022   GLUCOSE 115 (H) 07/03/2023    CHOL 132 07/03/2023   TRIG 111.0 07/03/2023   HDL 42.50 07/03/2023   LDLCALC 67 07/03/2023   ALT 15 07/03/2023   AST 16 07/03/2023   NA 138 07/03/2023   K 3.8 07/03/2023   CL 99 07/03/2023  CREATININE 1.08 07/03/2023   BUN 13 07/03/2023   CO2 30 07/03/2023   TSH 2.43 07/03/2023   PSA 1.98 09/05/2022   HGBA1C 7.4 (H) 07/03/2023   MICROALBUR <0.7 07/03/2023       Assessment & Plan:  Primary hypertension Assessment & Plan: On losartan/hctz, coreg and amlodipine 10mg  q day.  Follow pressures.  Follow metabolic panel. Pulse recheck 60.    Hypercholesterolemia Assessment & Plan: On lipitor.  Low cholesterol diet and exercise.  Follow lipid panel and liver function tests.     Gastroesophageal reflux disease, unspecified whether esophagitis present Assessment & Plan: No upper symptoms reported.  On omeprazole.     Type 2 diabetes mellitus with hyperglycemia, without long-term current use of insulin (HCC) Assessment & Plan: Low carb diet and exercise. Changed metformin to XR. Loose stools improved.  Doing well on current metformin dosing. Reaction to jardiance. Was started on glipizide 2.5mg  q day. A1c 7.4. discussed further medication.  He would like to work on diet and exercise, before changing medication.  Discussed starting GLP 1 agonist and benefit of medication.  Wants to talk with his wife.  Will notify me if agreeable to start.  Wants to hold on making other changes. Diet and exercise. Follow met b and a1c.     Anemia, unspecified type Assessment & Plan: Follow cbc.    Allergic reaction, subsequent encounter Assessment & Plan: Had presumed allergic reaction as outlined.  He relates to garlic.  Discussed epi pen.  He declines. Discussed referral to an allergist.  Declines.  Keeps benadryl.  Wants to see if he avoids garlic, if symptoms resolve.  Will notify me if changes his mind.    Other orders -     Meloxicam; Take 1 tablet (15 mg total) by mouth daily as needed  for pain.  Dispense: 30 tablet; Refill: 0     Dale [employer], MD

## 2023-07-16 NOTE — Assessment & Plan Note (Signed)
On lipitor.  Low cholesterol diet and exercise.  Follow lipid panel and liver function tests.

## 2023-07-16 NOTE — Assessment & Plan Note (Signed)
Low carb diet and exercise. Changed metformin to XR. Loose stools improved.  Doing well on current metformin dosing. Reaction to jardiance. Was started on glipizide 2.5mg  q day. A1c 7.4. discussed further medication.  He would like to work on diet and exercise, before changing medication.  Discussed starting GLP 1 agonist and benefit of medication.  Wants to talk with his wife.  Will notify me if agreeable to start.  Wants to hold on making other changes. Diet and exercise. Follow met b and a1c.

## 2023-07-16 NOTE — Assessment & Plan Note (Signed)
On losartan/hctz, coreg and amlodipine 10mg  q day.  Follow pressures.  Follow metabolic panel. Pulse recheck 60.

## 2023-09-02 ENCOUNTER — Other Ambulatory Visit: Payer: Self-pay | Admitting: Internal Medicine

## 2023-10-12 ENCOUNTER — Telehealth: Payer: Self-pay | Admitting: Internal Medicine

## 2023-10-12 ENCOUNTER — Other Ambulatory Visit: Payer: Self-pay

## 2023-10-12 MED ORDER — LOSARTAN POTASSIUM-HCTZ 100-12.5 MG PO TABS: 1.0000 | ORAL_TABLET | Freq: Every day | ORAL | 3 refills | Status: DC

## 2023-10-12 MED ORDER — TAMSULOSIN HCL 0.4 MG PO CAPS: ORAL_CAPSULE | ORAL | 3 refills | Status: DC

## 2023-10-12 NOTE — Telephone Encounter (Signed)
 Copied from CRM [id number]. Topic: Clinical - Medication Refill >> Oct 12, 2023 12:05 PM Corean SAUNDERS wrote: Most Recent Primary Care Visit:  Provider: SCOTT, CHARLENE  Department: [facility]  Visit Type: OFFICE VISIT  Date: 07/16/2023  Medication: losartan -hydrochlorothiazide (HYZAAR) 100-12.5 MG tablet tamsulosin  (FLOMAX ) 0.4 MG CAPS capsule    Has the patient contacted their pharmacy? Yes, prescription is expired. (Agent: If no, request that the patient contact the pharmacy for the refill. If patient does not wish to contact the pharmacy document the reason why and proceed with request.) (Agent: If yes, when and what did the pharmacy advise?)  Is this the correct pharmacy for this prescription? Yes If no, delete pharmacy and type the correct one.  This is the patient's preferred pharmacy:    [facility] - [place], [place] - [address] [place] [address] Phone: [phone] Fax: [phone]   Has the prescription been filled recently? Yes  Is the patient out of the medication? No  Has the patient been seen for an appointment in the last year OR does the patient have an upcoming appointment? Yes  Can we respond through MyChart? Yes  Agent: Please be advised that Rx refills may take up to 3 business days. We ask that you follow-up with your pharmacy.

## 2023-10-12 NOTE — Telephone Encounter (Signed)
 Medication refilled

## 2023-10-12 NOTE — Telephone Encounter (Signed)
 Patient need lab orders.

## 2023-10-19 ENCOUNTER — Other Ambulatory Visit: Payer: Self-pay | Admitting: Internal Medicine

## 2023-10-20 ENCOUNTER — Telehealth: Payer: Self-pay | Admitting: Internal Medicine

## 2023-10-20 DIAGNOSIS — Z125 Encounter for screening for malignant neoplasm of prostate: Secondary | ICD-10-CM

## 2023-10-20 DIAGNOSIS — D649 Anemia, unspecified: Secondary | ICD-10-CM

## 2023-10-20 DIAGNOSIS — E7800 Pure hypercholesterolemia, unspecified: Secondary | ICD-10-CM

## 2023-10-20 DIAGNOSIS — E1165 Type 2 diabetes mellitus with hyperglycemia: Secondary | ICD-10-CM

## 2023-10-20 NOTE — Telephone Encounter (Signed)
orders

## 2023-10-20 NOTE — Addendum Note (Signed)
Addended by: Rita Ohara D on: 10/20/2023 01:47 PM   Modules accepted: Orders

## 2023-10-21 ENCOUNTER — Other Ambulatory Visit: Payer: 59

## 2023-10-21 DIAGNOSIS — D649 Anemia, unspecified: Secondary | ICD-10-CM

## 2023-10-21 DIAGNOSIS — E1165 Type 2 diabetes mellitus with hyperglycemia: Secondary | ICD-10-CM

## 2023-10-21 DIAGNOSIS — E7800 Pure hypercholesterolemia, unspecified: Secondary | ICD-10-CM | POA: Diagnosis not present

## 2023-10-21 DIAGNOSIS — Z125 Encounter for screening for malignant neoplasm of prostate: Secondary | ICD-10-CM

## 2023-10-21 LAB — BASIC METABOLIC PANEL
BUN: 16 mg/dL (ref 6–23)
CO2: 30 meq/L (ref 19–32)
Calcium: 9.4 mg/dL (ref 8.4–10.5)
Chloride: 102 meq/L (ref 96–112)
Creatinine, Ser: 1.02 mg/dL (ref 0.40–1.50)
GFR: 81.04 mL/min (ref >60.00)
Glucose, Bld: 112 mg/dL — ABNORMAL HIGH (ref 70–99)
Potassium: 4 meq/L (ref 3.5–5.1)
Sodium: 141 meq/L (ref 135–145)

## 2023-10-21 LAB — CBC WITH DIFFERENTIAL/PLATELET
Basophils Absolute: 0.1 10*3/uL (ref 0.0–0.1)
Basophils Relative: 0.7 % (ref 0.0–3.0)
Eosinophils Absolute: 0.3 10*3/uL (ref 0.0–0.7)
Eosinophils Relative: 3.1 % (ref 0.0–5.0)
HCT: 41.2 % (ref 39.0–52.0)
Hemoglobin: 13.9 g/dL (ref 13.0–17.0)
Lymphocytes Relative: 29 % (ref 12.0–46.0)
Lymphs Abs: 2.7 10*3/uL (ref 0.7–4.0)
MCHC: 33.7 g/dL (ref 30.0–36.0)
MCV: 87.4 fL (ref 78.0–100.0)
Monocytes Absolute: 0.7 10*3/uL (ref 0.1–1.0)
Monocytes Relative: 7.8 % (ref 3.0–12.0)
Neutro Abs: 5.5 10*3/uL (ref 1.4–7.7)
Neutrophils Relative %: 59.4 % (ref 43.0–77.0)
Platelets: 337 10*3/uL (ref 150.0–400.0)
RBC: 4.71 Mil/uL (ref 4.22–5.81)
RDW: 14 % (ref 11.5–15.5)
WBC: 9.3 10*3/uL (ref 4.0–10.5)

## 2023-10-21 LAB — HEPATIC FUNCTION PANEL
ALT: 14 U/L (ref 0–53)
AST: 13 U/L (ref 0–37)
Albumin: 4 g/dL (ref 3.5–5.2)
Alkaline Phosphatase: 85 U/L (ref 39–117)
Bilirubin, Direct: 0.1 mg/dL (ref 0.0–0.3)
Total Bilirubin: 0.6 mg/dL (ref 0.2–1.2)
Total Protein: 6.9 g/dL (ref 6.0–8.3)

## 2023-10-21 LAB — LIPID PANEL
Cholesterol: 146 mg/dL (ref 0–200)
HDL: 39.2 mg/dL (ref >39.00)
LDL Cholesterol: 80 mg/dL (ref 0–99)
NonHDL: 106.71
Total CHOL/HDL Ratio: 4
Triglycerides: 134 mg/dL (ref 0.0–149.0)
VLDL: 26.8 mg/dL (ref 0.0–40.0)

## 2023-10-21 LAB — HEMOGLOBIN A1C: Hgb A1c MFr Bld: 7.7 % — ABNORMAL HIGH (ref 4.6–6.5)

## 2023-10-21 LAB — PSA: PSA: 2.49 ng/mL (ref 0.10–4.00)

## 2023-10-23 ENCOUNTER — Telehealth: Payer: Self-pay

## 2023-10-23 ENCOUNTER — Encounter: Payer: Self-pay | Admitting: Internal Medicine

## 2023-10-23 NOTE — Telephone Encounter (Signed)
I left voicemail for patient letting him know that his appointment on 10/27/2023 at 8:00am with Dr. Dale [place] needs to be rescheduled because she will be out of the office during that time.  I asked patient to please call us.  I also sent a letter to patient via MyChart with this information.  When patient calls back, please reschedule this appointment.

## 2023-10-27 ENCOUNTER — Encounter: Payer: 59 | Admitting: Internal Medicine

## 2023-11-13 ENCOUNTER — Encounter: Payer: Self-pay | Admitting: Internal Medicine

## 2023-11-13 ENCOUNTER — Ambulatory Visit: Payer: 59 | Admitting: Internal Medicine

## 2023-11-13 VITALS — BP 128/78 | HR 89 | Temp 97.8°F | Resp 16 | Ht 70.0 in | Wt 255.0 lb

## 2023-11-13 DIAGNOSIS — E7800 Pure hypercholesterolemia, unspecified: Secondary | ICD-10-CM | POA: Diagnosis not present

## 2023-11-13 DIAGNOSIS — D649 Anemia, unspecified: Secondary | ICD-10-CM | POA: Diagnosis not present

## 2023-11-13 DIAGNOSIS — E1165 Type 2 diabetes mellitus with hyperglycemia: Secondary | ICD-10-CM | POA: Diagnosis not present

## 2023-11-13 DIAGNOSIS — L989 Disorder of the skin and subcutaneous tissue, unspecified: Secondary | ICD-10-CM

## 2023-11-13 DIAGNOSIS — K219 Gastro-esophageal reflux disease without esophagitis: Secondary | ICD-10-CM

## 2023-11-13 DIAGNOSIS — Z7984 Long term (current) use of oral hypoglycemic drugs: Secondary | ICD-10-CM

## 2023-11-13 DIAGNOSIS — I10 Essential (primary) hypertension: Secondary | ICD-10-CM

## 2023-11-13 DIAGNOSIS — Z0000 Encounter for general adult medical examination without abnormal findings: Secondary | ICD-10-CM | POA: Diagnosis not present

## 2023-11-13 MED ORDER — METFORMIN HCL ER 500 MG PO TB24: 1000.0000 mg | ORAL_TABLET | Freq: Two times a day (BID) | ORAL | 3 refills | Status: DC

## 2023-11-13 NOTE — Assessment & Plan Note (Signed)
On lipitor.  Low cholesterol diet and exercise.  Follow lipid panel and liver function tests.

## 2023-11-13 NOTE — Assessment & Plan Note (Signed)
Low carb diet and exercise. Changed metformin to XR. Loose stools improved.  Doing well on current metformin dosing. Reaction to jardiance. Was started on glipizide 2.5mg  q day. A1c 7.7. Discussed further medication options. He would like to work on diet and exercise, before changing medication.  Discussed starting GLP 1 agonist and benefit of medication. Declines to start.  Wants to hold on making other changes. Diet and exercise. Follow met b and a1c.

## 2023-11-13 NOTE — Assessment & Plan Note (Signed)
No upper symptoms if monitors diet (avoids garlic) and takes PPI.   On omeprazole.

## 2023-11-13 NOTE — Assessment & Plan Note (Addendum)
Physical today 11/13/23.   PSA - 10/21/23 - 2.49. recheck psa in 6 months. Colonoscopy 05/2017.

## 2023-11-13 NOTE — Assessment & Plan Note (Signed)
On losartan/hctz, coreg and amlodipine 10mg  q day.  Follow pressures.  Follow metabolic panel. Blood pressure as outlined.

## 2023-11-13 NOTE — Assessment & Plan Note (Addendum)
Persistent as outlined. Avoid picking.  Follow. Notify me if does not resolve.

## 2023-11-13 NOTE — Progress Notes (Signed)
Subjective:    Patient ID: Aaron Deer., male    DOB: 09-17-1966, 58 y.o.   MRN: [id number]  Patient here for  Chief Complaint  Patient presents with   Annual Exam    HPI Here for a physical exam. On amlodipine 10mg  q day. Continues on carvedilol and losartan/hydrochlorothiazide. On metformin XR. Unable to take jardiance (reaction). On glipizide 2.5mg  q day. Discussed recent labs. A1c 7.7- increased. Discussed medication adjustment and discuss starting GLP 1 agonist. He declines to start medication at this time (or change medication at this time). Discussed diet and exercise.  Drinking soft drinks. Plans to stop. No chest pain or sob reported. No cough or congestion. No abdominal pain or bowel change. Increased stress. Father passed in 08/2023. Also other family stress. Daughter living with him. Overall he feels he is handling things well. Discussed calcium score. Will notify me if agreeable.    Past Medical History:  Diagnosis Date   GERD (gastroesophageal reflux disease)    Hypercholesterolemia    Hyperglycemia    Hypertension    Iron deficiency anemia    Past Surgical History:  Procedure Laterality Date   INGUINAL HERNIA REPAIR     Family History  Problem Relation Age of Onset   Hypertension Mother    Hypertension Father    CVA Paternal Grandfather    Colon cancer Neg Hx    Prostate cancer Neg Hx    Social History   Socioeconomic History   Marital status: Married    Spouse name: Not on file   Number of children: 2   Years of education: Not on file   Highest education level: Not on file  Occupational History   Not on file  Tobacco Use   Smoking status: Never   Smokeless tobacco: Never  Vaping Use   Vaping status: Never Used  Substance and Sexual Activity   Alcohol use: No    Alcohol/week: 0.0 standard drinks of alcohol   Drug use: No   Sexual activity: Not on file  Other Topics Concern   Not on file  Social History Narrative   ** Merged History  Encounter **       Social Drivers of Health   Financial Resource Strain: Low Risk  (11/19/2020)   Overall Financial Resource Strain (CARDIA)    Difficulty of Paying Living Expenses: Not hard at all  Food Insecurity: Not on file  Transportation Needs: Not on file  Physical Activity: Insufficiently Active (08/08/2020)   Exercise Vital Sign    Days of Exercise per Week: 2 days    Minutes of Exercise per Session: 40 min  Stress: Not on file  Social Connections: Not on file     Review of Systems  Constitutional:  Negative for appetite change and unexpected weight change.  HENT:  Negative for congestion, sinus pressure and sore throat.   Eyes:  Negative for pain and visual disturbance.  Respiratory:  Negative for cough, chest tightness and shortness of breath.   Cardiovascular:  Negative for chest pain, palpitations and leg swelling.  Gastrointestinal:  Negative for abdominal pain, diarrhea, nausea and vomiting.  Genitourinary:  Negative for difficulty urinating and dysuria.  Musculoskeletal:  Negative for joint swelling and myalgias.  Skin:  Negative for color change and rash.  Neurological:  Negative for dizziness and headaches.  Hematological:  Negative for adenopathy. Does not bruise/bleed easily.  Psychiatric/Behavioral:  Negative for agitation and dysphoric mood.        Objective:  BP 128/78   Pulse 89   Temp 97.8 F (36.6 C)   Resp 16   Ht 5\' 10"  (1.778 m)   Wt 255 lb (115.7 kg)   SpO2 98%   BMI 36.59 kg/m  Wt Readings from Last 3 Encounters:  11/13/23 255 lb (115.7 kg)  07/16/23 256 lb (116.1 kg)  04/01/23 255 lb (115.7 kg)    Physical Exam Constitutional:      General: He is not in acute distress.    Appearance: Normal appearance. He is well-developed.  HENT:     Head: Normocephalic and atraumatic.     Right Ear: External ear normal.     Left Ear: External ear normal.     Mouth/Throat:     Pharynx: No oropharyngeal exudate or posterior oropharyngeal  erythema.  Eyes:     General: No scleral icterus.       Right eye: No discharge.        Left eye: No discharge.     Conjunctiva/sclera: Conjunctivae normal.  Neck:     Thyroid: No thyromegaly.  Cardiovascular:     Rate and Rhythm: Normal rate and regular rhythm.  Pulmonary:     Effort: No respiratory distress.     Breath sounds: Normal breath sounds. No wheezing.  Abdominal:     General: Bowel sounds are normal.     Palpations: Abdomen is soft.     Tenderness: There is no abdominal tenderness.  Musculoskeletal:        General: No swelling or tenderness.     Cervical back: Neck supple. No tenderness.  Lymphadenopathy:     Cervical: No cervical adenopathy.  Skin:    Findings: No erythema or rash.     Comments: Small open .5cm - open lesion lower abdomen. No surrounding erythema. No pain.   Neurological:     Mental Status: He is alert and oriented to person, place, and time.  Psychiatric:        Mood and Affect: Mood normal.        Behavior: Behavior normal.         Outpatient Encounter Medications as of 11/13/2023  Medication Sig   amLODipine (NORVASC) 10 MG tablet TAKE 1 TABLET DAILY   aspirin 81 MG tablet Take 81 mg by mouth daily.   atorvastatin (LIPITOR) 40 MG tablet TAKE 1 TABLET DAILY   carvedilol (COREG) 12.5 MG tablet TAKE 1 TABLET TWICE A DAY WITH MEALS   glipiZIDE (GLUCOTROL XL) 2.5 MG 24 hr tablet TAKE 1 TABLET DAILY WITH BREAKFAST   losartan-hydrochlorothiazide (HYZAAR) 100-12.5 MG tablet Take 1 tablet by mouth daily.   meloxicam (MOBIC) 15 MG tablet Take 1 tablet (15 mg total) by mouth daily as needed for pain.   mupirocin ointment (BACTROBAN) 2 % Apply 1 Application topically 2 (two) times daily.   omeprazole (PRILOSEC) 40 MG capsule TAKE 1 CAPSULE DAILY   tamsulosin (FLOMAX) 0.4 MG CAPS capsule TAKE 1 CAPSULE DAILY AFTER SUPPER   [DISCONTINUED] metFORMIN (GLUCOPHAGE-XR) 500 MG 24 hr tablet TAKE 2 TABLETS TWICE A DAY   metFORMIN (GLUCOPHAGE-XR) 500 MG 24 hr  tablet Take 2 tablets (1,000 mg total) by mouth 2 (two) times daily.   No facility-administered encounter medications on file as of 11/13/2023.     Lab Results  Component Value Date   WBC 9.3 10/21/2023   HGB 13.9 10/21/2023   HCT 41.2 10/21/2023   PLT 337.0 10/21/2023   GLUCOSE 112 (H) 10/21/2023   CHOL 146 10/21/2023  TRIG 134.0 10/21/2023   HDL 39.20 10/21/2023   LDLCALC 80 10/21/2023   ALT 14 10/21/2023   AST 13 10/21/2023   NA 141 10/21/2023   K 4.0 10/21/2023   CL 102 10/21/2023   CREATININE 1.02 10/21/2023   BUN 16 10/21/2023   CO2 30 10/21/2023   TSH 2.43 07/03/2023   PSA 2.49 10/21/2023   HGBA1C 7.7 (H) 10/21/2023   MICROALBUR <0.7 07/03/2023    No results found.     Assessment & Plan:  Health care maintenance Assessment & Plan: Physical today 11/13/23.   PSA - 10/21/23 - 2.49. recheck psa in 6 months. Colonoscopy 05/2017.     Anemia, unspecified type -     Basic metabolic panel; Future  Hypercholesterolemia Assessment & Plan: On lipitor.  Low cholesterol diet and exercise.  Follow lipid panel and liver function tests.    Orders: -     Lipid panel; Future -     Hepatic function panel; Future  Type 2 diabetes mellitus with hyperglycemia, without long-term current use of insulin (HCC) Assessment & Plan: Low carb diet and exercise. Changed metformin to XR. Loose stools improved.  Doing well on current metformin dosing. Reaction to jardiance. Was started on glipizide 2.5mg  q day. A1c 7.7. Discussed further medication options. He would like to work on diet and exercise, before changing medication.  Discussed starting GLP 1 agonist and benefit of medication. Declines to start.  Wants to hold on making other changes. Diet and exercise. Follow met b and a1c.    Orders: -     Hemoglobin A1c; Future  Gastroesophageal reflux disease, unspecified whether esophagitis present Assessment & Plan: No upper symptoms if monitors diet (avoids garlic) and takes PPI.   On  omeprazole.     Primary hypertension Assessment & Plan: On losartan/hctz, coreg and amlodipine 10mg  q day.  Follow pressures.  Follow metabolic panel. Blood pressure as outlined.    Skin lesion Assessment & Plan: Persistent as outlined. Avoid picking.  Follow. Notify me if does not resolve.    Other orders -     metFORMIN HCl ER; Take 2 tablets (1,000 mg total) by mouth 2 (two) times daily.  Dispense: 360 tablet; Refill: 3     Dale [place], MD

## 2024-01-05 ENCOUNTER — Encounter: Payer: 59 | Admitting: Internal Medicine

## 2024-01-29 ENCOUNTER — Other Ambulatory Visit: Payer: Self-pay | Admitting: Internal Medicine

## 2024-02-19 ENCOUNTER — Other Ambulatory Visit: Payer: 59

## 2024-02-19 ENCOUNTER — Other Ambulatory Visit (INDEPENDENT_AMBULATORY_CARE_PROVIDER_SITE_OTHER)

## 2024-02-19 DIAGNOSIS — E7800 Pure hypercholesterolemia, unspecified: Secondary | ICD-10-CM

## 2024-02-19 DIAGNOSIS — D649 Anemia, unspecified: Secondary | ICD-10-CM | POA: Diagnosis not present

## 2024-02-19 DIAGNOSIS — E1165 Type 2 diabetes mellitus with hyperglycemia: Secondary | ICD-10-CM

## 2024-02-19 LAB — HEPATIC FUNCTION PANEL
ALT: 13 U/L (ref 0–53)
AST: 11 U/L (ref 0–37)
Albumin: 4.1 g/dL (ref 3.5–5.2)
Alkaline Phosphatase: 94 U/L (ref 39–117)
Bilirubin, Direct: 0.2 mg/dL (ref 0.0–0.3)
Total Bilirubin: 0.8 mg/dL (ref 0.2–1.2)
Total Protein: 6.9 g/dL (ref 6.0–8.3)

## 2024-02-19 LAB — LIPID PANEL
Cholesterol: 120 mg/dL (ref 0–200)
HDL: 40.4 mg/dL (ref >39.00)
LDL Cholesterol: 59 mg/dL (ref 0–99)
NonHDL: 79.45
Total CHOL/HDL Ratio: 3
Triglycerides: 102 mg/dL (ref 0.0–149.0)
VLDL: 20.4 mg/dL (ref 0.0–40.0)

## 2024-02-19 LAB — BASIC METABOLIC PANEL WITH GFR
BUN: 17 mg/dL (ref 6–23)
CO2: 30 meq/L (ref 19–32)
Calcium: 8.9 mg/dL (ref 8.4–10.5)
Chloride: 98 meq/L (ref 96–112)
Creatinine, Ser: 0.99 mg/dL (ref 0.40–1.50)
GFR: 83.8 mL/min (ref >60.00)
Glucose, Bld: 99 mg/dL (ref 70–99)
Potassium: 3.5 meq/L (ref 3.5–5.1)
Sodium: 137 meq/L (ref 135–145)

## 2024-02-19 LAB — HEMOGLOBIN A1C: Hgb A1c MFr Bld: 7.3 % — ABNORMAL HIGH (ref 4.6–6.5)

## 2024-02-24 ENCOUNTER — Ambulatory Visit: Payer: Self-pay | Admitting: Internal Medicine

## 2024-02-25 ENCOUNTER — Ambulatory Visit: Payer: 59 | Admitting: Internal Medicine

## 2024-03-10 ENCOUNTER — Ambulatory Visit (INDEPENDENT_AMBULATORY_CARE_PROVIDER_SITE_OTHER): Admitting: Internal Medicine

## 2024-03-10 VITALS — BP 132/74 | HR 73 | Temp 98.0°F | Resp 16 | Ht 70.0 in | Wt 249.8 lb

## 2024-03-10 DIAGNOSIS — D649 Anemia, unspecified: Secondary | ICD-10-CM

## 2024-03-10 DIAGNOSIS — K219 Gastro-esophageal reflux disease without esophagitis: Secondary | ICD-10-CM

## 2024-03-10 DIAGNOSIS — E1165 Type 2 diabetes mellitus with hyperglycemia: Secondary | ICD-10-CM

## 2024-03-10 DIAGNOSIS — E7800 Pure hypercholesterolemia, unspecified: Secondary | ICD-10-CM | POA: Diagnosis not present

## 2024-03-10 DIAGNOSIS — I10 Essential (primary) hypertension: Secondary | ICD-10-CM

## 2024-03-10 DIAGNOSIS — Z7984 Long term (current) use of oral hypoglycemic drugs: Secondary | ICD-10-CM

## 2024-03-10 MED ORDER — CARVEDILOL 12.5 MG PO TABS: 12.5000 mg | ORAL_TABLET | Freq: Two times a day (BID) | ORAL | 3 refills | Status: DC

## 2024-03-10 MED ORDER — AMLODIPINE BESYLATE 10 MG PO TABS: 10.0000 mg | ORAL_TABLET | Freq: Every day | ORAL | 3 refills | Status: DC

## 2024-03-10 NOTE — Progress Notes (Signed)
 Subjective:    Patient ID: Aaron Ochs., male    DOB: [birth year], 58 y.o.   MRN: [id number]  Patient here for  Chief Complaint  Patient presents with   Medical Management of Chronic Issues    HPI Here for a scheduled follow up- f/u regarding diabetes, hypertension and hypercholesterolemia. On amlodipine  10mg  q day. Continues on carvedilol  and losartan /hydrochlorothiazide. On metformin  XR. Unable to take jardiance  (reaction). On glipizide  2.5mg  q day. Discussed recent labs. A1c 7.3- on recent check. Improved. Previously discussed medication adjustment and discuss starting GLP 1 agonist. He comes in today with recent sugar readings. Recently sugars readings elevated - 260-300. He has not changed diet or activity. Stress is better. Has not been sick. Physically no change. Sugar readings do not match with his A1c - decreasing.    Past Medical History:  Diagnosis Date   GERD (gastroesophageal reflux disease)    Hypercholesterolemia    Hyperglycemia    Hypertension    Iron deficiency anemia    Past Surgical History:  Procedure Laterality Date   INGUINAL HERNIA REPAIR     Family History  Problem Relation Age of Onset   Hypertension Mother    Hypertension Father    CVA Paternal Grandfather    Colon cancer Neg Hx    Prostate cancer Neg Hx    Social History   Socioeconomic History   Marital status: Married    Spouse name: Not on file   Number of children: 2   Years of education: Not on file   Highest education level: Not on file  Occupational History   Not on file  Tobacco Use   Smoking status: Never   Smokeless tobacco: Never  Vaping Use   Vaping status: Never Used  Substance and Sexual Activity   Alcohol use: No    Alcohol/week: 0.0 standard drinks of alcohol   Drug use: No   Sexual activity: Not on file  Other Topics Concern   Not on file  Social History Narrative   ** Merged History Encounter **       Social Drivers of Health   Financial Resource Strain:  Low Risk  (11/19/2020)   Overall Financial Resource Strain (CARDIA)    Difficulty of Paying Living Expenses: Not hard at all  Food Insecurity: Not on file  Transportation Needs: Not on file  Physical Activity: Insufficiently Active (08/08/2020)   Exercise Vital Sign    Days of Exercise per Week: 2 days    Minutes of Exercise per Session: 40 min  Stress: Not on file  Social Connections: Not on file     Review of Systems  Constitutional:  Negative for appetite change and unexpected weight change.  HENT:  Negative for congestion and sinus pressure.   Respiratory:  Negative for cough, chest tightness and shortness of breath.   Cardiovascular:  Negative for chest pain, palpitations and leg swelling.  Gastrointestinal:  Negative for abdominal pain, diarrhea, nausea and vomiting.  Genitourinary:  Negative for difficulty urinating and dysuria.  Musculoskeletal:  Negative for joint swelling and myalgias.  Skin:  Negative for color change and rash.  Neurological:  Negative for dizziness and headaches.  Psychiatric/Behavioral:  Negative for agitation and dysphoric mood.        Objective:     BP 132/74   Pulse 73   Temp 98 F (36.7 C)   Resp 16   Ht 5' 10 (1.778 m)   Wt 249 lb 12.8 oz (113.3 kg)  SpO2 98%   BMI 35.84 kg/m  Wt Readings from Last 3 Encounters:  03/10/24 249 lb 12.8 oz (113.3 kg)  11/13/23 255 lb (115.7 kg)  07/16/23 256 lb (116.1 kg)    Physical Exam Vitals reviewed.  Constitutional:      General: He is not in acute distress.    Appearance: Normal appearance. He is well-developed.  HENT:     Head: Normocephalic and atraumatic.     Right Ear: External ear normal.     Left Ear: External ear normal.     Mouth/Throat:     Pharynx: No oropharyngeal exudate or posterior oropharyngeal erythema.   Eyes:     General: No scleral icterus.       Right eye: No discharge.        Left eye: No discharge.     Conjunctiva/sclera: Conjunctivae normal.     Cardiovascular:     Rate and Rhythm: Normal rate and regular rhythm.  Pulmonary:     Effort: Pulmonary effort is normal. No respiratory distress.     Breath sounds: Normal breath sounds.  Abdominal:     General: Bowel sounds are normal.     Palpations: Abdomen is soft.     Tenderness: There is no abdominal tenderness.   Musculoskeletal:        General: No swelling or tenderness.     Cervical back: Neck supple. No tenderness.  Lymphadenopathy:     Cervical: No cervical adenopathy.   Skin:    Findings: No erythema or rash.   Neurological:     Mental Status: He is alert.   Psychiatric:        Mood and Affect: Mood normal.        Behavior: Behavior normal.         Outpatient Encounter Medications as of 03/10/2024  Medication Sig   amLODipine  (NORVASC ) 10 MG tablet Take 1 tablet (10 mg total) by mouth daily.   aspirin 81 MG tablet Take 81 mg by mouth daily.   atorvastatin  (LIPITOR) 40 MG tablet TAKE 1 TABLET DAILY   carvedilol  (COREG ) 12.5 MG tablet Take 1 tablet (12.5 mg total) by mouth 2 (two) times daily with a meal.   glipiZIDE  (GLUCOTROL  XL) 2.5 MG 24 hr tablet TAKE 1 TABLET DAILY WITH BREAKFAST   losartan -hydrochlorothiazide (HYZAAR) 100-12.5 MG tablet Take 1 tablet by mouth daily.   meloxicam  (MOBIC ) 15 MG tablet Take 1 tablet (15 mg total) by mouth daily as needed for pain.   metFORMIN  (GLUCOPHAGE -XR) 500 MG 24 hr tablet TAKE 2 TABLETS TWICE A DAY   mupirocin  ointment (BACTROBAN ) 2 % Apply 1 Application topically 2 (two) times daily.   omeprazole  (PRILOSEC) 40 MG capsule TAKE 1 CAPSULE DAILY   tamsulosin  (FLOMAX ) 0.4 MG CAPS capsule TAKE 1 CAPSULE DAILY AFTER SUPPER   [DISCONTINUED] amLODipine  (NORVASC ) 10 MG tablet TAKE 1 TABLET DAILY   [DISCONTINUED] carvedilol  (COREG ) 12.5 MG tablet TAKE 1 TABLET TWICE A DAY WITH MEALS   No facility-administered encounter medications on file as of 03/10/2024.     Lab Results  Component Value Date   WBC 9.3 10/21/2023    HGB 13.9 10/21/2023   HCT 41.2 10/21/2023   PLT 337.0 10/21/2023   GLUCOSE 99 02/19/2024   CHOL 120 02/19/2024   TRIG 102.0 02/19/2024   HDL 40.40 02/19/2024   LDLCALC 59 02/19/2024   ALT 13 02/19/2024   AST 11 02/19/2024   NA 137 02/19/2024   K 3.5 02/19/2024   CL  98 02/19/2024   CREATININE 0.99 02/19/2024   BUN 17 02/19/2024   CO2 30 02/19/2024   TSH 2.43 07/03/2023   PSA 2.49 10/21/2023   HGBA1C 7.3 (H) 02/19/2024   MICROALBUR <0.7 07/03/2023       Assessment & Plan:  Gastroesophageal reflux disease, unspecified whether esophagitis present Assessment & Plan: No upper symptoms reported. Continue omeprazole .    Anemia, unspecified type Assessment & Plan: Follow cbc.   Orders: -     Basic metabolic panel with GFR; Future  Hypercholesterolemia Assessment & Plan: On lipitor.  Low cholesterol diet and exercise.  Follow lipid panel and liver function tests.  No change in medication today.   Orders: -     Lipid panel; Future -     Hepatic function panel; Future  Type 2 diabetes mellitus with hyperglycemia, without long-term current use of insulin (HCC) Assessment & Plan: Low carb diet and exercise. Changed metformin  to XR. Loose stools improved.  Doing well on current metformin  dosing. Reaction to jardiance . Was started on glipizide  2.5mg  q day. A1c (recent check) - 7.3. . Have discussed further medication options. He had wanted  to work on diet and exercise, before changing medication.  Have discussed starting GLP 1 agonist and benefit of medication. Discussed his recent sugar readings. They do not match recent decreasing A1c. Today's check by our glucometer - 119 - does not match his checks. Will prescribe a new glucometer. Continue same medication. Continue low carb diet and exercise. Send in sugar readings - checking with new glucometer.   Orders: -     Hemoglobin A1c; Future  Primary hypertension Assessment & Plan: On losartan /hctz, coreg  and amlodipine  10mg  q  day.  Follow pressures.  Follow metabolic panel. Blood pressure as outlined. No changes in medication today.    Other orders -     amLODIPine  Besylate; Take 1 tablet (10 mg total) by mouth daily.  Dispense: 90 tablet; Refill: 3 -     Carvedilol ; Take 1 tablet (12.5 mg total) by mouth 2 (two) times daily with a meal.  Dispense: 180 tablet; Refill: 3     Dellar Fenton, MD

## 2024-03-11 ENCOUNTER — Telehealth: Payer: Self-pay

## 2024-03-11 DIAGNOSIS — E1165 Type 2 diabetes mellitus with hyperglycemia: Secondary | ICD-10-CM

## 2024-03-11 MED ORDER — BLOOD GLUCOSE TEST VI STRP: ORAL_STRIP | 5 refills | Status: AC

## 2024-03-11 MED ORDER — LANCETS MISC. MISC: 5 refills | Status: AC

## 2024-03-11 MED ORDER — LANCET DEVICE MISC
0 refills | Status: AC
Start: 2024-03-11 — End: ?

## 2024-03-11 MED ORDER — BLOOD GLUCOSE MONITORING SUPPL DEVI
0 refills | Status: AC
Start: 2024-03-11 — End: ?

## 2024-03-11 NOTE — Telephone Encounter (Signed)
 Copied from CRM [id number]. Topic: Clinical - Prescription Issue >> Mar 10, 2024  3:23 PM Marlan Silva wrote: Reason for CRM: Patients wife therese Delcastillo called on behalf on the patient and stated that the pharmacy [facility] contacted them and said that the patients medications were not processed. Patient would like a call back to [phone]. Patient would like to know what medications were sent and where exactly.

## 2024-03-11 NOTE — Telephone Encounter (Signed)
 Amlodipine  and carvedilol  was sent to mail order. Glucometer sent to local. Pt is aware.

## 2024-03-11 NOTE — Addendum Note (Signed)
 Addended by: Victorino Grates D on: 03/11/2024 09:14 AM   Modules accepted: Orders

## 2024-03-13 ENCOUNTER — Encounter: Payer: Self-pay | Admitting: Internal Medicine

## 2024-03-13 NOTE — Assessment & Plan Note (Signed)
 On lipitor.  Low cholesterol diet and exercise.  Follow lipid panel and liver function tests.  No change in medication today.

## 2024-03-13 NOTE — Assessment & Plan Note (Signed)
 On losartan /hctz, coreg  and amlodipine  10mg  q day.  Follow pressures.  Follow metabolic panel. Blood pressure as outlined. No changes in medication today.

## 2024-03-13 NOTE — Assessment & Plan Note (Signed)
 Follow cbc.

## 2024-03-13 NOTE — Assessment & Plan Note (Signed)
 Low carb diet and exercise. Changed metformin  to XR. Loose stools improved.  Doing well on current metformin  dosing. Reaction to jardiance . Was started on glipizide  2.5mg  q day. A1c (recent check) - 7.3. . Have discussed further medication options. He had wanted  to work on diet and exercise, before changing medication.  Have discussed starting GLP 1 agonist and benefit of medication. Discussed his recent sugar readings. They do not match recent decreasing A1c. Today's check by our glucometer - 119 - does not match his checks. Will prescribe a new glucometer. Continue same medication. Continue low carb diet and exercise. Send in sugar readings - checking with new glucometer.

## 2024-03-13 NOTE — Assessment & Plan Note (Signed)
No upper symptoms reported.  Continue omeprazole.

## 2024-04-26 ENCOUNTER — Ambulatory Visit (INDEPENDENT_AMBULATORY_CARE_PROVIDER_SITE_OTHER): Admitting: Internal Medicine

## 2024-04-26 ENCOUNTER — Encounter: Payer: Self-pay | Admitting: Internal Medicine

## 2024-04-26 VITALS — BP 126/72 | HR 78 | Resp 16 | Ht 70.0 in | Wt 251.0 lb

## 2024-04-26 DIAGNOSIS — K219 Gastro-esophageal reflux disease without esophagitis: Secondary | ICD-10-CM

## 2024-04-26 DIAGNOSIS — E7800 Pure hypercholesterolemia, unspecified: Secondary | ICD-10-CM

## 2024-04-26 DIAGNOSIS — G4733 Obstructive sleep apnea (adult) (pediatric): Secondary | ICD-10-CM | POA: Diagnosis not present

## 2024-04-26 DIAGNOSIS — I10 Essential (primary) hypertension: Secondary | ICD-10-CM

## 2024-04-26 DIAGNOSIS — E1165 Type 2 diabetes mellitus with hyperglycemia: Secondary | ICD-10-CM

## 2024-04-26 NOTE — Progress Notes (Unsigned)
 Subjective:    Patient ID: Aaron Allison., male    DOB: [birth year], 58 y.o.   MRN: [id number]  Patient here for  Chief Complaint  Patient presents with   Medical Management of Chronic Issues    Follow up on blood sugars    HPI Here for a scheduled follow up -  f/u regarding diabetes, hypertension and hypercholesterolemia. On amlodipine  10mg  q day. Continues on carvedilol  and losartan /hydrochlorothiazide. On metformin  XR. Unable to take jardiance  (reaction). On glipizide  2.5mg  q day. Discussed recent labs. A1c 7.3- on recent check. Improved. Previously discussed medication adjustment and discuss starting GLP 1 agonist. Last visit, his glucometer was reading high. Did not correlate with our check in office. Was prescribed new glucometer. Comes in today to follow up regarding his sugars. Reviewed outside checks - blood sugars 102-114 in am and pm sugars 120-145. Overall doing well. Feels good. Blood pressures mostly averaging 120-130s/70-s. No chest pain or sob reported. No abdominal pain or bowel change reported. No acid reflux.    Past Medical History:  Diagnosis Date   GERD (gastroesophageal reflux disease)    Hypercholesterolemia    Hyperglycemia    Hypertension    Iron deficiency anemia    Past Surgical History:  Procedure Laterality Date   INGUINAL HERNIA REPAIR     Family History  Problem Relation Age of Onset   Hypertension Mother    Hypertension Father    CVA Paternal Grandfather    Colon cancer Neg Hx    Prostate cancer Neg Hx    Social History   Socioeconomic History   Marital status: Married    Spouse name: Not on file   Number of children: 2   Years of education: Not on file   Highest education level: Not on file  Occupational History   Not on file  Tobacco Use   Smoking status: Never   Smokeless tobacco: Never  Vaping Use   Vaping status: Never Used  Substance and Sexual Activity   Alcohol use: No    Alcohol/week: 0.0 standard drinks of alcohol    Drug use: No   Sexual activity: Not on file  Other Topics Concern   Not on file  Social History Narrative   ** Merged History Encounter **       Social Drivers of Health   Financial Resource Strain: Low Risk  (11/19/2020)   Overall Financial Resource Strain (CARDIA)    Difficulty of Paying Living Expenses: Not hard at all  Food Insecurity: Not on file  Transportation Needs: Not on file  Physical Activity: Insufficiently Active (08/08/2020)   Exercise Vital Sign    Days of Exercise per Week: 2 days    Minutes of Exercise per Session: 40 min  Stress: Not on file  Social Connections: Not on file     Review of Systems  Constitutional:  Negative for appetite change and unexpected weight change.  HENT:  Negative for congestion and sinus pressure.   Respiratory:  Negative for cough, chest tightness and shortness of breath.   Cardiovascular:  Negative for chest pain, palpitations and leg swelling.  Gastrointestinal:  Negative for abdominal pain, diarrhea, nausea and vomiting.  Genitourinary:  Negative for difficulty urinating and dysuria.  Musculoskeletal:  Negative for joint swelling and myalgias.  Skin:  Negative for color change and rash.  Neurological:  Negative for dizziness and headaches.  Psychiatric/Behavioral:  Negative for agitation and dysphoric mood.        Objective:  BP 126/72   Pulse 78   Resp 16   Ht 5' 10 (1.778 m)   Wt 251 lb (113.9 kg)   SpO2 98%   BMI 36.01 kg/m  Wt Readings from Last 3 Encounters:  04/26/24 251 lb (113.9 kg)  03/10/24 249 lb 12.8 oz (113.3 kg)  11/13/23 255 lb (115.7 kg)    Physical Exam Vitals reviewed.  Constitutional:      General: He is not in acute distress.    Appearance: Normal appearance. He is well-developed.  HENT:     Head: Normocephalic and atraumatic.     Right Ear: External ear normal.     Left Ear: External ear normal.     Mouth/Throat:     Pharynx: No oropharyngeal exudate or posterior oropharyngeal  erythema.  Eyes:     General: No scleral icterus.       Right eye: No discharge.        Left eye: No discharge.     Conjunctiva/sclera: Conjunctivae normal.  Cardiovascular:     Rate and Rhythm: Normal rate and regular rhythm.  Pulmonary:     Effort: Pulmonary effort is normal. No respiratory distress.     Breath sounds: Normal breath sounds.  Abdominal:     General: Bowel sounds are normal.     Palpations: Abdomen is soft.     Tenderness: There is no abdominal tenderness.  Musculoskeletal:        General: No swelling or tenderness.     Cervical back: Neck supple. No tenderness.  Lymphadenopathy:     Cervical: No cervical adenopathy.  Skin:    Findings: No erythema or rash.  Neurological:     Mental Status: He is alert.  Psychiatric:        Mood and Affect: Mood normal.        Behavior: Behavior normal.         Outpatient Encounter Medications as of 04/26/2024  Medication Sig   amLODipine  (NORVASC ) 10 MG tablet Take 1 tablet (10 mg total) by mouth daily.   aspirin 81 MG tablet Take 81 mg by mouth daily.   atorvastatin  (LIPITOR) 40 MG tablet TAKE 1 TABLET DAILY   Blood Glucose Monitoring Suppl DEVI Use to check blood sugar bid   carvedilol  (COREG ) 12.5 MG tablet Take 1 tablet (12.5 mg total) by mouth 2 (two) times daily with a meal.   glipiZIDE  (GLUCOTROL  XL) 2.5 MG 24 hr tablet TAKE 1 TABLET DAILY WITH BREAKFAST   Glucose Blood (BLOOD GLUCOSE TEST STRIPS) STRP Use to check sugars bid   Lancet Device MISC Use to check blood sugar bid   Lancets Misc. MISC Use to check blood sugars bid   losartan -hydrochlorothiazide (HYZAAR) 100-12.5 MG tablet Take 1 tablet by mouth daily.   meloxicam  (MOBIC ) 15 MG tablet Take 1 tablet (15 mg total) by mouth daily as needed for pain.   metFORMIN  (GLUCOPHAGE -XR) 500 MG 24 hr tablet TAKE 2 TABLETS TWICE A DAY   mupirocin  ointment (BACTROBAN ) 2 % Apply 1 Application topically 2 (two) times daily.   omeprazole  (PRILOSEC) 40 MG capsule TAKE 1  CAPSULE DAILY   tamsulosin  (FLOMAX ) 0.4 MG CAPS capsule TAKE 1 CAPSULE DAILY AFTER SUPPER   No facility-administered encounter medications on file as of 04/26/2024.     Lab Results  Component Value Date   WBC 9.3 10/21/2023   HGB 13.9 10/21/2023   HCT 41.2 10/21/2023   PLT 337.0 10/21/2023   GLUCOSE 99 02/19/2024   CHOL  120 02/19/2024   TRIG 102.0 02/19/2024   HDL 40.40 02/19/2024   LDLCALC 59 02/19/2024   ALT 13 02/19/2024   AST 11 02/19/2024   NA 137 02/19/2024   K 3.5 02/19/2024   CL 98 02/19/2024   CREATININE 0.99 02/19/2024   BUN 17 02/19/2024   CO2 30 02/19/2024   TSH 2.43 07/03/2023   PSA 2.49 10/21/2023   HGBA1C 7.3 (H) 02/19/2024       Assessment & Plan:  Type 2 diabetes mellitus with hyperglycemia, without long-term current use of insulin (HCC) Assessment & Plan: Blood sugars as outlined. Doing better. Discussed diet and exercise. Did discuss again regarding starting GLP 1 agonist.  Discussed weight loss. Wants to hold on starting. Continue current medication regimen. Continue to follow sugars. Follow met b and A1c.   Orders: -     Microalbumin / creatinine urine ratio; Future  Gastroesophageal reflux disease, unspecified whether esophagitis present Assessment & Plan: No upper symptoms reported. Continue omeprazole .    Mild obstructive sleep apnea Assessment & Plan: Very mild OSA on previous HST with AHI 5.2. saw pulmonary and they discussed treatment options - including oral appliance, CPAP therapy. Opted for side sleeping position. Discussed diet and exercise today. Discussed weight loss and GLP 1 agonist. Follow.    Primary hypertension Assessment & Plan: On losartan /hctz, coreg  and amlodipine  10mg  q day.  Follow pressures.  Follow metabolic panel. Blood pressures as outlined. No changes in medication today.    Hypercholesterolemia Assessment & Plan: On lipitor.  Low cholesterol diet and exercise.  Follow lipid panel and liver function tests.  No  changes in medication toda.       Allena Hamilton, MD

## 2024-04-27 ENCOUNTER — Ambulatory Visit: Payer: Self-pay

## 2024-04-27 NOTE — Telephone Encounter (Signed)
 Seeing you at 4

## 2024-04-27 NOTE — Telephone Encounter (Signed)
 FYI Only or Action Required?: Action required by provider: request for appointment.  Patient was last seen in primary care on 04/26/2024 by Glendia Shad, MD.  Called Nurse Triage reporting Tick Removal.  Symptoms began several weeks ago.  Interventions attempted: Nothing.  Symptoms are: gradually worsening.Pt. Pulled a tick off 3 weeks ago and now has a rash to left hip the size of a baseball. No itching or pain.  Triage Disposition: See PCP When Office is Open (Within 3 Days)  Patient/caregiver understands and will follow disposition?: Yes   Reason for Disposition  [1] After 14 days AND [2] insect bite isn't healed  Answer Assessment - Initial Assessment Questions 1. TYPE of INSECT: What type of insect was it?      tick 2. ONSET: When did you get bitten?      13 days ago 3. LOCATION: Where is the insect bite located?      Left hip at waist 4. REDNESS: Is the area red or pink? If Yes, ask: What size is the area of redness? (inches or cm). When did the redness start?     Red rash, raised 5. PAIN: Is there any pain? If Yes, ask: How bad is the pain? (Scale 0-10; or none, mild, moderate, severe)     no 6. ITCHING: Does it itch? If Yes, ask: How bad is the itch?      no 7. SWELLING: How big is the swelling? (e.g., inches, cm, or compare to coins)     no 8. OTHER SYMPTOMS: Do you have any other symptoms?  (e.g., difficulty breathing, fever, hives)     no 9. PREGNANCY: Is there any chance you are pregnant? When was your last menstrual period?     N/a  Protocols used: Insect Bite-A-AH

## 2024-04-27 NOTE — Telephone Encounter (Signed)
 done

## 2024-04-27 NOTE — Telephone Encounter (Signed)
 Called and spoke with patient's wife. Patient wife states she recognized the area when the patient removed his belt. Patient wife says the area does not itch nor has it spread. She says they have tried Calamine lotion and it seems to have went in and gotten better. She says she wanted to make sure that whatever the area of concern is, that it has not gotten into the patient's blood stream.

## 2024-04-27 NOTE — Telephone Encounter (Signed)
 Attempted contact x 1 to discuss symptoms, LVM, will attempt contact at a later time.                         Message from Avram MATSU sent at 04/27/2024  8:30 AM EDT  Pt had got bit by a tick a few weeks ago. He discovered a rash on left thigh no other symptoms [phone] (M)   Answer Assessment - Initial Assessment Questions 1. ATTACHED:  Is the tick still on the skin?  (e.g., yes, no, unsure)       2. ONSET - TICK STILL ATTACHED:  How long do you think the tick has been on your skin? (e.g., hours, days, unsure)  Note:  Is there a recent activity (camping, hiking) where the caller may have been exposed?       3. ONSET - TICK NOT STILL ATTACHED: If the tick has been removed, how long do you think the tick was attached before you removed it? (e.g., 5 hours, 2 days). When was this?       4. LOCATION: Where is the tick bite located? (e.g., arm, leg)       5. TYPE of TICK: Is it a wood tick or a deer tick? (e.g., deer tick, wood tick; unsure)       6. SIZE of TICK: How big is the tick? (e.g., size of poppy seed, apple seed, watermelon seed; unsure) Note: Deer ticks can be the size of a poppy seed (nymph) or an apple seed (adult).         7. ENGORGED: Did the tick look flat or engorged (full, swollen)? (e.g., flat, engorged; unsure)       8. OTHER SYMPTOMS: Do you have any other symptoms? (e.g., fever, rash, redness at bite area, red ring around bite)  Protocols used: Tick Bite-A-AH

## 2024-04-27 NOTE — Telephone Encounter (Signed)
 Noted. Per review of messages, has appt tomorrow for evaluation. Confirm no other symptoms - fever, headache, or any other acute issues.

## 2024-04-28 ENCOUNTER — Ambulatory Visit (INDEPENDENT_AMBULATORY_CARE_PROVIDER_SITE_OTHER)

## 2024-04-28 ENCOUNTER — Encounter: Payer: Self-pay | Admitting: Internal Medicine

## 2024-04-28 VITALS — BP 110/80 | HR 78 | Temp 98.6°F | Ht 70.0 in | Wt 250.0 lb

## 2024-04-28 DIAGNOSIS — A6920 Lyme disease, unspecified: Secondary | ICD-10-CM | POA: Insufficient documentation

## 2024-04-28 MED ORDER — DOXYCYCLINE HYCLATE 100 MG PO TABS: 100.0000 mg | ORAL_TABLET | Freq: Two times a day (BID) | ORAL | 0 refills | Status: AC

## 2024-04-28 NOTE — Assessment & Plan Note (Signed)
 On losartan /hctz, coreg  and amlodipine  10mg  q day.  Follow pressures.  Follow metabolic panel. Blood pressures as outlined. No changes in medication today.

## 2024-04-28 NOTE — Progress Notes (Signed)
   Acute Office Visit  Subjective:    Patient ID: Aaron Allison., male    DOB: 19-Dec-1965, 58 y.o.   MRN: [id number]  Chief Complaint  Patient presents with   Tick Removal   Rash   HPI:  Tick bite:  Tick bite: on left lateral lumbar quadrant about 2 weeks ago. Patient reports his wife used a tweezers to remove the tick, was small in size. Duration of attachment unsure. 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 Wife noticed rash on the bite site about 2 days ago, red with central punctate rash where tick was attached. He has been using calamine lotion on the rash which has helped with the rash. Patient denies fatigue, fever, joint pain, chest pain, palpitations, diarrhea, constipation. No history of lyme disease.    Review of Systems  Constitutional:  Negative for chills, fever and malaise/fatigue.  Cardiovascular:  Negative for palpitations and orthopnea.  Gastrointestinal:  Negative for abdominal pain.  Musculoskeletal:  Negative for joint pain and myalgias.  Skin:  Positive for rash.  Neurological:  Negative for weakness.        Objective:    BP 110/80 (BP Location: Right Arm, Patient Position: Sitting, Cuff Size: Normal)   Pulse 78   Temp 98.6 F (37 C) (Oral)   Ht 5' 10 (1.778 m)   Wt 250 lb (113.4 kg)   SpO2 97%   BMI 35.87 kg/m    Physical Exam Constitutional:      General: He is not in acute distress.    Appearance: He is obese.  Cardiovascular:     Rate and Rhythm: Normal rate.  Pulmonary:     Effort: Pulmonary effort is normal.     Breath sounds: Normal breath sounds.  Abdominal:     Tenderness: There is no abdominal tenderness. There is no guarding.  Skin:    General: Skin is warm.     Comments: Left lateral quadrant with erythematous lesion measuring about 6 cm in diameter and 4 cm in width with center tick bite site.   Neurological:     Mental Status: He is alert and oriented to person, place, and time.     Gait: Gait  normal.     No results found for any visits on 04/28/24.     Assessment & Plan:  Erythema migrans (Lyme disease) Assessment & Plan: Lesion suggestive of Erythema migrans on exam in the context of recent tick bite. Recommend 10 days course of Doxycycline  100 mg BID. Antibiotic s/d including diarrhea, pill esophagitis, sun burn discussed with the patient. Take daily probiotic to help reduce s/e. Recommend antibiotic adherence. If systemic signs (fever, malaise, arthralgias, new rashes, neurologic symptoms) prompt evaluation recommended.   Orders: -     Doxycycline  Hyclate; Take 1 tablet (100 mg total) by mouth 2 (two) times daily for 10 days.  Dispense: 20 tablet; Refill: 0    Return if symptoms worsen or fail to improve.  Luke Shade, MD

## 2024-04-28 NOTE — Assessment & Plan Note (Signed)
 Very mild OSA on previous HST with AHI 5.2. saw pulmonary and they discussed treatment options - including oral appliance, CPAP therapy. Opted for side sleeping position. Discussed diet and exercise today. Discussed weight loss and GLP 1 agonist. Follow.

## 2024-04-28 NOTE — Assessment & Plan Note (Signed)
No upper symptoms reported.  Continue omeprazole.

## 2024-04-28 NOTE — Assessment & Plan Note (Signed)
 Blood sugars as outlined. Doing better. Discussed diet and exercise. Did discuss again regarding starting GLP 1 agonist.  Discussed weight loss. Wants to hold on starting. Continue current medication regimen. Continue to follow sugars. Follow met b and A1c.

## 2024-04-28 NOTE — Assessment & Plan Note (Signed)
 Lesion suggestive of Erythema migrans on exam in the context of recent tick bite. Recommend 10 days course of Doxycycline  100 mg BID. Antibiotic s/d including diarrhea, pill esophagitis, sun burn discussed with the patient. Take daily probiotic to help reduce s/e. Recommend antibiotic adherence. If systemic signs (fever, malaise, arthralgias, new rashes, neurologic symptoms) prompt evaluation recommended.

## 2024-04-28 NOTE — Assessment & Plan Note (Signed)
 On lipitor.  Low cholesterol diet and exercise.  Follow lipid panel and liver function tests.  No changes in medication toda.

## 2024-05-30 ENCOUNTER — Other Ambulatory Visit: Payer: Self-pay | Admitting: Internal Medicine

## 2024-06-20 ENCOUNTER — Other Ambulatory Visit: Payer: Self-pay | Admitting: Internal Medicine

## 2024-06-23 ENCOUNTER — Other Ambulatory Visit (INDEPENDENT_AMBULATORY_CARE_PROVIDER_SITE_OTHER)

## 2024-06-23 DIAGNOSIS — E1165 Type 2 diabetes mellitus with hyperglycemia: Secondary | ICD-10-CM

## 2024-06-23 DIAGNOSIS — E7800 Pure hypercholesterolemia, unspecified: Secondary | ICD-10-CM | POA: Diagnosis not present

## 2024-06-23 DIAGNOSIS — D649 Anemia, unspecified: Secondary | ICD-10-CM | POA: Diagnosis not present

## 2024-06-23 LAB — LIPID PANEL
Cholesterol: 130 mg/dL (ref 0–200)
HDL: 40.2 mg/dL (ref >39.00)
LDL Cholesterol: 73 mg/dL (ref 0–99)
NonHDL: 90.26
Total CHOL/HDL Ratio: 3
Triglycerides: 86 mg/dL (ref 0.0–149.0)
VLDL: 17.2 mg/dL (ref 0.0–40.0)

## 2024-06-23 LAB — MICROALBUMIN / CREATININE URINE RATIO
Creatinine,U: 151.5 mg/dL
Microalb Creat Ratio: UNDETERMINED mg/g (ref 0.0–30.0)
Microalb, Ur: <0.7 mg/dL

## 2024-06-23 LAB — BASIC METABOLIC PANEL WITH GFR
BUN: 11 mg/dL (ref 6–23)
CO2: 31 meq/L (ref 19–32)
Calcium: 9.2 mg/dL (ref 8.4–10.5)
Chloride: 99 meq/L (ref 96–112)
Creatinine, Ser: 1.08 mg/dL (ref 0.40–1.50)
GFR: 75.31 mL/min (ref >60.00)
Glucose, Bld: 106 mg/dL — ABNORMAL HIGH (ref 70–99)
Potassium: 3.7 meq/L (ref 3.5–5.1)
Sodium: 138 meq/L (ref 135–145)

## 2024-06-23 LAB — HEPATIC FUNCTION PANEL
ALT: 16 U/L (ref 0–53)
AST: 14 U/L (ref 0–37)
Albumin: 4 g/dL (ref 3.5–5.2)
Alkaline Phosphatase: 77 U/L (ref 39–117)
Bilirubin, Direct: 0.1 mg/dL (ref 0.0–0.3)
Total Bilirubin: 0.7 mg/dL (ref 0.2–1.2)
Total Protein: 6.8 g/dL (ref 6.0–8.3)

## 2024-06-23 LAB — HEMOGLOBIN A1C: Hgb A1c MFr Bld: 7.9 % — ABNORMAL HIGH (ref 4.6–6.5)

## 2024-06-27 ENCOUNTER — Ambulatory Visit: Admitting: Internal Medicine

## 2024-06-27 ENCOUNTER — Ambulatory Visit: Payer: Self-pay | Admitting: Internal Medicine

## 2024-07-14 ENCOUNTER — Ambulatory Visit: Admitting: Internal Medicine

## 2024-07-14 ENCOUNTER — Encounter: Payer: Self-pay | Admitting: Internal Medicine

## 2024-07-14 VITALS — BP 130/70 | HR 73 | Temp 97.8°F | Ht 70.0 in | Wt 252.2 lb

## 2024-07-14 DIAGNOSIS — I10 Essential (primary) hypertension: Secondary | ICD-10-CM

## 2024-07-14 DIAGNOSIS — E7800 Pure hypercholesterolemia, unspecified: Secondary | ICD-10-CM

## 2024-07-14 DIAGNOSIS — D649 Anemia, unspecified: Secondary | ICD-10-CM

## 2024-07-14 DIAGNOSIS — G4733 Obstructive sleep apnea (adult) (pediatric): Secondary | ICD-10-CM | POA: Diagnosis not present

## 2024-07-14 DIAGNOSIS — Z7985 Long-term (current) use of injectable non-insulin antidiabetic drugs: Secondary | ICD-10-CM

## 2024-07-14 DIAGNOSIS — K219 Gastro-esophageal reflux disease without esophagitis: Secondary | ICD-10-CM

## 2024-07-14 DIAGNOSIS — E1165 Type 2 diabetes mellitus with hyperglycemia: Secondary | ICD-10-CM

## 2024-07-14 LAB — HM DIABETES FOOT EXAM

## 2024-07-14 MED ORDER — SEMAGLUTIDE(0.25 OR 0.5MG/DOS) 2 MG/3ML ~~LOC~~ SOPN: 0.2500 mg | PEN_INJECTOR | SUBCUTANEOUS | 3 refills | Status: DC

## 2024-07-14 NOTE — Progress Notes (Signed)
 Subjective:    Patient ID: Aaron Allison., male    DOB: [birth year], 58 y.o.   MRN: [id number]  Patient here for  Chief Complaint  Patient presents with   Medical Management of Chronic Issues    9 wk f/u-elevated sugars (brought in readings)    HPI Here for a scheduled follow up - f/u regarding diabetes, hypertension and hypercholesterolemia. On amlodipine  10mg  q day. Continues on carvedilol  and losartan /hydrochlorothiazide. On metformin  XR. Unable to take jardiance  (reaction). On glipizide . Discussed recent labs.  A1c 7.9. reviewed recent sugar readings. Most am sugars 90-130s. PM sugars varying - 150-210. Discussed diet and exercise. Discussed other treatment options, including GLP 1 agonist. Reviewed blood pressures - varying - 120-140/70-80s. No chest pain or sob reported. Has decreased soft drinks. If eats spicy foods - some increased belching. Overall upper symptoms controlled. Left thumb - bee sting sensation. No weakness. Discussed further w/up. Wants to monitor.    Past Medical History:  Diagnosis Date   GERD (gastroesophageal reflux disease)    Hypercholesterolemia    Hyperglycemia    Hypertension    Iron deficiency anemia    Past Surgical History:  Procedure Laterality Date   INGUINAL HERNIA REPAIR     Family History  Problem Relation Age of Onset   Hypertension Mother    Hypertension Father    CVA Paternal Grandfather    Colon cancer Neg Hx    Prostate cancer Neg Hx    Social History   Socioeconomic History   Marital status: Married    Spouse name: Not on file   Number of children: 2   Years of education: Not on file   Highest education level: Not on file  Occupational History   Not on file  Tobacco Use   Smoking status: Never   Smokeless tobacco: Never  Vaping Use   Vaping status: Never Used  Substance and Sexual Activity   Alcohol use: No    Alcohol/week: 0.0 standard drinks of alcohol   Drug use: No   Sexual activity: Not on file  Other Topics  Concern   Not on file  Social History Narrative   ** Merged History Encounter **       Social Drivers of Health   Financial Resource Strain: Low Risk  (11/19/2020)   Overall Financial Resource Strain (CARDIA)    Difficulty of Paying Living Expenses: Not hard at all  Food Insecurity: Not on file  Transportation Needs: Not on file  Physical Activity: Insufficiently Active (08/08/2020)   Exercise Vital Sign    Days of Exercise per Week: 2 days    Minutes of Exercise per Session: 40 min  Stress: Not on file  Social Connections: Not on file     Review of Systems  Constitutional:  Negative for appetite change and unexpected weight change.  HENT:  Negative for congestion and sinus pressure.   Respiratory:  Negative for cough, chest tightness and shortness of breath.   Cardiovascular:  Negative for chest pain, palpitations and leg swelling.  Gastrointestinal:  Negative for abdominal pain, diarrhea, nausea and vomiting.  Genitourinary:  Negative for difficulty urinating and dysuria.  Musculoskeletal:  Negative for joint swelling and myalgias.  Skin:  Negative for color change and rash.  Neurological:  Negative for dizziness and headaches.  Psychiatric/Behavioral:  Negative for agitation and dysphoric mood.        Objective:     BP 130/70 (Cuff Size: Large)   Pulse 73   Temp  97.8 F (36.6 C) (Oral)   Ht 5' 10 (1.778 m)   Wt 252 lb 4 oz (114.4 kg)   SpO2 97%   BMI 36.19 kg/m  Wt Readings from Last 3 Encounters:  07/14/24 252 lb 4 oz (114.4 kg)  04/28/24 250 lb (113.4 kg)  04/26/24 251 lb (113.9 kg)    Physical Exam Constitutional:      General: He is not in acute distress.    Appearance: Normal appearance. He is well-developed.  HENT:     Head: Normocephalic and atraumatic.     Right Ear: External ear normal.     Left Ear: External ear normal.     Mouth/Throat:     Pharynx: No oropharyngeal exudate or posterior oropharyngeal erythema.  Eyes:     General: No  scleral icterus.       Right eye: No discharge.        Left eye: No discharge.  Cardiovascular:     Rate and Rhythm: Normal rate and regular rhythm.  Pulmonary:     Effort: Pulmonary effort is normal. No respiratory distress.     Breath sounds: Normal breath sounds.  Abdominal:     General: Bowel sounds are normal.     Palpations: Abdomen is soft.     Tenderness: There is no abdominal tenderness.  Musculoskeletal:        General: No swelling or tenderness.     Cervical back: Neck supple. No tenderness.  Lymphadenopathy:     Cervical: No cervical adenopathy.  Skin:    Findings: No erythema or rash.  Neurological:     Mental Status: He is alert.  Psychiatric:        Mood and Affect: Mood normal.        Behavior: Behavior normal.      Diabetic foot exam was performed with the following findings:   No deformities, ulcerations, or other skin breakdown Normal sensation of 10g monofilament Intact posterior tibialis and dorsalis pedis pulses      Outpatient Encounter Medications as of 07/14/2024  Medication Sig   amLODipine  (NORVASC ) 10 MG tablet TAKE 1 TABLET DAILY   aspirin 81 MG tablet Take 81 mg by mouth daily.   atorvastatin  (LIPITOR) 40 MG tablet TAKE 1 TABLET DAILY   Blood Glucose Monitoring Suppl DEVI Use to check blood sugar bid   carvedilol  (COREG ) 12.5 MG tablet Take 1 tablet (12.5 mg total) by mouth 2 (two) times daily with a meal.   Glucose Blood (BLOOD GLUCOSE TEST STRIPS) STRP Use to check sugars bid   Lancet Device MISC Use to check blood sugar bid   Lancets Misc. MISC Use to check blood sugars bid   losartan -hydrochlorothiazide (HYZAAR) 100-12.5 MG tablet Take 1 tablet by mouth daily.   meloxicam  (MOBIC ) 15 MG tablet Take 1 tablet (15 mg total) by mouth daily as needed for pain.   metFORMIN  (GLUCOPHAGE -XR) 500 MG 24 hr tablet TAKE 2 TABLETS TWICE A DAY   mupirocin  ointment (BACTROBAN ) 2 % Apply 1 Application topically 2 (two) times daily.   omeprazole   (PRILOSEC) 40 MG capsule TAKE 1 CAPSULE DAILY   Semaglutide,0.25 or 0.5MG /DOS, 2 MG/3ML SOPN Inject 0.25 mg into the skin once a week.   tamsulosin  (FLOMAX ) 0.4 MG CAPS capsule TAKE 1 CAPSULE DAILY AFTER SUPPER   [DISCONTINUED] glipiZIDE  (GLUCOTROL  XL) 2.5 MG 24 hr tablet TAKE 1 TABLET DAILY WITH BREAKFAST   No facility-administered encounter medications on file as of 07/14/2024.     Lab Results  Component Value Date   WBC 9.3 10/21/2023   HGB 13.9 10/21/2023   HCT 41.2 10/21/2023   PLT 337.0 10/21/2023   GLUCOSE 106 (H) 06/23/2024   CHOL 130 06/23/2024   TRIG 86.0 06/23/2024   HDL 40.20 06/23/2024   LDLCALC 73 06/23/2024   ALT 16 06/23/2024   AST 14 06/23/2024   NA 138 06/23/2024   K 3.7 06/23/2024   CL 99 06/23/2024   CREATININE 1.08 06/23/2024   BUN 11 06/23/2024   CO2 31 06/23/2024   TSH 2.43 07/03/2023   PSA 2.49 10/21/2023   HGBA1C 7.9 (H) 06/23/2024   MICROALBUR <0.7 06/23/2024       Assessment & Plan:  Mild obstructive sleep apnea Assessment & Plan: Very mild OSA on previous HST with AHI 5.2. saw pulmonary and they discussed treatment options - including oral appliance, CPAP therapy. Opted for side sleeping position. Discussed diet and exercise today. Discussed weight loss and GLP 1 agonist. Agreeable to start ozempic.    Anemia, unspecified type Assessment & Plan: Check cbc next labs.   Orders: -     Basic metabolic panel with GFR; Future -     CBC with Differential/Platelet; Future  Hypercholesterolemia Assessment & Plan: On lipitor.  Low cholesterol diet and exercise.  Follow lipid panel and liver function tests.  No change in medication today.   Orders: -     Lipid panel; Future -     Hepatic function panel; Future  Type 2 diabetes mellitus with hyperglycemia, without long-term current use of insulin (HCC) Assessment & Plan: Blood sugars as outlined. A1c elevated last check 7.9. continue diet and exercise. Has decreased soft drinks. Discussed  other treatment options. Will start ozempic .25mg  q day. Discussed possible side effects of medication. Stay hydrated. Adequate protein intake. Follow.   Orders: -     Hemoglobin A1c; Future  Primary hypertension Assessment & Plan: On losartan /hctz, coreg  and amlodipine  10mg  q day.  Follow pressures.  Follow metabolic panel. Blood pressures as outlined. Recheck by me 128/78. No changes in medication today.    Gastroesophageal reflux disease, unspecified whether esophagitis present Assessment & Plan: If watches diet and takes PPI, upper symptoms controlled. Follow.    Other orders -     Semaglutide(0.25 or 0.5MG /DOS); Inject 0.25 mg into the skin once a week.  Dispense: 3 mL; Refill: 3     Allena Hamilton, MD

## 2024-07-15 ENCOUNTER — Ambulatory Visit: Admitting: *Deleted

## 2024-07-15 ENCOUNTER — Other Ambulatory Visit

## 2024-07-15 ENCOUNTER — Ambulatory Visit: Admitting: Internal Medicine

## 2024-07-15 DIAGNOSIS — E1165 Type 2 diabetes mellitus with hyperglycemia: Secondary | ICD-10-CM

## 2024-07-15 NOTE — Progress Notes (Signed)
 Teaching given on Ozempic injections and the patient expresses understanding and acceptance of instructions.   No injection was given only our demo pen was used. Patient did not have pens with him & wanted to start his injection on Saturday

## 2024-07-24 ENCOUNTER — Encounter: Payer: Self-pay | Admitting: Internal Medicine

## 2024-07-24 NOTE — Assessment & Plan Note (Signed)
 On lipitor.  Low cholesterol diet and exercise.  Follow lipid panel and liver function tests.  No change in medication today.

## 2024-07-24 NOTE — Assessment & Plan Note (Signed)
 On losartan /hctz, coreg  and amlodipine  10mg  q day.  Follow pressures.  Follow metabolic panel. Blood pressures as outlined. Recheck by me 128/78. No changes in medication today.

## 2024-07-24 NOTE — Assessment & Plan Note (Signed)
 If watches diet and takes PPI, upper symptoms controlled. Follow.

## 2024-07-24 NOTE — Assessment & Plan Note (Signed)
 Blood sugars as outlined. A1c elevated last check 7.9. continue diet and exercise. Has decreased soft drinks. Discussed other treatment options. Will start ozempic .25mg  q day. Discussed possible side effects of medication. Stay hydrated. Adequate protein intake. Follow.

## 2024-07-24 NOTE — Assessment & Plan Note (Signed)
Check cbc next labs.

## 2024-07-24 NOTE — Assessment & Plan Note (Signed)
 Very mild OSA on previous HST with AHI 5.2. saw pulmonary and they discussed treatment options - including oral appliance, CPAP therapy. Opted for side sleeping position. Discussed diet and exercise today. Discussed weight loss and GLP 1 agonist. Agreeable to start ozempic.

## 2024-08-17 ENCOUNTER — Telehealth: Payer: Self-pay | Admitting: Internal Medicine

## 2024-08-17 NOTE — Telephone Encounter (Signed)
 Pt dropped off list of BP and sugar readings. They're in the color folder up front.

## 2024-08-17 NOTE — Telephone Encounter (Signed)
 Placed in folder

## 2024-08-17 NOTE — Telephone Encounter (Signed)
 Reviewed blood pressures and sugars. Sugars appear to be doing well. Blood pressures varying, but overall ok - averaging 110-130/70-80s. Continue to monitor. Continue current medications.

## 2024-08-18 NOTE — Telephone Encounter (Signed)
 Last read by Jama JONELLE Mardy Mickey. at 10:01AM on 08/18/2024.

## 2024-08-29 ENCOUNTER — Other Ambulatory Visit: Payer: Self-pay | Admitting: Internal Medicine

## 2024-09-09 ENCOUNTER — Ambulatory Visit: Payer: Self-pay

## 2024-09-09 ENCOUNTER — Encounter: Payer: Self-pay | Admitting: Internal Medicine

## 2024-09-09 NOTE — Telephone Encounter (Signed)
 This RN made third attempt to contact pt with no answer. This RN left a voicemail with call back number provided.

## 2024-09-09 NOTE — Telephone Encounter (Signed)
2nd attempt. Left message.

## 2024-09-09 NOTE — Telephone Encounter (Signed)
 1st attempt unsuccessful, left voicemail requesting call back.  Copied from CRM #[id number]. Topic: Clinical - Red Word Triage >> Sep 09, 2024  2:59 PM Rea ORN wrote: Red Word that prompted transfer to Nurse Triage: pt spouse stated he feels like his throat is swollen. They wonder if it is an allergic reaction to ozempic . Pt had a rash with ozempic  before.

## 2024-09-12 NOTE — Telephone Encounter (Signed)
 Even though symptoms improved, I would like for him to be evaluated. See if any openings today or tomorrow where he can be seen acutely and we can d/u after.

## 2024-09-12 NOTE — Telephone Encounter (Signed)
 See my chart message

## 2024-09-12 NOTE — Telephone Encounter (Signed)
 I called patient & also notified him that mychart messages with new symptoms are highly discouraged. However, pt called the office several times prior to sending mychart.  I called patient this morning & the swallowing issue has resolved. He still has some hive/rash on his face but it is better since using Calamine lotion. The swallowing difficulty lasted about 2 days. He tried to take his meds on Friday and threw them back up.  Pt mentioned that this happened in the past with Jardiance .  Pt denies any swallowing issues today & he did take his Ozempic  on Staurday. He was not seen anywhere over the weekend.

## 2024-09-13 ENCOUNTER — Ambulatory Visit (INDEPENDENT_AMBULATORY_CARE_PROVIDER_SITE_OTHER): Admitting: Family

## 2024-09-13 ENCOUNTER — Encounter: Payer: Self-pay | Admitting: Family

## 2024-09-13 VITALS — BP 118/78 | HR 92 | Temp 98.4°F | Ht 70.0 in | Wt 234.6 lb

## 2024-09-13 DIAGNOSIS — T7840XA Allergy, unspecified, initial encounter: Secondary | ICD-10-CM

## 2024-09-13 DIAGNOSIS — R21 Rash and other nonspecific skin eruption: Secondary | ICD-10-CM | POA: Diagnosis not present

## 2024-09-13 DIAGNOSIS — R221 Localized swelling, mass and lump, neck: Secondary | ICD-10-CM

## 2024-09-13 NOTE — Patient Instructions (Signed)
 Hold Ozempic  for now.  If symptoms resolve we will discontinue it completely.  Follow-up with PCP next month to determine next steps.  For now, the goal is to see if your symptoms resolve completely being off of Ozempic 

## 2024-09-13 NOTE — Progress Notes (Signed)
 Acute Office Visit  Subjective:     Patient ID: Aaron Shew., male    DOB: September 23, 1966, 58 y.o.   MRN: [id number]  No chief complaint on file.   HPI Patient is in today with concerns of potentially having a reaction to Ozempic .  Patient reports that he has been on the medication for about 8 weeks now and tolerated it well.  However, he recently developed a rash on his face about 2 weeks ago and has had some difficulty swallowing his pills.  He is unsure if it is related to Ozempic  but is concerned that it is.  He has previously had a reaction to Jardiance  that caused throat swelling.  Last dose of Ozempic  was on Saturday, December 13.  The rash on his face is itchy and he has been applying aloe that has helped some.  Denies any shortness of breath or trouble breathing.  Review of Systems  Constitutional: Negative.   HENT:         Difficulty swallowing pills  Respiratory: Negative.  Negative for shortness of breath and wheezing.   Cardiovascular: Negative.   Musculoskeletal: Negative.   Skin:  Positive for itching and rash.  Neurological: Negative.   Endo/Heme/Allergies: Negative.   Psychiatric/Behavioral: Negative.    All other systems reviewed and are negative.  Past Medical History:  Diagnosis Date   GERD (gastroesophageal reflux disease)    Hypercholesterolemia    Hyperglycemia    Hypertension    Iron deficiency anemia     Social History   Socioeconomic History   Marital status: Married    Spouse name: Not on file   Number of children: 2   Years of education: Not on file   Highest education level: Not on file  Occupational History   Not on file  Tobacco Use   Smoking status: Never   Smokeless tobacco: Never  Vaping Use   Vaping status: Never Used  Substance and Sexual Activity   Alcohol use: No    Alcohol/week: 0.0 standard drinks of alcohol   Drug use: No   Sexual activity: Not on file  Other Topics Concern   Not on file  Social  History Narrative   ** Merged History Encounter **       Social Drivers of Health   Tobacco Use: Low Risk (09/13/2024)   Patient History    Smoking Tobacco Use: Never    Smokeless Tobacco Use: Never    Passive Exposure: Not on file  Financial Resource Strain: Not on file  Food Insecurity: Not on file  Transportation Needs: Not on file  Physical Activity: Not on file  Stress: Not on file  Social Connections: Not on file  Intimate Partner Violence: Not on file  Depression (PHQ2-9): Low Risk (09/13/2024)   Depression (PHQ2-9)    PHQ-2 Score: 0  Alcohol Screen: Not on file  Housing: Not on file  Utilities: Not on file  Health Literacy: Not on file    Past Surgical History:  Procedure Laterality Date   INGUINAL HERNIA REPAIR      Family History  Problem Relation Age of Onset   Hypertension Mother    Hypertension Father    CVA Paternal Grandfather    Colon cancer Neg Hx    Prostate cancer Neg Hx     Allergies[1]  Medications Ordered Prior to Encounter[2]  BP 118/78   Pulse 92   Temp 98.4 F (36.9 C)   Ht 5' 10 (1.778 m)  Wt 234 lb 9.6 oz (106.4 kg)   SpO2 97%   BMI 33.66 kg/m chart      Objective:    There were no vitals taken for this visit.   Physical Exam Vitals and nursing note reviewed.  Constitutional:      Appearance: Normal appearance. He is normal weight.  HENT:     Right Ear: Tympanic membrane, ear canal and external ear normal.     Left Ear: Tympanic membrane, ear canal and external ear normal.     Mouth/Throat:     Mouth: Mucous membranes are moist.     Pharynx: Oropharynx is clear. No oropharyngeal exudate or posterior oropharyngeal erythema.     Comments: No obvious swelling Cardiovascular:     Rate and Rhythm: Normal rate. Rhythm irregular.  Pulmonary:     Effort: Pulmonary effort is normal.     Breath sounds: Normal breath sounds.  Musculoskeletal:     Cervical back: Normal range of motion and neck supple.  Skin:     Findings: Rash present.     Comments: Papular, patchy rash noted to 4 areas of his face, mild erythema appears to be resolving.  Neurological:     Mental Status: He is alert. He is disoriented.  Psychiatric:        Mood and Affect: Mood normal.        Behavior: Behavior normal.        Thought Content: Thought content normal.    No results found for any visits on 09/13/24.      Assessment & Plan:   Problem List Items Addressed This Visit     Allergic reaction - Primary   Other Visit Diagnoses       Throat swelling         Facial rash           No orders of the defined types were placed in this encounter.  For now, we will hold Ozempic  to see if his symptoms resolve.  If they do discussed with patient that he will need to follow-up with PCP next month as scheduled to determine next steps.  A thin layer of hydrocortisone cream to the rash on his face x 5 days only.  Call the office if symptoms worsen or persist.  Recheck as scheduled and sooner as needed. No follow-ups on file.  Bishop Vanderwerf B Lashandra Arauz, FNP       [1] Allergies Allergen Reactions   Jardiance  [Empagliflozin ] Swelling    Per pt made throat swell   Ozempic  (0.25 Or 0.5 Mg-Dose) [Semaglutide (0.25 Or 0.5mg -Dos)] Swelling    Possible throat swelling; unable to swallow pills for a few days  [2] Current Outpatient Medications on File Prior to Visit  Medication Sig Dispense Refill   amLODipine  (NORVASC ) 10 MG tablet TAKE 1 TABLET DAILY 90 tablet 3   aspirin 81 MG tablet Take 81 mg by mouth daily.     atorvastatin  (LIPITOR) 40 MG tablet TAKE 1 TABLET DAILY 90 tablet 3   Blood Glucose Monitoring Suppl DEVI Use to check blood sugar bid 1 each 0   carvedilol  (COREG ) 12.5 MG tablet Take 1 tablet (12.5 mg total) by mouth 2 (two) times daily with a meal. 180 tablet 3   Glucose Blood (BLOOD GLUCOSE TEST STRIPS) STRP Use to check sugars bid 100 strip 5   Lancet Device MISC Use to check blood sugar bid 1 each 0    Lancets Misc. MISC Use to check blood sugars bid 100 each  5   losartan -hydrochlorothiazide (HYZAAR) 100-12.5 MG tablet Take 1 tablet by mouth daily. 90 tablet 3   meloxicam  (MOBIC ) 15 MG tablet Take 1 tablet (15 mg total) by mouth daily as needed for pain. 30 tablet 0   metFORMIN  (GLUCOPHAGE -XR) 500 MG 24 hr tablet TAKE 2 TABLETS TWICE A DAY 360 tablet 3   mupirocin  ointment (BACTROBAN ) 2 % Apply 1 Application topically 2 (two) times daily. 22 g 0   omeprazole  (PRILOSEC) 40 MG capsule TAKE 1 CAPSULE DAILY 90 capsule 3   tamsulosin  (FLOMAX ) 0.4 MG CAPS capsule TAKE 1 CAPSULE DAILY AFTER SUPPER 90 capsule 3   No current facility-administered medications on file prior to visit.

## 2024-09-14 ENCOUNTER — Other Ambulatory Visit: Payer: Self-pay | Admitting: Internal Medicine

## 2024-09-15 ENCOUNTER — Encounter: Payer: Self-pay | Admitting: Internal Medicine

## 2024-09-16 NOTE — Telephone Encounter (Signed)
 Confirm if he is still taking metformin .  Also, see if he can check and record sugars over the next few days and send in readings.  If so, then can see what adjustments are needed in his medication.  Also, see if he has glipizide  at home and if so, confirm the dose of medication he has.

## 2024-09-19 NOTE — Telephone Encounter (Signed)
 Please call and have him check and record sugars and send in some readings over the next week. May start glipizide  after reviewing.

## 2024-10-04 ENCOUNTER — Telehealth: Payer: Self-pay | Admitting: Internal Medicine

## 2024-10-04 NOTE — Telephone Encounter (Signed)
 Pt came in and dropped off blood pressure readings

## 2024-10-04 NOTE — Telephone Encounter (Signed)
 Reviewed blood pressures. Blood pressures averaging 110-130/70-80. Blood pressure are ok. Continue to monitor. Let us  know if any problems.

## 2024-10-04 NOTE — Telephone Encounter (Signed)
 LMTCB. Please relay Dr. Freda message below to pt when he returns call to the office.

## 2024-10-05 ENCOUNTER — Telehealth: Payer: Self-pay

## 2024-10-05 NOTE — Telephone Encounter (Signed)
 Brena Manfred CROME, RN    10/05/24  8:47 AM Unsigned Note Copied from CRM [id number]. Topic: Clinical - Medication Question >> Oct 05, 2024  8:35 AM Mesmerise C wrote: Reason for CRM: Patient returning a call from clinic read message verbatim but is inquiring if he should be taking glipiZIDE  (GLUCOTROL  XL) 2.5 MG 24 hr tablet again would like a call back at  [phone]

## 2024-10-05 NOTE — Telephone Encounter (Signed)
 Per review of chart, he saw Padonda and his ozempic  was stopped. If he did not have any problem with glipizide , I would like for him to restart and check his sugars and send in readings over the next few weeks.

## 2024-10-05 NOTE — Telephone Encounter (Signed)
 Copied from CRM [id number]. Topic: Clinical - Medication Question >> Oct 05, 2024  8:35 AM Mesmerise C wrote: Reason for CRM: Patient returning a call from clinic read message verbatim but is inquiring if he should be taking glipiZIDE  (GLUCOTROL  XL) 2.5 MG 24 hr tablet again would like a call back at  [phone]

## 2024-10-05 NOTE — Telephone Encounter (Signed)
 Pt's wife teresa(listed on dpr) was notified. Wife stated she would inform pt when he returns.

## 2024-10-05 NOTE — Telephone Encounter (Signed)
"  See other telephone encounter.   "

## 2024-10-10 ENCOUNTER — Other Ambulatory Visit: Payer: Self-pay | Admitting: Internal Medicine

## 2024-10-13 ENCOUNTER — Ambulatory Visit: Payer: Self-pay | Admitting: Internal Medicine

## 2024-10-13 ENCOUNTER — Other Ambulatory Visit: Payer: Self-pay | Admitting: Internal Medicine

## 2024-10-13 ENCOUNTER — Other Ambulatory Visit

## 2024-10-13 DIAGNOSIS — E1165 Type 2 diabetes mellitus with hyperglycemia: Secondary | ICD-10-CM | POA: Diagnosis not present

## 2024-10-13 DIAGNOSIS — E7800 Pure hypercholesterolemia, unspecified: Secondary | ICD-10-CM

## 2024-10-13 DIAGNOSIS — D649 Anemia, unspecified: Secondary | ICD-10-CM | POA: Diagnosis not present

## 2024-10-13 LAB — CBC WITH DIFFERENTIAL/PLATELET
Basophils Absolute: 0 K/uL (ref 0.0–0.1)
Basophils Relative: 0.5 % (ref 0.0–3.0)
Eosinophils Absolute: 0.2 K/uL (ref 0.0–0.7)
Eosinophils Relative: 2.2 % (ref 0.0–5.0)
HCT: 37.8 % — ABNORMAL LOW (ref 39.0–52.0)
Hemoglobin: 13 g/dL (ref 13.0–17.0)
Lymphocytes Relative: 27.4 % (ref 12.0–46.0)
Lymphs Abs: 2.4 K/uL (ref 0.7–4.0)
MCHC: 34.3 g/dL (ref 30.0–36.0)
MCV: 84.4 fl (ref 78.0–100.0)
Monocytes Absolute: 0.6 K/uL (ref 0.1–1.0)
Monocytes Relative: 6.7 % (ref 3.0–12.0)
Neutro Abs: 5.6 K/uL (ref 1.4–7.7)
Neutrophils Relative %: 63.2 % (ref 43.0–77.0)
Platelets: 311 K/uL (ref 150.0–400.0)
RBC: 4.48 Mil/uL (ref 4.22–5.81)
RDW: 14.3 % (ref 11.5–15.5)
WBC: 8.8 K/uL (ref 4.0–10.5)

## 2024-10-13 LAB — HEPATIC FUNCTION PANEL
ALT: 12 U/L (ref 3–53)
AST: 13 U/L (ref 5–37)
Albumin: 4.1 g/dL (ref 3.5–5.2)
Alkaline Phosphatase: 72 U/L (ref 39–117)
Bilirubin, Direct: 0.2 mg/dL (ref 0.1–0.3)
Total Bilirubin: 0.7 mg/dL (ref 0.2–1.2)
Total Protein: 7 g/dL (ref 6.0–8.3)

## 2024-10-13 LAB — BASIC METABOLIC PANEL WITH GFR
BUN: 16 mg/dL (ref 6–23)
CO2: 31 meq/L (ref 19–32)
Calcium: 8.9 mg/dL (ref 8.4–10.5)
Chloride: 101 meq/L (ref 96–112)
Creatinine, Ser: 1.1 mg/dL (ref 0.40–1.50)
GFR: 73.51 mL/min
Glucose, Bld: 100 mg/dL — ABNORMAL HIGH (ref 70–99)
Potassium: 3.7 meq/L (ref 3.5–5.1)
Sodium: 137 meq/L (ref 135–145)

## 2024-10-13 LAB — LIPID PANEL
Cholesterol: 118 mg/dL (ref 28–200)
HDL: 38.1 mg/dL — ABNORMAL LOW
LDL Cholesterol: 62 mg/dL (ref 10–99)
NonHDL: 79.8
Total CHOL/HDL Ratio: 3
Triglycerides: 87 mg/dL (ref 10.0–149.0)
VLDL: 17.4 mg/dL (ref 0.0–40.0)

## 2024-10-13 LAB — HEMOGLOBIN A1C: Hgb A1c MFr Bld: 7.2 % — ABNORMAL HIGH (ref 4.6–6.5)

## 2024-10-17 ENCOUNTER — Encounter: Payer: Self-pay | Admitting: Internal Medicine

## 2024-10-17 ENCOUNTER — Ambulatory Visit: Admitting: Internal Medicine

## 2024-10-17 VITALS — BP 126/72 | HR 87 | Temp 97.8°F | Ht 70.0 in | Wt 233.8 lb

## 2024-10-17 DIAGNOSIS — Z125 Encounter for screening for malignant neoplasm of prostate: Secondary | ICD-10-CM | POA: Diagnosis not present

## 2024-10-17 DIAGNOSIS — Z7984 Long term (current) use of oral hypoglycemic drugs: Secondary | ICD-10-CM | POA: Diagnosis not present

## 2024-10-17 DIAGNOSIS — D649 Anemia, unspecified: Secondary | ICD-10-CM

## 2024-10-17 DIAGNOSIS — N400 Enlarged prostate without lower urinary tract symptoms: Secondary | ICD-10-CM

## 2024-10-17 DIAGNOSIS — K219 Gastro-esophageal reflux disease without esophagitis: Secondary | ICD-10-CM

## 2024-10-17 DIAGNOSIS — I10 Essential (primary) hypertension: Secondary | ICD-10-CM | POA: Diagnosis not present

## 2024-10-17 DIAGNOSIS — E1165 Type 2 diabetes mellitus with hyperglycemia: Secondary | ICD-10-CM

## 2024-10-17 DIAGNOSIS — E7800 Pure hypercholesterolemia, unspecified: Secondary | ICD-10-CM

## 2024-10-17 MED ORDER — CARVEDILOL 12.5 MG PO TABS: 12.5000 mg | ORAL_TABLET | Freq: Two times a day (BID) | ORAL | 3 refills | Status: AC

## 2024-10-17 MED ORDER — METFORMIN HCL ER 500 MG PO TB24: 1000.0000 mg | ORAL_TABLET | Freq: Two times a day (BID) | ORAL | 3 refills | Status: AC

## 2024-10-17 NOTE — Progress Notes (Signed)
 "  Subjective:    Patient ID: Aaron Allison., male    DOB: [birth year], 59 y.o.   MRN: [id number]  Patient here for  Chief Complaint  Patient presents with   Medical Management of Chronic Issues    HPI Here for a scheduled follow up -  f/u regarding diabetes, hypertension and hypercholesterolemia. On amlodipine  10mg  q day. Continues on carvedilol  and losartan /hydrochlorothiazide. On metformin  XR. Unable to take jardiance  (reaction). On glipizide . Last visit, started ozempic . Noticed medication handing ins his throat. Then developed a rash on his cheek.  Was concerned was related to ozempic . Stopped the medication. No problems since. Discussed recent labs. Reports sugars are doing ok. PM sugars remaining less than 200. Also reports some issues with occasional decreased urine flow/stream. Some issues with ED. Has previously seen Dr Twylla. Discussed referral back. No increased nocturia. No hematuria.    Past Medical History:  Diagnosis Date   GERD (gastroesophageal reflux disease)    Hypercholesterolemia    Hyperglycemia    Hypertension    Iron deficiency anemia    Past Surgical History:  Procedure Laterality Date   INGUINAL HERNIA REPAIR     Family History  Problem Relation Age of Onset   Hypertension Mother    Hypertension Father    CVA Paternal Grandfather    Colon cancer Neg Hx    Prostate cancer Neg Hx    Social History   Socioeconomic History   Marital status: Married    Spouse name: Not on file   Number of children: 2   Years of education: Not on file   Highest education level: Not on file  Occupational History   Not on file  Tobacco Use   Smoking status: Never   Smokeless tobacco: Never  Vaping Use   Vaping status: Never Used  Substance and Sexual Activity   Alcohol use: No    Alcohol/week: 0.0 standard drinks of alcohol   Drug use: No   Sexual activity: Not on file  Other Topics Concern   Not on file  Social History Narrative   ** Merged History  Encounter **       Social Drivers of Health   Tobacco Use: Low Risk (10/17/2024)   Patient History    Smoking Tobacco Use: Never    Smokeless Tobacco Use: Never    Passive Exposure: Not on file  Financial Resource Strain: Not on file  Food Insecurity: Not on file  Transportation Needs: Not on file  Physical Activity: Not on file  Stress: Not on file  Social Connections: Not on file  Depression (PHQ2-9): Low Risk (10/17/2024)   Depression (PHQ2-9)    PHQ-2 Score: 0  Alcohol Screen: Not on file  Housing: Not on file  Utilities: Not on file  Health Literacy: Not on file     Review of Systems  Constitutional:  Negative for appetite change and unexpected weight change.  HENT:  Negative for congestion and sinus pressure.   Respiratory:  Negative for cough, chest tightness and shortness of breath.   Cardiovascular:  Negative for chest pain, palpitations and leg swelling.  Gastrointestinal:  Negative for abdominal pain, diarrhea, nausea and vomiting.  Genitourinary:        Some decrease in urine stream. No hematuria. Reports some erectile dysfunction. Difficult at times to maintain an erection.   Musculoskeletal:  Negative for joint swelling and myalgias.  Skin:  Negative for color change and rash.  Neurological:  Negative for dizziness and headaches.  Psychiatric/Behavioral:  Negative for agitation and dysphoric mood.        Objective:     BP 126/72   Pulse 87   Temp 97.8 F (36.6 C) (Oral)   Ht 5' 10 (1.778 m)   Wt 233 lb 12.8 oz (106.1 kg)   SpO2 97%   BMI 33.55 kg/m  Wt Readings from Last 3 Encounters:  10/17/24 233 lb 12.8 oz (106.1 kg)  09/13/24 234 lb 9.6 oz (106.4 kg)  07/14/24 252 lb 4 oz (114.4 kg)    Physical Exam Vitals reviewed.  Constitutional:      General: He is not in acute distress.    Appearance: Normal appearance. He is well-developed.  HENT:     Head: Normocephalic and atraumatic.     Right Ear: External ear normal.     Left Ear: External  ear normal.     Mouth/Throat:     Pharynx: No oropharyngeal exudate or posterior oropharyngeal erythema.  Eyes:     General: No scleral icterus.       Right eye: No discharge.        Left eye: No discharge.     Conjunctiva/sclera: Conjunctivae normal.  Cardiovascular:     Rate and Rhythm: Normal rate and regular rhythm.  Pulmonary:     Effort: Pulmonary effort is normal. No respiratory distress.     Breath sounds: Normal breath sounds.  Abdominal:     General: Bowel sounds are normal.     Palpations: Abdomen is soft.     Tenderness: There is no abdominal tenderness.  Genitourinary:    Comments: Rectal exam - no nodules apprecisted.  Musculoskeletal:        General: No swelling or tenderness.     Cervical back: Neck supple. No tenderness.  Lymphadenopathy:     Cervical: No cervical adenopathy.  Skin:    Findings: No erythema or rash.  Neurological:     Mental Status: He is alert.  Psychiatric:        Mood and Affect: Mood normal.        Behavior: Behavior normal.         Outpatient Encounter Medications as of 10/17/2024  Medication Sig   amLODipine  (NORVASC ) 10 MG tablet TAKE 1 TABLET DAILY   aspirin 81 MG tablet Take 81 mg by mouth daily.   atorvastatin  (LIPITOR) 40 MG tablet TAKE 1 TABLET DAILY   Blood Glucose Monitoring Suppl DEVI Use to check blood sugar bid   glipiZIDE  (GLUCOTROL  XL) 2.5 MG 24 hr tablet Take 2.5 mg by mouth daily.   Glucose Blood (BLOOD GLUCOSE TEST STRIPS) STRP Use to check sugars bid   Lancet Device MISC Use to check blood sugar bid   Lancets Misc. MISC Use to check blood sugars bid   losartan -hydrochlorothiazide (HYZAAR) 100-12.5 MG tablet TAKE 1 TABLET DAILY   meloxicam  (MOBIC ) 15 MG tablet Take 1 tablet (15 mg total) by mouth daily as needed for pain.   mupirocin  ointment (BACTROBAN ) 2 % Apply 1 Application topically 2 (two) times daily.   omeprazole  (PRILOSEC) 40 MG capsule TAKE 1 CAPSULE DAILY   tamsulosin  (FLOMAX ) 0.4 MG CAPS capsule TAKE  1 CAPSULE DAILY AFTER SUPPER   carvedilol  (COREG ) 12.5 MG tablet Take 1 tablet (12.5 mg total) by mouth 2 (two) times daily with a meal.   metFORMIN  (GLUCOPHAGE -XR) 500 MG 24 hr tablet Take 2 tablets (1,000 mg total) by mouth 2 (two) times daily.   [DISCONTINUED] carvedilol  (COREG ) 12.5 MG tablet  Take 1 tablet (12.5 mg total) by mouth 2 (two) times daily with a meal.   [DISCONTINUED] metFORMIN  (GLUCOPHAGE -XR) 500 MG 24 hr tablet TAKE 2 TABLETS TWICE A DAY   No facility-administered encounter medications on file as of 10/17/2024.     Lab Results  Component Value Date   WBC 8.8 10/13/2024   HGB 13.0 10/13/2024   HCT 37.8 (L) 10/13/2024   PLT 311.0 10/13/2024   GLUCOSE 100 (H) 10/13/2024   CHOL 118 10/13/2024   TRIG 87.0 10/13/2024   HDL 38.10 (L) 10/13/2024   LDLCALC 62 10/13/2024   ALT 12 10/13/2024   AST 13 10/13/2024   NA 137 10/13/2024   K 3.7 10/13/2024   CL 101 10/13/2024   CREATININE 1.10 10/13/2024   BUN 16 10/13/2024   CO2 31 10/13/2024   TSH 2.43 07/03/2023   PSA 2.49 10/21/2023   HGBA1C 7.2 (H) 10/13/2024   MICROALBUR <0.7 06/23/2024       Assessment & Plan:  Type 2 diabetes mellitus with hyperglycemia, without long-term current use of insulin (HCC) Assessment & Plan: Has had reaction to jardiance  and ozempic  as outlined. Continues on metformin . On glipizide  2.5mg  q day now. States pm sugars remaining less than 200. Low carb diet and exercise. Send in sugar readings. Follow met b and A1c. Recent A1c improved 7.2.   Orders: -     Hemoglobin A1c; Future  Hypercholesterolemia Assessment & Plan: On lipitor.  Low cholesterol diet and exercise.  Follow lipid panel.  Lab Results  Component Value Date   CHOL 118 10/13/2024   HDL 38.10 (L) 10/13/2024   LDLCALC 62 10/13/2024   TRIG 87.0 10/13/2024   CHOLHDL 3 10/13/2024     Orders: -     Hepatic function panel; Future -     Lipid panel; Future  Anemia, unspecified type Assessment & Plan: Follow cbc. Recent  hgb 10/13/24 - wnl.   Orders: -     Basic metabolic panel with GFR; Future -     CBC with Differential/Platelet; Future  Prostate cancer screening -     PSA; Future  Enlarged prostate Assessment & Plan: Has a history of enlarged prostate. Is s/p biopsy previously. With notice of intermittent urine stream and problems with obtaining and sustaining an erection. Has seen Dr Twylla previously. Discussed referral back.   Orders: -     Ambulatory referral to Urology  Gastroesophageal reflux disease, unspecified whether esophagitis present Assessment & Plan: No upper symptoms reported. Continue prilosec.    Primary hypertension Assessment & Plan: On losartan /hctz, coreg  and amlodipine  10mg  q day.  Follow pressures.  Follow metabolic panel. Blood pressures as outlined. Recheck by me 126/72. No change in medication.    Other orders -     Carvedilol ; Take 1 tablet (12.5 mg total) by mouth 2 (two) times daily with a meal.  Dispense: 180 tablet; Refill: 3 -     metFORMIN  HCl ER; Take 2 tablets (1,000 mg total) by mouth 2 (two) times daily.  Dispense: 360 tablet; Refill: 3     Allena Hamilton, MD "

## 2024-10-22 ENCOUNTER — Encounter: Payer: Self-pay | Admitting: Internal Medicine

## 2024-10-22 NOTE — Assessment & Plan Note (Signed)
No upper symptoms reported.  Continue prilosec.  

## 2024-10-22 NOTE — Assessment & Plan Note (Signed)
 Has a history of enlarged prostate. Is s/p biopsy previously. With notice of intermittent urine stream and problems with obtaining and sustaining an erection. Has seen Dr Twylla previously. Discussed referral back.

## 2024-10-22 NOTE — Assessment & Plan Note (Signed)
 On losartan /hctz, coreg  and amlodipine  10mg  q day.  Follow pressures.  Follow metabolic panel. Blood pressures as outlined. Recheck by me 126/72. No change in medication.

## 2024-10-22 NOTE — Assessment & Plan Note (Signed)
 Follow cbc. Recent hgb 10/13/24 - wnl.

## 2024-10-22 NOTE — Assessment & Plan Note (Signed)
 On lipitor.  Low cholesterol diet and exercise.  Follow lipid panel.  Lab Results  Component Value Date   CHOL 118 10/13/2024   HDL 38.10 (L) 10/13/2024   LDLCALC 62 10/13/2024   TRIG 87.0 10/13/2024   CHOLHDL 3 10/13/2024

## 2024-10-22 NOTE — Assessment & Plan Note (Signed)
 Has had reaction to jardiance  and ozempic  as outlined. Continues on metformin . On glipizide  2.5mg  q day now. States pm sugars remaining less than 200. Low carb diet and exercise. Send in sugar readings. Follow met b and A1c. Recent A1c improved 7.2.

## 2024-10-24 ENCOUNTER — Telehealth: Payer: Self-pay

## 2024-10-24 NOTE — Telephone Encounter (Signed)
 Copied from CRM #[id number]. Topic: Clinical - Prescription Issue >> Oct 24, 2024 12:48 PM Macario HERO wrote: Reason for CRM: Patient spouse called said [facility] sent a request for patient omeprazole  (PRILOSEC) 40 MG capsule [[id number]] and they have not received it since 1/15. Patient is running low.

## 2024-10-25 MED ORDER — OMEPRAZOLE 40 MG PO CPDR: 40.0000 mg | DELAYED_RELEASE_CAPSULE | Freq: Every day | ORAL | 3 refills | Status: AC

## 2024-10-25 NOTE — Addendum Note (Signed)
 Addended by: Emiya Loomer on: 10/25/2024 01:13 PM   Modules accepted: Orders

## 2024-10-25 NOTE — Telephone Encounter (Signed)
Prescription resent.  Pt's wife notified

## 2024-10-28 ENCOUNTER — Ambulatory Visit: Admitting: Urology

## 2024-10-28 ENCOUNTER — Encounter: Payer: Self-pay | Admitting: Urology

## 2024-10-28 VITALS — BP 121/77 | HR 83 | Ht 70.0 in | Wt 232.0 lb

## 2024-10-28 DIAGNOSIS — N529 Male erectile dysfunction, unspecified: Secondary | ICD-10-CM | POA: Diagnosis not present

## 2024-10-28 DIAGNOSIS — N401 Enlarged prostate with lower urinary tract symptoms: Secondary | ICD-10-CM | POA: Diagnosis not present

## 2024-10-28 DIAGNOSIS — R3912 Poor urinary stream: Secondary | ICD-10-CM | POA: Diagnosis not present

## 2024-10-28 DIAGNOSIS — R972 Elevated prostate specific antigen [PSA]: Secondary | ICD-10-CM | POA: Diagnosis not present

## 2024-10-28 LAB — BLADDER SCAN AMB NON-IMAGING: Scan Result: 0

## 2024-10-28 MED ORDER — TADALAFIL 20 MG PO TABS: ORAL_TABLET | ORAL | 0 refills | Status: AC

## 2024-10-28 MED ORDER — TAMSULOSIN HCL 0.4 MG PO CAPS: 0.4000 mg | ORAL_CAPSULE | Freq: Two times a day (BID) | ORAL | 0 refills | Status: AC

## 2024-10-28 NOTE — Progress Notes (Unsigned)
 "  10/28/2024 8:58 AM   Jama JONELLE Mardy Teddie [birth year] [id number]  Referring provider: Glendia Shad, MD [address] [place],  [place] [address]  Chief Complaint  Patient presents with   Elevated PSA    HPI: Cambell Stanek. is a 59 y.o. male referred for evaluation of BPH and erectile dysfunction.  Long history of BPH on tamsulosin .  I had seen him when I was at [facility] in 2014 and he saw 2 additional providers in 2019 Complains of intermittent decreased force and caliber of his urinary stream.  This typically occurs every 2-3 weeks.  It can be either in the daytime or evening.  No dysuria or gross hematuria No flank, abdominal or pelvic pain Also has had difficulty maintaining an erection.  He does achieve a firm erection but will lose prior to orgasm Organ at risk factors include hypertension and hypercholesterolemia, diabetes and antihypertensive medications   PMH: Past Medical History:  Diagnosis Date   GERD (gastroesophageal reflux disease)    Hypercholesterolemia    Hyperglycemia    Hypertension    Iron deficiency anemia     Surgical History: Past Surgical History:  Procedure Laterality Date   INGUINAL HERNIA REPAIR      Home Medications:  Allergies as of 10/28/2024       Reactions   Jardiance  [empagliflozin ] Swelling   Per pt made throat swell   Ozempic  (0.25 Or 0.5 Mg-dose) [semaglutide (0.25 Or 0.5mg -dos)] Swelling   Possible throat swelling; unable to swallow pills for a few days        Medication List        Accurate as of October 28, 2024  8:58 AM. If you have any questions, ask your nurse or doctor.          amLODipine  10 MG tablet Commonly known as: NORVASC  TAKE 1 TABLET DAILY   aspirin 81 MG tablet Take 81 mg by mouth daily.   atorvastatin  40 MG tablet Commonly known as: LIPITOR TAKE 1 TABLET DAILY   Blood Glucose Monitoring Suppl Devi Use to check blood sugar bid   BLOOD GLUCOSE TEST STRIPS Strp Use to check  sugars bid   carvedilol  12.5 MG tablet Commonly known as: COREG  Take 1 tablet (12.5 mg total) by mouth 2 (two) times daily with a meal.   glipiZIDE  2.5 MG 24 hr tablet Commonly known as: GLUCOTROL  XL Take 2.5 mg by mouth daily.   Lancet Device Misc Use to check blood sugar bid   Lancets Misc. Misc Use to check blood sugars bid   losartan -hydrochlorothiazide 100-12.5 MG tablet Commonly known as: HYZAAR TAKE 1 TABLET DAILY   meloxicam  15 MG tablet Commonly known as: MOBIC  Take 1 tablet (15 mg total) by mouth daily as needed for pain.   metFORMIN  500 MG 24 hr tablet Commonly known as: GLUCOPHAGE -XR Take 2 tablets (1,000 mg total) by mouth 2 (two) times daily.   mupirocin  ointment 2 % Commonly known as: BACTROBAN  Apply 1 Application topically 2 (two) times daily.   omeprazole  40 MG capsule Commonly known as: PRILOSEC Take 1 capsule (40 mg total) by mouth daily.   tadalafil  20 MG tablet Commonly known as: CIALIS  1 by mouth 1 hour prior to intercourse Started by: Glendia Barba, MD   tamsulosin  0.4 MG Caps capsule Commonly known as: FLOMAX  TAKE 1 CAPSULE DAILY AFTER SUPPER What changed: Another medication with the same name was added. Make sure you understand how and when to take each. Changed by: Glendia  Princess Karnes, MD   tamsulosin  0.4 MG Caps capsule Commonly known as: FLOMAX  Take 1 capsule (0.4 mg total) by mouth 2 (two) times daily. What changed: You were already taking a medication with the same name, and this prescription was added. Make sure you understand how and when to take each. Changed by: Glendia Barba, MD        Allergies: Allergies[1]  Family History: Family History  Problem Relation Age of Onset   Hypertension Mother    Hypertension Father    CVA Paternal Grandfather    Colon cancer Neg Hx    Prostate cancer Neg Hx     Social History:  reports that he has never smoked. He has never used smokeless tobacco. He reports that he does not drink  alcohol and does not use drugs.   Physical Exam: BP (!) 153/88   Pulse 83   Ht 5' 10 (1.778 m)   Wt 232 lb (105.2 kg)   SpO2 98%   BMI 33.29 kg/m   Constitutional:  Alert, No acute distress. HEENT: [place] AT Respiratory: Normal respiratory effort, no increased work of breathing. Psychiatric: Normal mood and affect.   Assessment & Plan:    1.  BPH with LUTS Intermittent obstructive voiding symptoms.  We discussed potential etiologies including bladder overdistention and dietary triggers.  Discussed monitoring versus titrating tamsulosin  0.8 mg and he will try tamsulosin  titration for 30 days. PVR today was 0 mL  2.  Erectile dysfunction Has difficulty maintaining an erection. Trial tadalafil  20 mg 1 hour prior to intercourse If not effective we discussed possibility of venoocclusive disease and a venous compression band was discussed   Glendia JAYSON Barba, MD  [facility] [address] [place], [place] [address] [phone]     [1]  Allergies Allergen Reactions   Jardiance  [Empagliflozin ] Swelling    Per pt made throat swell   Ozempic  (0.25 Or 0.5 Mg-Dose) [Semaglutide (0.25 Or 0.5mg -Dos)] Swelling    Possible throat swelling; unable to swallow pills for a few days   "

## 2024-11-09 ENCOUNTER — Ambulatory Visit: Admitting: Urology

## 2025-01-23 ENCOUNTER — Other Ambulatory Visit

## 2025-01-25 ENCOUNTER — Ambulatory Visit: Admitting: Internal Medicine
